# Patient Record
Sex: Male | Born: 1935
Health system: Southern US, Community
[De-identification: ages and names within clinical notes are randomized; demographics above are authoritative.]

## PROBLEM LIST (undated history)

## (undated) DIAGNOSIS — E785 Hyperlipidemia, unspecified: Secondary | ICD-10-CM

## (undated) DIAGNOSIS — I4510 Unspecified right bundle-branch block: Secondary | ICD-10-CM

## (undated) DIAGNOSIS — N400 Enlarged prostate without lower urinary tract symptoms: Secondary | ICD-10-CM

## (undated) DIAGNOSIS — I77810 Thoracic aortic ectasia: Secondary | ICD-10-CM

## (undated) DIAGNOSIS — I341 Nonrheumatic mitral (valve) prolapse: Secondary | ICD-10-CM

## (undated) DIAGNOSIS — D1430 Benign neoplasm of unspecified bronchus and lung: Secondary | ICD-10-CM

## (undated) DIAGNOSIS — H269 Unspecified cataract: Secondary | ICD-10-CM

## (undated) DIAGNOSIS — R002 Palpitations: Secondary | ICD-10-CM

## (undated) DIAGNOSIS — E7800 Pure hypercholesterolemia, unspecified: Secondary | ICD-10-CM

## (undated) DIAGNOSIS — I429 Cardiomyopathy, unspecified: Secondary | ICD-10-CM

## (undated) HISTORY — DX: Benign prostatic hyperplasia without lower urinary tract symptoms: N40.0

## (undated) HISTORY — DX: Palpitations: R00.2

## (undated) HISTORY — DX: Unspecified right bundle-branch block: I45.10

## (undated) HISTORY — DX: Nonrheumatic mitral (valve) prolapse: I34.1

## (undated) HISTORY — DX: Thoracic aortic ectasia: I77.810

## (undated) HISTORY — DX: Unspecified cataract: H26.9

## (undated) HISTORY — PX: CATARACT EXTRACTION, BILATERAL: SHX1313

## (undated) HISTORY — PX: VASECTOMY: SHX75

## (undated) HISTORY — DX: Benign neoplasm of unspecified bronchus and lung: D14.30

## (undated) HISTORY — PX: COSMETIC SURGERY: SHX468

## (undated) HISTORY — PX: BLEPHAROPLASTY: SUR158

## (undated) HISTORY — DX: Pure hypercholesterolemia, unspecified: E78.00

## (undated) HISTORY — DX: Hyperlipidemia, unspecified: E78.5

## (undated) HISTORY — DX: Cardiomyopathy, unspecified: I42.9

## (undated) HISTORY — PX: OTHER SURGICAL HISTORY: SHX169

## (undated) HISTORY — PX: SHOULDER ARTHROSCOPY: SHX128

## (undated) HISTORY — PX: INGUINAL HERNIA REPAIR: SUR1180

---

## 2004-11-04 ENCOUNTER — Ambulatory Visit: Payer: Self-pay | Admitting: Cardiology

## 2004-11-10 ENCOUNTER — Ambulatory Visit: Payer: Self-pay | Admitting: Cardiology

## 2004-12-16 ENCOUNTER — Ambulatory Visit (HOSPITAL_COMMUNITY): Admission: RE | Admit: 2004-12-16 | Discharge: 2004-12-16 | Payer: Self-pay | Admitting: Surgery

## 2004-12-16 ENCOUNTER — Encounter (INDEPENDENT_AMBULATORY_CARE_PROVIDER_SITE_OTHER): Payer: Self-pay | Admitting: *Deleted

## 2005-01-16 ENCOUNTER — Emergency Department (HOSPITAL_COMMUNITY): Admission: EM | Admit: 2005-01-16 | Discharge: 2005-01-16 | Payer: Self-pay | Admitting: Family Medicine

## 2005-11-14 ENCOUNTER — Ambulatory Visit: Payer: Self-pay | Admitting: Cardiology

## 2005-12-26 ENCOUNTER — Encounter: Payer: Self-pay | Admitting: Cardiology

## 2005-12-26 ENCOUNTER — Ambulatory Visit: Payer: Self-pay | Admitting: Cardiology

## 2005-12-26 ENCOUNTER — Ambulatory Visit: Payer: Self-pay

## 2006-02-28 ENCOUNTER — Ambulatory Visit: Payer: Self-pay | Admitting: Cardiology

## 2006-05-01 ENCOUNTER — Ambulatory Visit: Payer: Self-pay | Admitting: Cardiology

## 2006-06-08 ENCOUNTER — Encounter: Admission: RE | Admit: 2006-06-08 | Discharge: 2006-06-08 | Payer: Self-pay | Admitting: Orthopaedic Surgery

## 2006-11-14 ENCOUNTER — Ambulatory Visit: Payer: Self-pay | Admitting: Cardiology

## 2006-12-13 ENCOUNTER — Ambulatory Visit: Payer: Self-pay | Admitting: Cardiology

## 2006-12-13 ENCOUNTER — Ambulatory Visit: Payer: Self-pay

## 2006-12-13 LAB — CONVERTED CEMR LAB
ALT: 16 units/L (ref 0–53)
Cholesterol: 143 mg/dL (ref 0–200)
HDL: 47.3 mg/dL (ref >39.0)

## 2007-06-06 LAB — HM COLONOSCOPY

## 2007-08-08 ENCOUNTER — Ambulatory Visit: Payer: Self-pay | Admitting: Gastroenterology

## 2007-08-22 ENCOUNTER — Encounter: Payer: Self-pay | Admitting: Gastroenterology

## 2007-08-22 ENCOUNTER — Ambulatory Visit: Payer: Self-pay | Admitting: Gastroenterology

## 2007-11-18 ENCOUNTER — Ambulatory Visit: Payer: Self-pay | Admitting: Cardiology

## 2007-11-18 LAB — CONVERTED CEMR LAB
Alkaline Phosphatase: 58 units/L (ref 39–117)
Direct LDL: 165.3 mg/dL
Total Bilirubin: 1.3 mg/dL — ABNORMAL HIGH (ref 0.3–1.2)
Total CHOL/HDL Ratio: 5
Total Protein: 6.7 g/dL (ref 6.0–8.3)
VLDL: 14 mg/dL (ref 0–40)

## 2007-11-21 ENCOUNTER — Ambulatory Visit: Payer: Self-pay | Admitting: Cardiology

## 2008-01-01 ENCOUNTER — Encounter: Payer: Self-pay | Admitting: Cardiology

## 2008-01-01 ENCOUNTER — Ambulatory Visit: Payer: Self-pay

## 2008-01-01 ENCOUNTER — Ambulatory Visit: Payer: Self-pay | Admitting: Cardiology

## 2008-01-01 LAB — CONVERTED CEMR LAB
AST: 21 units/L (ref 0–37)
Alkaline Phosphatase: 63 units/L (ref 39–117)
HDL: 43.5 mg/dL (ref >39.0)
LDL Cholesterol: 77 mg/dL (ref 0–99)
Total Protein: 6.8 g/dL (ref 6.0–8.3)
Triglycerides: 49 mg/dL (ref 0–149)

## 2008-01-20 ENCOUNTER — Ambulatory Visit: Payer: Self-pay

## 2008-01-21 ENCOUNTER — Ambulatory Visit: Payer: Self-pay | Admitting: Cardiology

## 2008-09-23 ENCOUNTER — Telehealth: Payer: Self-pay | Admitting: Cardiology

## 2008-09-29 ENCOUNTER — Telehealth: Payer: Self-pay | Admitting: Cardiology

## 2008-09-30 ENCOUNTER — Ambulatory Visit: Payer: Self-pay | Admitting: Cardiology

## 2008-09-30 DIAGNOSIS — E7800 Pure hypercholesterolemia, unspecified: Secondary | ICD-10-CM | POA: Insufficient documentation

## 2008-10-02 ENCOUNTER — Telehealth: Payer: Self-pay | Admitting: Cardiology

## 2008-10-02 ENCOUNTER — Encounter (INDEPENDENT_AMBULATORY_CARE_PROVIDER_SITE_OTHER): Payer: Self-pay | Admitting: *Deleted

## 2008-10-02 LAB — CONVERTED CEMR LAB
AST: 21 units/L (ref 0–37)
Albumin: 3.7 g/dL (ref 3.5–5.2)
Alkaline Phosphatase: 63 units/L (ref 39–117)
Bilirubin, Direct: 0.2 mg/dL (ref 0.0–0.3)
Cholesterol: 133 mg/dL (ref 0–200)
Total Bilirubin: 1.3 mg/dL — ABNORMAL HIGH (ref 0.3–1.2)
Total Protein: 6.6 g/dL (ref 6.0–8.3)
Triglycerides: 53 mg/dL (ref 0.0–149.0)
VLDL: 10.6 mg/dL (ref 0.0–40.0)

## 2008-11-05 DIAGNOSIS — I059 Rheumatic mitral valve disease, unspecified: Secondary | ICD-10-CM | POA: Insufficient documentation

## 2008-11-05 DIAGNOSIS — I4510 Unspecified right bundle-branch block: Secondary | ICD-10-CM | POA: Insufficient documentation

## 2008-11-05 DIAGNOSIS — I080 Rheumatic disorders of both mitral and aortic valves: Secondary | ICD-10-CM | POA: Insufficient documentation

## 2008-11-05 DIAGNOSIS — R002 Palpitations: Secondary | ICD-10-CM | POA: Insufficient documentation

## 2008-11-05 DIAGNOSIS — E785 Hyperlipidemia, unspecified: Secondary | ICD-10-CM | POA: Insufficient documentation

## 2008-11-06 ENCOUNTER — Ambulatory Visit: Payer: Self-pay | Admitting: Cardiology

## 2008-11-06 DIAGNOSIS — I712 Thoracic aortic aneurysm, without rupture, unspecified: Secondary | ICD-10-CM | POA: Insufficient documentation

## 2008-11-06 DIAGNOSIS — R9430 Abnormal result of cardiovascular function study, unspecified: Secondary | ICD-10-CM | POA: Insufficient documentation

## 2008-11-06 LAB — CONVERTED CEMR LAB
AST: 24 units/L (ref 0–37)
Bilirubin, Direct: 0.2 mg/dL (ref 0.0–0.3)
HDL: 52.6 mg/dL (ref >39.00)
Total Bilirubin: 1.4 mg/dL — ABNORMAL HIGH (ref 0.3–1.2)
Total Protein: 6.9 g/dL (ref 6.0–8.3)
VLDL: 8.6 mg/dL (ref 0.0–40.0)

## 2009-05-14 ENCOUNTER — Telehealth: Payer: Self-pay | Admitting: Cardiology

## 2009-06-07 ENCOUNTER — Telehealth: Payer: Self-pay | Admitting: Cardiology

## 2009-06-10 ENCOUNTER — Encounter: Payer: Self-pay | Admitting: Cardiology

## 2009-09-28 ENCOUNTER — Telehealth (INDEPENDENT_AMBULATORY_CARE_PROVIDER_SITE_OTHER): Payer: Self-pay | Admitting: *Deleted

## 2009-11-22 ENCOUNTER — Ambulatory Visit: Payer: Self-pay | Admitting: Cardiology

## 2009-12-16 ENCOUNTER — Ambulatory Visit: Payer: Self-pay | Admitting: Cardiology

## 2009-12-16 ENCOUNTER — Ambulatory Visit: Payer: Self-pay

## 2009-12-16 ENCOUNTER — Ambulatory Visit (HOSPITAL_COMMUNITY): Admission: RE | Admit: 2009-12-16 | Discharge: 2009-12-16 | Payer: Self-pay | Admitting: Cardiology

## 2009-12-16 ENCOUNTER — Encounter: Payer: Self-pay | Admitting: Cardiology

## 2009-12-17 ENCOUNTER — Telehealth: Payer: Self-pay | Admitting: Cardiology

## 2009-12-24 LAB — CONVERTED CEMR LAB
HDL: 50.5 mg/dL (ref >39.00)
LDL Cholesterol: 63 mg/dL (ref 0–99)
Total Bilirubin: 1 mg/dL (ref 0.3–1.2)
VLDL: 7.8 mg/dL (ref 0.0–40.0)

## 2010-02-05 ENCOUNTER — Emergency Department (HOSPITAL_COMMUNITY): Admission: EM | Admit: 2010-02-05 | Discharge: 2010-02-05 | Payer: Self-pay | Admitting: Emergency Medicine

## 2010-07-05 NOTE — Progress Notes (Signed)
Summary: Records request from [facility]  Request for records received from [facility]. Request forwarded to [facility]. Dena Chavis  September 28, 2009 1:21 PM

## 2010-07-05 NOTE — Progress Notes (Signed)
Summary: lipitor/[facility]  Phone Note Call from Patient Call back at Home Phone [phone]   Reason for Call: Talk to Nurse Summary of Call: needs to speak to nurse again about lipitor, [facility] has no record of the nurses ph call, please call again giving them the pts ID # [id number] Ph # [phone] Initial call taken by: Migdalia Dk,  June 07, 2009 1:57 PM  Follow-up for Phone Call        spoke with [facility] again, pt given approval for lipitor. pt aware Deliah Goody, RN  June 07, 2009 2:40 PM

## 2010-07-05 NOTE — Assessment & Plan Note (Signed)
Summary: per check out/sf   CC:  pt complains of rapid heart rate and lightheadness.  History of Present Illness:  [name] is a pleasant  gentleman who I have seen in the past for palpitations, mildly dilated aortic root and family history of abdominal aortic aneurysm. Carotid dopplers were  performed on January 01, 2008.  He was found to have normal carotid arteries bilaterally.  An echocardiogram was performed on July 29 of 2009. His ejection fraction was felt to be reduced at 40-45%.  There was mild aortic root dilatation and measured 39-mm.  Because of his reduced LV function we subsequently scheduled him to have a Myoview.  This was performed on January 20, 2008.  His ejection fraction was calculated at 57%.  There was normal LV function and wall motion.  Dr. Gala Romney read the study and felt that there was a possible minimal reversible defect at the base suggestive of ischemia.  I have reviewed these images and felt that it is most likely normal.  We have therefore been treating medically. He had an abdominal ultrasound in July 2008 showed no abdominal aortic aneurysm.  I last saw him in June of 2010. Since then he has had 2 episodes of palpitations. These occurred when he was "hot". There were associated with presyncope. However there was no chest pain, shortness of breath or syncope. It lasted 10 minutes and resolve spontaneously. He otherwise has not had dyspnea on exertion, exertional chest pain, orthopnea, PND or pedal edema.  Current Medications (verified): 1)  Lipitor 40 Mg Tabs (Atorvastatin Calcium) .Marland Kitchen.. 1 Tab By Mouth Once Daily 2)  Lovaza 1 Gm Caps (Omega-3-Acid Ethyl Esters) .... 2 Tabs By Mouth Two Times A Day 3)  Aspirin 81 Mg Tbec (Aspirin) .... Take One Tablet By Mouth Daily 4)  Co Q-10 30 Mg  Caps (Coenzyme Q10) .Marland Kitchen.. 1 Tab  Mouth Two Times A Day 5)  Calcium 600/vitamin D 600-400 Mg-Unit Tabs (Calcium Carbonate-Vitamin D) .Marland Kitchen.. 1 Tab By Mouth Two Times A Day  Past History:  Past  Medical History: BUNDLE BRANCH BLOCK, RIGHT (ICD-426.4) HYPERLIPIDEMIA (ICD-272.4) MITRAL VALVE PROLAPSE (ICD-424.0) PALPITATIONS (ICD-785.1) PURE HYPERCHOLESTEROLEMIA (ICD-272.0) History of mildly dilated aortic root  Past Surgical History: Reviewed history from 11/06/2008 and no changes required.  Excision of a 2 cm soft tissue mass of left neck. History of resection of right lung secondary to benign tumor.  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  Alcohol Use - no  Review of Systems       no fevers or chills, productive cough, hemoptysis, dysphasia, odynophagia, melena, hematochezia, dysuria, hematuria, rash, seizure activity, orthopnea, PND, pedal edema, claudication. Remaining systems are negative.   Vital Signs:  Patient profile:   77 year old male Height:      73 inches Weight:      173 pounds BMI:     22.91 Pulse rate:   61 / minute Resp:     14 per minute BP sitting:   102 / 58  (left arm)  Vitals Entered By: Kem Parkinson (November 22, 2009 8:54 AM)  Physical Exam  General:  Well-developed well-nourished in no acute distress.  Skin is warm and dry.  HEENT is normal.  Neck is supple. No thyromegaly.  Chest is clear to auscultation with normal expansion.  Cardiovascular exam is regular rate and rhythm. 1/6 systolic murmur at the apex. 1/6 systolic ejection murmur at the left sternal border. Abdominal exam  nontender or distended. No masses palpated. Extremities show no edema. neuro grossly intact    Impression & Recommendations:  Problem # 1:  THORACIC AORTIC ANEURYSM (ICD-441.2) Plan repeat echocardiogram. Previous echo showed a diameter of 3.9 cm.  Problem # 2:  HYPERLIPIDEMIA (ICD-272.4) Continue present medications. Check lipids and liver. His updated medication list for this problem includes:    Lipitor 40 Mg Tabs (Atorvastatin calcium) .Marland Kitchen... 1 tab by mouth once daily    Lovaza 1 Gm Caps  (Omega-3-acid ethyl esters) .Marland Kitchen... 2 tabs by mouth two times a day  Orders: TLB-Lipid Panel (80061-LIPID) TLB-Hepatic/Liver Function Pnl (80076-HEPATIC)  Problem # 3:  PALPITATIONS (ICD-785.1) Will treat conservatively at this point. If he has and more frequent episodes in the future will plan cardiac monitor. His updated medication list for this problem includes:    Aspirin 81 Mg Tbec (Aspirin) .Marland Kitchen... Take one tablet by mouth daily  Orders: Echocardiogram (Echo)  Problem # 4:  CARDIOVASCULAR STUDIES, ABNORMAL (ICD-794.30) I reviewed this previously and felt it was most likely normal. No chest pain. No further evaluation at this time.  Problem # 5:  BUNDLE BRANCH BLOCK, RIGHT (ICD-426.4)  His updated medication list for this problem includes:    Aspirin 81 Mg Tbec (Aspirin) .Marland Kitchen... Take one tablet by mouth daily  Problem # 6:  MITRAL VALVE PROLAPSE (ICD-424.0)  Patient Instructions: 1)  Your physician recommends that you schedule a follow-up appointment in: ONE YEAR 2)  Your physician has requested that you have an echocardiogram.  Echocardiography is a painless test that uses sound waves to create images of your heart. It provides your doctor with information about the size and shape of your heart and how well your heart's chambers and valves are working.  This procedure takes approximately one hour. There are no restrictions for this procedure.

## 2010-07-05 NOTE — Medication Information (Signed)
Summary: prior authorization  prior authorization   Imported By: Kassie Mends 06/30/2009 14:03:38  _____________________________________________________________________  External Attachment:    Type:   Image     Comment:   External Document

## 2010-07-05 NOTE — Progress Notes (Signed)
Summary: refill meds  Phone Note Refill Request Call back at Home Phone [phone] Message from:  Patient on December 17, 2009 2:44 PM  Refills Requested: Medication #1:  LIPITOR 40 MG TABS 1 tab by mouth once daily [facility] in [place]    Method Requested: Fax to Local Pharmacy Initial call taken by: Lorne Skeens,  December 17, 2009 2:44 PM Caller: Patient    Prescriptions: LIPITOR 40 MG TABS (ATORVASTATIN CALCIUM) 1 tab by mouth once daily  #30 x 12   Entered by:   Kem Parkinson   Authorized by:   Ferman Hamming, MD, [facility]   Signed by:   Kem Parkinson on 12/17/2009   Method used:   Electronically to        [facility] [address] [phone]* (retail)       [address] [place], [place]  [address]       Ph: [phone]       Fax: [phone]   RxID:   [id number]

## 2010-08-18 LAB — POCT I-STAT, CHEM 8
BUN: 24 mg/dL — ABNORMAL HIGH (ref 6–23)
Chloride: 106 mEq/L (ref 96–112)
Creatinine, Ser: 1 mg/dL (ref 0.4–1.5)
Glucose, Bld: 146 mg/dL — ABNORMAL HIGH (ref 70–99)
Potassium: 3.8 mEq/L (ref 3.5–5.1)
TCO2: 26 mmol/L (ref 0–100)

## 2010-08-18 LAB — POCT CARDIAC MARKERS
CKMB, poc: <1 ng/mL — ABNORMAL LOW (ref 1.0–8.0)
Myoglobin, poc: 45.4 ng/mL (ref 12–200)
Troponin i, poc: <0.05 ng/mL (ref 0.00–0.09)

## 2010-09-26 ENCOUNTER — Telehealth: Payer: Self-pay | Admitting: Cardiology

## 2010-09-26 NOTE — Telephone Encounter (Signed)
Pt wants to know if he needs to have blood work before dr appt

## 2010-09-26 NOTE — Telephone Encounter (Signed)
Spoke with pt wife, she states his primary follows his cholestrol. No labs needed prior to appt [name] [name]

## 2010-10-18 NOTE — Assessment & Plan Note (Signed)
[facility]                            CARDIOLOGY OFFICE NOTE   NAME:[name] [name], [name] [name]                  MRN:          [id number]  DATE:11/21/2007                            DOB:          06-29-1930     Mr. Colberg is a very pleasant 77 year old gentleman who has a history of  palpitations, mildly dilated aortic root, and a family history of  abdominal aortic aneurysm.  When I last saw him, we did repeat his  abdominal ultrasound.  This was performed on December 13, 2006 and showed no  evidence of an aneurysm.  His most recent echocardiogram in July 2007  showed low normal LV function with an ejection fraction of 50%.  There  was mild aortic root dilatation measuring 40.1 mm.  There was trivial  mitral regurgitation.  Note, there is no mention of mitral valve  prolapse, which had been seen on previous echocardiograms.  There was a  small effusion.  Since I last saw him, he has dyspnea with more extreme  activities, but not with routine activities.  There is no orthopnea,  PND, pedal edema, palpitations, presyncope, syncope, or chest pain.   MEDICATIONS:  1. Aspirin 81 mg daily.  2. Omega-3.  3. Calcium.   PHYSICAL EXAMINATION:  Today shows a blood pressure of 117/67, his pulse  is 56, and weight is 172 pounds.  HEENT:  Normal.  NECK:  Supple.  There is a right carotid bruit noted.  The upstroke is  normal.  CHEST:  Clear.  CARDIOVASCULAR:  Regular rate and rhythm.  There are no murmurs noted.  ABDOMEN:  No tenderness.  EXTREMITIES:  No edema.   His electrocardiogram shows a sinus bradycardia at a rate of 52.  There  is a right bundle-branch block.  I do have cholesterol result from November 18, 2007.  His total cholesterol was 225 with an LDL of 165.   DIAGNOSES:  1. History of mildly dilated aortic root - we will plan to repeat his      echocardiogram to reassess his aortic root as well as his mitral      valve.  2. History of mitral valve  prolapse - as per #1.  3. Hyperlipidemia - his LDL has increased off Lipitor.  He did state      he had mild arthralgias on Lipitor previously, but there were      tolerable.  We will resume at 20 mg p.o. daily and we will check      lipids and liver in 6 weeks and adjust as indicated.  4. Right carotid bruit - he will need carotid Dopplers.  5. Right bundle-branch block.  6. Family history of abdominal aortic aneurysm - his followup      ultrasound showed no aneurysm.   We will see him back in 1 year.     Madolyn Frieze Jens Som, MD, [facility]  Electronically Signed    BSC/MedQ  DD: 11/21/2007  DT: 11/21/2007  Job #: [id number]

## 2010-10-18 NOTE — Assessment & Plan Note (Signed)
[facility]                            CARDIOLOGY OFFICE NOTE   NAME:[name] [name] STUCK                  MRN:          [id number]  DATE:01/21/2008                            DOB:          [birth year]    HISTORY OF PRESENT ILLNESS:  [name] [name] is a pleasant 77 year old  gentleman who I have seen in the past for palpitations, mildly dilated  aortic root and family history of abdominal aortic aneurysm.  When I  last saw him, we scheduled him to have carotid Dopplers due to bruits.  This was performed on January 01, 2008.  He was found to have normal  coronary arteries bilaterally.  We also scheduled him to have an  echocardiogram to follow up on his mildly dilated aortic root.  His  ejection fraction was felt to be reduced at 40-45%.  There was mild  aortic root dilatation and measured 39-mm.  Because of his reduced LV  function we subsequently scheduled him to have a Myoview.  This was  performed on January 20, 2008.  His ejection fraction was calculated at  57%.  There was normal LV function and wall motion.  Dr. Gala Romney read  the study and felt that there was a possible minimal reversible defect  at the base suggestive of ischemia.  I have reviewed these images and  felt that it is most likely normal.  Since he was seen previously,  he  denies any chest pain, dyspnea, palpitations, or syncope.  There is no  pedal edema.   MEDICATIONS:  His medications at present include  1. Aspirin 81 mg daily p.o. daily.  2. Omega-3.  3. Vitamin.  4. Calcium.  5. Lipitor 20 mg p.o. daily.   PHYSICAL EXAMINATION:  VITAL SIGNS:  Today shows a blood pressure of  113/69, his pulse is 61.  He weighs 170 pounds.  HEENT:  Normal.  NECK:  Supple.  CHEST:  Clear.  CARDIOVASCULAR:  Regular rate and rhythm.  ABDOMEN:  Shows no tenderness.  EXTREMITIES:  Show no edema.   DIAGNOSES:  1. History of mildly dilated aortic root - His followup echocardiogram      showed that  his aortic root measures 39 mm.  I will continue to      follow this in the future.  2. History of mitral valve prolapse - His recent echocardiogram did      not suggest mitral valve prolapse.  3. Hyperlipidemia.  He continue on his statin.  4. Right bundle-branch block.  5. Family history of abdominal aortic aneurysm - His followup      ultrasound previously showed no aneurysm.  6. Recent reduced left ventricular function on echocardiogram - His      followup Myoview showed      no ischemia to my review and also normal left ventricular function.      We will not pursue this further.  I will see him back in 1-year.     Madolyn Frieze Jens Som, MD, [facility]  Electronically Signed    BSC/MedQ  DD: 01/21/2008  DT: 01/21/2008  Job #: [id number]

## 2010-10-18 NOTE — Assessment & Plan Note (Signed)
[facility]                            CARDIOLOGY OFFICE NOTE   NAME:[name], [name] [name]                  MRN:          [id number]  DATE:11/14/2006                            DOB:          03/03/1931    [name] is a pleasant 77 year old gentleman who has a history of  palpitations, mildly dilated aortic ___________ and a history of  abdominal aortic aneurysm.  Since I last saw him he is doing well.  He  has dyspnea with more extreme activities, but not with routine  activities around the house.  There is no orthopnea, PND, pedal edema,  palpitations, presyncope, syncope or exertional chest pain.   His medications include:  1. Lipitor 20 mg p.o. daily.  2. Aspirin 81 mg p.o. daily.  3. Omega 3 vitamin.   PHYSICAL EXAMINATION:  Today shows a blood pressure of 115/74 and his  pulse is 69.  His HEENT is normal.  His neck is supple with a normal upstroke bilaterally.  There are no  bruits noted.  His chest is clear to auscultation.  His cardiovascular exam shows a regular rate and rhythm.  I cannot  appreciate a diastolic murmur.  His abdominal exam shows tenderness in the midabdomen with deep  palpation.  There is no bruits noted.  His extremities show no edema.   His electrocardiogram shows a sinus rhythm at a rate of 66.  There is a  right bundle branch block.   DIAGNOSES:  1. History of mildly dilated aortic root - note his most recent      echocardiogram on December 26, 2005 showed a normal left ventricular      function with an ejection fraction of 50%.  His aortic root was      mildly dilated and measured 40 mm.  We will most likely repeat this      in 1 year when he returns for followup.  2. History of mild mitral valve prolapse.  3. Hyperlipidemia - when he returns for an abdominal ultrasound he      will have lipids and liver drawn and we will adjust his medications      as indicated.  4. Right bundle branch block.  5. Family history  of abdominal aortic aneurysm - it has been 5 years      since his previous ultrasound and there is mild pain with deep      palpation of his abdomen.  We will repeat his ultrasound to make      sure he has not developed an aneurysm.   We will see him back in 1 year.     [name] [name] [name] Som, MD, [facility]  Electronically Signed    BSC/MedQ  DD: 11/14/2006  DT: 11/14/2006  Job #: [id number]

## 2010-10-21 NOTE — Op Note (Signed)
NAME:  [name] [name], [name] [name]               ACCOUNT NO.:  [id number]   MEDICAL RECORD NO.:  [id number]          PATIENT TYPE:  OIB   LOCATION:  2899                         FACILITY:  [facility]   PHYSICIAN:  Velora Heckler, MD      DATE OF BIRTH:  [birth year]   DATE OF PROCEDURE:  12/16/2004  DATE OF DISCHARGE:                                 OPERATIVE REPORT   PREOPERATIVE DIAGNOSIS:  Soft tissue mass of left neck.   POSTOPERATIVE DIAGNOSIS:  Soft tissue mass of left neck.   PROCEDURE:  Excision of a 2 cm soft tissue mass of left neck.   SURGEON:  Velora Heckler, MD   ANESTHESIA:  1% lidocaine with epinephrine.   ESTIMATED BLOOD LOSS:  Minimal.   PREPARATION:  Betadine.   COMPLICATIONS:  None.   INDICATIONS:  The patient presents at the request of Dr. Nita Sells, Montez Hageman. for  evaluation of soft tissue mass of left anterior neck. This been present for  number of years and gradually increasing in size. The patient desires  excision. He has had a previous history of skin cancer.   DESCRIPTION OF PROCEDURE:  The procedure was done in the minor procedure  room at the [facility] at [facility]. The patient is placed  in the supine position on the operating room table. After prepping and  draping in the normal aseptic fashion, the skin was anesthetized with local  anesthetic with epinephrine. An elliptical incision was made a #15 blade.  Dissection was carried into the subcutaneous tissues. The mass was excised  sharply.  It was submitted to pathology for review. Hemostasis was by direct pressure.  Skin edges were reapproximated with interrupted 4-0 Vicryl subcuticular  sutures. The wound was washed and dried and then Steri-Strips were applied.  Sterile dressings were applied. The patient tolerated the procedure well.       TMG/MEDQ  D:  12/16/2004  T:  12/16/2004  Job:  [id number]   cc:   Mertha Finders., M.D.  [id number] [place]  [place] [address]  Fax: [phone]

## 2010-11-07 ENCOUNTER — Encounter: Payer: Self-pay | Admitting: Cardiology

## 2010-11-24 ENCOUNTER — Encounter: Payer: Self-pay | Admitting: Cardiology

## 2010-11-24 ENCOUNTER — Encounter: Payer: Self-pay | Admitting: *Deleted

## 2010-11-24 ENCOUNTER — Ambulatory Visit (INDEPENDENT_AMBULATORY_CARE_PROVIDER_SITE_OTHER): Payer: Medicare Other | Admitting: Cardiology

## 2010-11-24 DIAGNOSIS — I712 Thoracic aortic aneurysm, without rupture, unspecified: Secondary | ICD-10-CM

## 2010-11-24 DIAGNOSIS — E7800 Pure hypercholesterolemia, unspecified: Secondary | ICD-10-CM

## 2010-11-24 DIAGNOSIS — R002 Palpitations: Secondary | ICD-10-CM

## 2010-11-24 DIAGNOSIS — R9430 Abnormal result of cardiovascular function study, unspecified: Secondary | ICD-10-CM

## 2010-11-24 LAB — LIPID PANEL
Cholesterol: 143 mg/dL (ref 0–200)
Triglycerides: 30 mg/dL (ref 0.0–149.0)

## 2010-11-24 LAB — HEPATIC FUNCTION PANEL
Total Bilirubin: 1.3 mg/dL — ABNORMAL HIGH (ref 0.3–1.2)
Total Protein: 6.7 g/dL (ref 6.0–8.3)

## 2010-11-24 NOTE — Progress Notes (Signed)
HPI:  [name] is a pleasant  gentleman who I have seen in the past for palpitations, mildly dilated aortic root and family history of abdominal aortic aneurysm. Carotid dopplers were  performed on January 01, 2008.  He was found to have normal carotid arteries bilaterally.  An echocardiogram was performed in July of 2011. His ejection fraction was felt to be reduced at 50-55%; small pericardial effusion; mild RAE and RVE.  There was mild aortic root dilatation and measured 42-mm.  Myoview on January 20, 2008 showed an ejection fraction of 57%.  There was normal LV function and wall motion.  Dr. Gala Romney read the study and felt that there was a possible minimal reversible defect at the base suggestive of ischemia.  I have reviewed these images and felt that it is most likely normal.  We have therefore been treating medically. He had an abdominal ultrasound in July 2008 showed no abdominal aortic aneurysm.  I last saw him in June of 2011. Since then he has had 1episodes of palpitations. These occurred after drinking caffeine. He has had no chest pain, shortness of breath or syncope. It lasted 10 minutes and resolve spontaneously.   Current Outpatient Prescriptions  Medication Sig Dispense Refill  . aspirin 81 MG EC tablet Take 81 mg by mouth daily.        Marland Kitchen atorvastatin (LIPITOR) 40 MG tablet Take 40 mg by mouth daily.        . Calcium Carbonate-Vitamin D (CALCIUM 600/VITAMIN D) 600-400 MG-UNIT per tablet Take 1 tablet by mouth 2 (two) times daily.        Marland Kitchen co-enzyme Q-10 30 MG capsule Take 30 mg by mouth 2 (two) times daily.        Marland Kitchen omega-3 acid ethyl esters (LOVAZA) 1 G capsule Take 2 g by mouth 2 (two) times daily.           Past Medical History  Diagnosis Date  . RBBB (right bundle branch block)   . Hyperlipidemia   . Mitral valve prolapse   . Palpitations   . Pure hypercholesterolemia   . Dilated aortic root     Hx of mild    Past Surgical History  Procedure Date  . Excision on neck    Excision of 2 cm soft tissue mass of left neck  . Resection of lung     Hx of, right lung secondary to benign tumor    History   Social History  . Marital Status: Married    Spouse Name: N/A    Number of Children: N/A  . Years of Education: N/A   Occupational History  . [employer]    Social History Main Topics  . Smoking status: Former Games developer  . Smokeless tobacco: Not on file  . Alcohol Use: No  . Drug Use: Not on file  . Sexually Active: Not on file   Other Topics Concern  . Not on file   Social History Narrative  . No narrative on file    ROS: no fevers or chills, productive cough, hemoptysis, dysphasia, odynophagia, melena, hematochezia, dysuria, hematuria, rash, seizure activity, orthopnea, PND, pedal edema, claudication. Remaining systems are negative.  Physical Exam: Well-developed well-nourished in no acute distress.  Skin is warm and dry.  HEENT is normal.  Neck is supple. No thyromegaly.  Chest is clear to auscultation with normal expansion.  Cardiovascular exam is regular rate and rhythm.  Abdominal exam nontender or distended. No masses palpated. Extremities show no edema. neuro  grossly intact  ECG Normal sinus rhythm at a rate of 65. Right bundle branch block.

## 2010-11-24 NOTE — Patient Instructions (Signed)
Your physician wants you to follow-up in: ONE YEAR  You will receive a reminder letter in the mail two months in advance. If you don't receive a letter, please call our office to schedule the follow-up appointment.   Your physician has requested that you have an echocardiogram. Echocardiography is a painless test that uses sound waves to create images of your heart. It provides your doctor with information about the size and shape of your heart and how well your heart's chambers and valves are working. This procedure takes approximately one hour. There are no restrictions for this procedure.   Your physician recommends that you return for lab work in: TODAY  

## 2010-11-24 NOTE — Assessment & Plan Note (Signed)
Not particularly symptomatic. Continue to monitor.

## 2010-11-24 NOTE — Assessment & Plan Note (Signed)
I previously felt normal or certainly low risk. No chest pain. Continue medical therapy.

## 2010-11-24 NOTE — Assessment & Plan Note (Signed)
Repeat echocardiogram. 

## 2010-11-24 NOTE — Assessment & Plan Note (Signed)
Continue statin. Check lipids and liver. 

## 2010-12-05 ENCOUNTER — Ambulatory Visit (HOSPITAL_COMMUNITY): Payer: Medicare Other | Attending: Emergency Medicine

## 2010-12-05 DIAGNOSIS — I059 Rheumatic mitral valve disease, unspecified: Secondary | ICD-10-CM | POA: Insufficient documentation

## 2010-12-05 DIAGNOSIS — I079 Rheumatic tricuspid valve disease, unspecified: Secondary | ICD-10-CM | POA: Insufficient documentation

## 2010-12-05 DIAGNOSIS — I319 Disease of pericardium, unspecified: Secondary | ICD-10-CM | POA: Insufficient documentation

## 2010-12-05 DIAGNOSIS — I712 Thoracic aortic aneurysm, without rupture, unspecified: Secondary | ICD-10-CM

## 2010-12-05 DIAGNOSIS — I379 Nonrheumatic pulmonary valve disorder, unspecified: Secondary | ICD-10-CM | POA: Insufficient documentation

## 2011-01-04 ENCOUNTER — Other Ambulatory Visit: Payer: Self-pay | Admitting: Cardiology

## 2011-08-25 ENCOUNTER — Encounter: Payer: Self-pay | Admitting: *Deleted

## 2011-09-05 ENCOUNTER — Encounter: Payer: Self-pay | Admitting: Emergency Medicine

## 2011-09-05 ENCOUNTER — Ambulatory Visit (INDEPENDENT_AMBULATORY_CARE_PROVIDER_SITE_OTHER): Payer: Medicare Other | Admitting: Emergency Medicine

## 2011-09-05 VITALS — BP 120/74 | HR 65 | Temp 97.7°F | Resp 12 | Ht 72.0 in | Wt 173.2 lb

## 2011-09-05 DIAGNOSIS — E785 Hyperlipidemia, unspecified: Secondary | ICD-10-CM

## 2011-09-05 DIAGNOSIS — J301 Allergic rhinitis due to pollen: Secondary | ICD-10-CM

## 2011-09-05 DIAGNOSIS — Z0000 Encounter for general adult medical examination without abnormal findings: Secondary | ICD-10-CM

## 2011-09-05 DIAGNOSIS — Z9109 Other allergy status, other than to drugs and biological substances: Secondary | ICD-10-CM

## 2011-09-05 DIAGNOSIS — E782 Mixed hyperlipidemia: Secondary | ICD-10-CM

## 2011-09-05 LAB — POCT URINALYSIS DIPSTICK
Blood, UA: NEGATIVE
Leukocytes, UA: NEGATIVE
Protein, UA: NEGATIVE
Urobilinogen, UA: 0.2

## 2011-09-05 LAB — CBC WITH DIFFERENTIAL/PLATELET
Basophils Relative: 0 % (ref 0–1)
Eosinophils Absolute: 0.1 10*3/uL (ref 0.0–0.7)
HCT: 47.4 % (ref 39.0–52.0)
MCV: 92.4 fL (ref 78.0–100.0)
Monocytes Relative: 8 % (ref 3–12)
Neutro Abs: 3.5 10*3/uL (ref 1.7–7.7)
Neutrophils Relative %: 67 % (ref 43–77)
RBC: 5.13 MIL/uL (ref 4.22–5.81)
WBC: 5.3 10*3/uL (ref 4.0–10.5)

## 2011-09-05 LAB — COMPREHENSIVE METABOLIC PANEL
AST: 23 U/L (ref 0–37)
Alkaline Phosphatase: 75 U/L (ref 39–117)
Calcium: 10.4 mg/dL (ref 8.4–10.5)
Creat: 1.03 mg/dL (ref 0.50–1.35)
Glucose, Bld: 89 mg/dL (ref 70–99)
Potassium: 4.4 mEq/L (ref 3.5–5.3)
Total Protein: 7 g/dL (ref 6.0–8.3)

## 2011-09-05 LAB — LIPID PANEL
Cholesterol: 137 mg/dL (ref 0–200)
LDL Cholesterol: 67 mg/dL (ref 0–99)
Triglycerides: 62 mg/dL (ref <150)

## 2011-09-05 LAB — POCT UA - MICROSCOPIC ONLY: Yeast, UA: NEGATIVE

## 2011-09-05 MED ORDER — FLUTICASONE PROPIONATE 50 MCG/ACT NA SUSP: 2.0000 | Freq: Every day | NASAL | Status: DC

## 2011-09-05 NOTE — Progress Notes (Signed)
  Subjective:    Patient ID: [name] [name], male    DOB: 04-12-1931, 78 y.o.   MRN: [id number]  HPI patient enters for his annual physical exam. He is followed closely by his cardiologist and also by GI doctor. He has regular checkups. His no specific complaints today just in for his regular physical exam    Review of Systems  Constitutional: Negative.   HENT: Negative.        He has allergic rhinitis and uses Flonase spray for this  Eyes:       Patient does wear glasses  Respiratory:       He is status post surgery with right thoracotomy removal of benign lesion right lung  Cardiovascular:       He sees cardiologist regular for followup  Genitourinary:       He has a history of an enlarged prostate and has seen the urologist in the past  Musculoskeletal:       He has some pain in his shoulders. This is an ongoing problem for him  Neurological: Negative.   Hematological: Negative.   Psychiatric/Behavioral: Negative.        Objective:   Physical Exam  Constitutional: He appears well-developed and well-nourished.  HENT:  Head: Normocephalic.  Right Ear: External ear normal.  Left Ear: External ear normal.  Eyes: EOM are normal. Pupils are equal, round, and reactive to light.  Neck: Normal range of motion. No JVD present. No tracheal deviation present. No thyromegaly present.  Cardiovascular: Normal rate, regular rhythm and normal heart sounds.  Exam reveals no gallop and no friction rub.   No murmur heard. Pulmonary/Chest: No respiratory distress. He has no wheezes. He has rales. He exhibits no tenderness.       He has some dry rales audible left lower lung. He has a right thoracotomy scar  Abdominal: Soft. He exhibits no distension and no mass. There is no tenderness. There is no rebound and no guarding.  Genitourinary: Guaiac negative stool.       Prostate is slightly enlarged asymmetrical to the right  Musculoskeletal: Normal range of motion.  Lymphadenopathy:    He  has no cervical adenopathy.  Neurological: He is alert. No cranial nerve deficit. Coordination normal.  Skin:       His red scaly skin over his forehead consistent with seborrheic dermatitis.          Assessment & Plan:   Patient initially yearly physical. He actually is doing very well at home no major complaints at the present time

## 2011-09-06 LAB — PSA, MEDICARE: PSA: 0.36 ng/mL (ref <=4.00)

## 2011-09-29 ENCOUNTER — Other Ambulatory Visit: Payer: Self-pay | Admitting: Cardiology

## 2011-12-05 ENCOUNTER — Telehealth: Payer: Self-pay | Admitting: Cardiology

## 2011-12-05 NOTE — Telephone Encounter (Signed)
New Problem:   Patient wanted to know if he could come earlier to have his blood drawn for his appointment tomorrow.  Please call back.

## 2011-12-05 NOTE — Telephone Encounter (Signed)
Spoke with pt, advised to wait until after the appt for labs incase there is some thing dr Jens Som wants checked after seeing him. Pt agreed.

## 2011-12-06 ENCOUNTER — Ambulatory Visit (INDEPENDENT_AMBULATORY_CARE_PROVIDER_SITE_OTHER): Payer: Medicare Other | Admitting: Cardiology

## 2011-12-06 ENCOUNTER — Encounter: Payer: Self-pay | Admitting: Cardiology

## 2011-12-06 VITALS — BP 110/64 | HR 62 | Ht 73.0 in | Wt 174.0 lb

## 2011-12-06 DIAGNOSIS — R002 Palpitations: Secondary | ICD-10-CM

## 2011-12-06 DIAGNOSIS — I071 Rheumatic tricuspid insufficiency: Secondary | ICD-10-CM

## 2011-12-06 DIAGNOSIS — E7800 Pure hypercholesterolemia, unspecified: Secondary | ICD-10-CM

## 2011-12-06 DIAGNOSIS — I079 Rheumatic tricuspid valve disease, unspecified: Secondary | ICD-10-CM

## 2011-12-06 DIAGNOSIS — I77810 Thoracic aortic ectasia: Secondary | ICD-10-CM

## 2011-12-06 LAB — BASIC METABOLIC PANEL
Chloride: 102 mEq/L (ref 96–112)
GFR: 65.86 mL/min (ref >60.00)
Glucose, Bld: 100 mg/dL — ABNORMAL HIGH (ref 70–99)
Potassium: 4.1 mEq/L (ref 3.5–5.1)
Sodium: 140 mEq/L (ref 135–145)

## 2011-12-06 NOTE — Progress Notes (Signed)
HPI: [name] is a pleasant gentleman who I have seen in the past for palpitations, mildly dilated aortic root and family history of abdominal aortic aneurysm. Carotid dopplers were performed on January 01, 2008. He was found to have normal carotid arteries bilaterally. An echocardiogram was performed in July of 2012. His ejection fraction was felt to be 50-55%; small pericardial effusion; mild RAE and moderate RVE; moderate TR. There was mild aortic root dilatation and measured 42-mm. Myoview on January 20, 2008 showed an ejection fraction of 57%. There was normal LV function and wall motion. Dr. Gala [name] read the study and felt that there was a possible minimal reversible defect at the base suggestive of ischemia. I have reviewed these images and felt that it is most likely normal. We have therefore been treating medically. He had an abdominal ultrasound in July 2008 showed no abdominal aortic aneurysm. I last saw him in June of 2012. Since then the patient has dyspnea with more extreme activities but not with routine activities. It is relieved with rest. It is not associated with chest pain. There is no orthopnea, PND or pedal edema. There is no syncope or palpitations. There is no exertional chest pain.    Current Outpatient Prescriptions  Medication Sig Dispense Refill  . aspirin 81 MG EC tablet Take 81 mg by mouth daily.        Marland Kitchen atorvastatin (LIPITOR) 40 MG tablet TAKE 1 TABLET BY MOUTH EVERY DAY  30 tablet  6  . Calcium Carbonate-Vitamin D (CALCIUM 600/VITAMIN D) 600-400 MG-UNIT per tablet Take 1 tablet by mouth 2 (two) times daily.        Marland Kitchen co-enzyme Q-10 30 MG capsule Take 30 mg by mouth 2 (two) times daily.        . fish oil-omega-3 fatty acids 1000 MG capsule Take 2 g by mouth daily. Nordic naturals omega 3 TID      . fluticasone (FLONASE) 50 MCG/ACT nasal spray Place 2 sprays into the nose daily.  16 g  11  . Polyethyl Glycol-Propyl Glycol (SYSTANE OP) Apply to eye as needed. And systane  gel drops at night         Past Medical History  Diagnosis Date  . RBBB (right bundle branch block)   . Hyperlipidemia   . Mitral valve prolapse     irregular heart beat  . Palpitations   . Pure hypercholesterolemia   . Dilated aortic root     Hx of mild  . Benign tumor of lung   . BPH (benign prostatic hyperplasia)     Past Surgical History  Procedure Date  . Excision on neck     Excision of 2 cm soft tissue mass of left neck  . Resection of lung     Hx of, right lung secondary to benign tumor  . Testicular hernia   . Shoulder arthroscopy     Lt    History   Social History  . Marital Status: Married    Spouse Name: N/A    Number of Children: N/A  . Years of Education: N/A   Occupational History  . [name] [name]    Social History Main Topics  . Smoking status: Former Games developer  . Smokeless tobacco: Not on file   Comment: quit 80's  . Alcohol Use: No  . Drug Use: No  . Sexually Active: Not on file   Other Topics Concern  . Not on file   Social History Narrative   Exercise 20-30  min walks.    ROS: no fevers or chills, productive cough, hemoptysis, dysphasia, odynophagia, melena, hematochezia, dysuria, hematuria, rash, seizure activity, orthopnea, PND, pedal edema, claudication. Remaining systems are negative.  Physical Exam: Well-developed well-nourished in no acute distress.  Skin is warm and dry.  HEENT is normal.  Neck is supple.  Chest is clear to auscultation with normal expansion.  Cardiovascular exam is regular rate and rhythm. 2/6 systolic murmur left sternal border Abdominal exam nontender or distended. No masses palpated. Extremities show no edema. neuro grossly intact  ECG sinus rhythm at a rate of 62. Right bundle branch block. Cannot rule out prior septal infarct.

## 2011-12-06 NOTE — Assessment & Plan Note (Signed)
Plan repeat echocardiogram. Right side enlargement and tricuspid regurgitation made be related to previous lung resection and pulmonary disease.

## 2011-12-06 NOTE — Assessment & Plan Note (Signed)
Continue statin. Check lipids and liver. 

## 2011-12-06 NOTE — Assessment & Plan Note (Signed)
Plain CTA of the chest to size.

## 2011-12-06 NOTE — Patient Instructions (Addendum)
Your physician wants you to follow-up in: ONE YEAR WITH DR Shelda Pal will receive a reminder letter in the mail two months in advance. If you don't receive a letter, please call our office to schedule the follow-up appointment.   Your physician recommends that you HAVE LAB WORK TODAY  Your physician has requested that you have an echocardiogram. Echocardiography is a painless test that uses sound waves to create images of your heart. It provides your doctor with information about the size and shape of your heart and how well your heart's chambers and valves are working. This procedure takes approximately one hour. There are no restrictions for this procedure.   Non-Cardiac CT Angiography (CTA), is a special type of CT scan that uses a computer to produce multi-dimensional views of major blood vessels throughout the body. In CT angiography, a contrast material is injected through an IV to help visualize the blood vessels CTA OF CHEST W AND W/O CONTRAST FOR DILATED AORTIC ROOT

## 2011-12-13 ENCOUNTER — Ambulatory Visit (HOSPITAL_COMMUNITY): Payer: Medicare Other | Attending: Cardiovascular Disease

## 2011-12-13 ENCOUNTER — Other Ambulatory Visit (INDEPENDENT_AMBULATORY_CARE_PROVIDER_SITE_OTHER): Payer: Medicare Other

## 2011-12-13 ENCOUNTER — Ambulatory Visit (INDEPENDENT_AMBULATORY_CARE_PROVIDER_SITE_OTHER)
Admission: RE | Admit: 2011-12-13 | Discharge: 2011-12-13 | Disposition: A | Discharge status: Home / Self Care | Payer: Medicare Other | Source: Ambulatory Visit | Attending: Cardiology | Admitting: Cardiology

## 2011-12-13 DIAGNOSIS — I059 Rheumatic mitral valve disease, unspecified: Secondary | ICD-10-CM | POA: Insufficient documentation

## 2011-12-13 DIAGNOSIS — I369 Nonrheumatic tricuspid valve disorder, unspecified: Secondary | ICD-10-CM

## 2011-12-13 DIAGNOSIS — R002 Palpitations: Secondary | ICD-10-CM | POA: Insufficient documentation

## 2011-12-13 DIAGNOSIS — I4510 Unspecified right bundle-branch block: Secondary | ICD-10-CM | POA: Insufficient documentation

## 2011-12-13 DIAGNOSIS — I517 Cardiomegaly: Secondary | ICD-10-CM | POA: Insufficient documentation

## 2011-12-13 DIAGNOSIS — I77810 Thoracic aortic ectasia: Secondary | ICD-10-CM

## 2011-12-13 DIAGNOSIS — E7800 Pure hypercholesterolemia, unspecified: Secondary | ICD-10-CM

## 2011-12-13 LAB — HEPATIC FUNCTION PANEL
ALT: 20 U/L (ref 0–53)
Total Bilirubin: 1 mg/dL (ref 0.3–1.2)
Total Protein: 6.4 g/dL (ref 6.0–8.3)

## 2011-12-13 LAB — LIPID PANEL
HDL: 54.4 mg/dL (ref >39.00)
LDL Cholesterol: 67 mg/dL (ref 0–99)
VLDL: 11.6 mg/dL (ref 0.0–40.0)

## 2011-12-13 MED ORDER — IOHEXOL 350 MG/ML SOLN
100.0000 mL | Freq: Once | INTRAVENOUS | Status: AC | PRN
  Administered 2011-12-13: 100 mL via INTRAVENOUS

## 2011-12-13 NOTE — Progress Notes (Signed)
Echocardiogram performed.  

## 2012-01-26 ENCOUNTER — Ambulatory Visit (INDEPENDENT_AMBULATORY_CARE_PROVIDER_SITE_OTHER): Payer: Medicare Other | Admitting: Cardiology

## 2012-01-26 ENCOUNTER — Encounter: Payer: Self-pay | Admitting: Cardiology

## 2012-01-26 VITALS — BP 108/68 | HR 72 | Ht 73.0 in | Wt 176.8 lb

## 2012-01-26 DIAGNOSIS — I429 Cardiomyopathy, unspecified: Secondary | ICD-10-CM | POA: Insufficient documentation

## 2012-01-26 DIAGNOSIS — I428 Other cardiomyopathies: Secondary | ICD-10-CM

## 2012-01-26 DIAGNOSIS — R9389 Abnormal findings on diagnostic imaging of other specified body structures: Secondary | ICD-10-CM

## 2012-01-26 DIAGNOSIS — E7800 Pure hypercholesterolemia, unspecified: Secondary | ICD-10-CM

## 2012-01-26 DIAGNOSIS — R002 Palpitations: Secondary | ICD-10-CM

## 2012-01-26 DIAGNOSIS — I712 Thoracic aortic aneurysm, without rupture, unspecified: Secondary | ICD-10-CM

## 2012-01-26 DIAGNOSIS — J479 Bronchiectasis, uncomplicated: Secondary | ICD-10-CM | POA: Insufficient documentation

## 2012-01-26 LAB — TSH: TSH: 1.18 u[IU]/mL (ref 0.35–5.50)

## 2012-01-26 MED ORDER — METOPROLOL SUCCINATE ER 25 MG PO TB24: ORAL_TABLET | ORAL | Status: DC

## 2012-01-26 NOTE — Assessment & Plan Note (Signed)
No evidence of aneurysm on recent CT.

## 2012-01-26 NOTE — Patient Instructions (Addendum)
Your physician recommends that you schedule a follow-up appointment in: 3 MONTHS WITH DR Jens Som  Your physician has requested that you have a lexiscan myoview. For further information please visit [email or web address] Please follow instruction sheet, as given.   Your physician recommends that you HAVE LAB WORK TODAY  Your physician has recommended that you have a pulmonary function test. Pulmonary Function Tests are a group of tests that measure how well air moves in and out of your lungs.    START METOPROLOL SUCC 25 MG TAKE ONE HALF TABLET ONCE DAILY WITH FOOD

## 2012-01-26 NOTE — Assessment & Plan Note (Addendum)
Patient's LV function is mildly reduced compared to previous. Etiology unclear. No chest pain. Plan Myoview to screen for coronary disease. Check TSH and ferritin. Patient's right heart is also dilated. I think this is most likely to his previous pulmonary disease including lung resection. Schedule pulmonary function tests. Add Toprol 12.5 mg by mouth daily.

## 2012-01-26 NOTE — Progress Notes (Signed)
HPI:[name] [name] is a pleasant gentleman who I have seen in the past for palpitations, mildly dilated aortic root and family history of abdominal aortic aneurysm. He had an abdominal ultrasound in July 2008 that showed no abdominal aortic aneurysm. Myoview on January 20, 2008 showed an ejection fraction of 57%. There was normal LV function and wall motion. Dr. Gala Romney read the study and felt that there was a possible minimal reversible defect at the base suggestive of ischemia. I have reviewed these images and felt that it is most likely normal. We have therefore been treating medically. Carotid dopplers were performed on January 01, 2008. He was found to have normal carotid arteries bilaterally. An echocardiogram was repeated in July of 2013. His ejection fraction was felt to be 40-45%; minimal pericardial effusion; mild RAE and moderate RVE; mild TR. There was mild aortic root dilatation. CTA in July of 2013 showed no aneurysm. There was note of 6 mm right lower lobe nodule and followup recommended in 6 months. There is also a small nodule in the right major fissure and followup recommended. I last saw him in July of 2013. Since then the patient has dyspnea with more extreme activities but not with routine activities. It is relieved with rest. It is not associated with chest pain. There is no orthopnea, PND or pedal edema. There is no syncope or palpitations. There is no exertional chest pain.   Current Outpatient Prescriptions  Medication Sig Dispense Refill  . aspirin 81 MG EC tablet Take 81 mg by mouth daily.        Marland Kitchen atorvastatin (LIPITOR) 40 MG tablet TAKE 1 TABLET BY MOUTH EVERY DAY  30 tablet  6  . Calcium Carbonate-Vitamin D (CALCIUM 600/VITAMIN D) 600-400 MG-UNIT per tablet Take 1 tablet by mouth 2 (two) times daily.        Marland Kitchen co-enzyme Q-10 30 MG capsule Take 30 mg by mouth 2 (two) times daily.        . fish oil-omega-3 fatty acids 1000 MG capsule Take 1 g by mouth 2 (two) times daily. Nordic  naturals omega 3 TID      . fluticasone (FLONASE) 50 MCG/ACT nasal spray Place 2 sprays into the nose daily.  16 g  11  . Polyethyl Glycol-Propyl Glycol (SYSTANE OP) Apply to eye as needed. And systane gel drops at night         Past Medical History  Diagnosis Date  . RBBB (right bundle branch block)   . Hyperlipidemia   . Mitral valve prolapse     irregular heart beat  . Palpitations   . Pure hypercholesterolemia   . Dilated aortic root     Hx of mild  . Benign tumor of lung   . BPH (benign prostatic hyperplasia)   . Cardiomyopathy     Past Surgical History  Procedure Date  . Excision on neck     Excision of 2 cm soft tissue mass of left neck  . Resection of lung     Hx of, right lung secondary to benign tumor  . Testicular hernia   . Shoulder arthroscopy     Lt    History   Social History  . Marital Status: Married    Spouse Name: N/A    Number of Children: N/A  . Years of Education: N/A   Occupational History  . [employer]    Social History Main Topics  . Smoking status: Former Games developer  . Smokeless tobacco: Never Used  Comment: quit 80's  . Alcohol Use: No  . Drug Use: No  . Sexually Active: Not on file   Other Topics Concern  . Not on file   Social History Narrative   Exercise 20-30 min walks.    ROS: no fevers or chills, productive cough, hemoptysis, dysphasia, odynophagia, melena, hematochezia, dysuria, hematuria, rash, seizure activity, orthopnea, PND, pedal edema, claudication. Remaining systems are negative.  Physical Exam: Well-developed well-nourished in no acute distress.  Skin is warm and dry.  HEENT is normal.  Neck is supple.  Chest is clear to auscultation with normal expansion.  Cardiovascular exam is regular rate and rhythm.  Abdominal exam nontender or distended. No masses palpated. Extremities show no edema. neuro grossly intact

## 2012-01-26 NOTE — Assessment & Plan Note (Signed)
Followup chest CT for nodules in January 2014.

## 2012-01-26 NOTE — Assessment & Plan Note (Signed)
Continue statin. 

## 2012-01-30 ENCOUNTER — Encounter: Payer: Self-pay | Admitting: Internal Medicine

## 2012-02-06 ENCOUNTER — Ambulatory Visit (HOSPITAL_COMMUNITY): Payer: Medicare Other | Attending: Cardiology | Admitting: Radiology

## 2012-02-06 VITALS — BP 99/66 | HR 49 | Ht 73.0 in | Wt 174.0 lb

## 2012-02-06 DIAGNOSIS — R0609 Other forms of dyspnea: Secondary | ICD-10-CM | POA: Insufficient documentation

## 2012-02-06 DIAGNOSIS — Z87891 Personal history of nicotine dependence: Secondary | ICD-10-CM | POA: Insufficient documentation

## 2012-02-06 DIAGNOSIS — R0989 Other specified symptoms and signs involving the circulatory and respiratory systems: Secondary | ICD-10-CM | POA: Insufficient documentation

## 2012-02-06 DIAGNOSIS — I428 Other cardiomyopathies: Secondary | ICD-10-CM | POA: Insufficient documentation

## 2012-02-06 DIAGNOSIS — R002 Palpitations: Secondary | ICD-10-CM

## 2012-02-06 DIAGNOSIS — R0602 Shortness of breath: Secondary | ICD-10-CM

## 2012-02-06 MED ORDER — TECHNETIUM TC 99M TETROFOSMIN IV KIT
10.0000 | PACK | Freq: Once | INTRAVENOUS | Status: AC | PRN
  Administered 2012-02-06: 10 via INTRAVENOUS

## 2012-02-06 MED ORDER — REGADENOSON 0.4 MG/5ML IV SOLN
0.4000 mg | Freq: Once | INTRAVENOUS | Status: AC
  Administered 2012-02-06: 0.4 mg via INTRAVENOUS

## 2012-02-06 MED ORDER — TECHNETIUM TC 99M TETROFOSMIN IV KIT
30.0000 | PACK | Freq: Once | INTRAVENOUS | Status: AC | PRN
  Administered 2012-02-06: 30 via INTRAVENOUS

## 2012-02-06 NOTE — Progress Notes (Signed)
[facility] SITE 3 NUCLEAR MED [address] [place] [address] [phone]  Cardiology Nuclear Med Study  [name] [name] is a 78 y.o. male     MRN : [id number]     DOB: 11-04-1930  Procedure Date: 02/06/2012  Nuclear Med Background Indication for Stress Test:  Evaluation for Ischemia History:  '09 OZH:[id number] inferior wall ischemia, EF=57%; 12/13/11 Echo:EF=40-45%; Cardiomyopathy Cardiac Risk Factors: History of Smoking, Lipids, Family History of Aneurysms and RBBB  Symptoms:  DOE   Nuclear Pre-Procedure Caffeine/Decaff Intake:  None NPO After: 10:00pm   Lungs:  Clear. O2 Sat: 98% on room air. IV 0.9% NS with Angio Cath:  20g  IV Site: R Wrist  IV Started by:  Cathlyn Parsons, RN  Chest Size (in):  44 Cup Size: n/a  Height: 6\' 1"  (1.854 m)  Weight:  174 lb (78.926 kg)  BMI:  Body mass index is 22.96 kg/(m^2). Tech Comments:  Toprol XL held x 24 hours    Nuclear Med Study 1 or 2 day study: 1 day  Stress Test Type:  Treadmill/Lexiscan  Reading MD: Olga Millers, MD  Order Authorizing Provider:  Ripley Fraise  Resting Radionuclide: Technetium 6m Tetrofosmin  Resting Radionuclide Dose: 11.0 mCi   Stress Radionuclide:  Technetium 39m Tetrofosmin  Stress Radionuclide Dose: 33.0 mCi           Stress Protocol Rest HR: 49 Stress HR: 114  Rest BP: 99/66 Stress BP: 134/68  Exercise Time (min): 2:00 METS: n/a   Predicted Max HR: 141 bpm % Max HR: 80.85 bpm Rate Pressure Product: 69629   Dose of Adenosine (mg):  n/a Dose of Lexiscan: 0.4 mg  Dose of Atropine (mg): n/a Dose of Dobutamine: n/a mcg/kg/min (at max HR)  Stress Test Technologist: Smiley Houseman, CMA-N  Nuclear Technologist:  Domenic Polite, CNMT     Rest Procedure:  Myocardial perfusion imaging was performed at rest 45 minutes following the intravenous administration of Technetium 41m Tetrofosmin.  Rest ECG: RBBB with occasional PAC/PVC's.  Stress Procedure:  The patient  received IV Lexiscan 0.4 mg over 15-seconds with concurrent low level exercise and then Technetium 52m Tetrofosmin was injected at 30-seconds while the patient continued walking one more minute. There were no significant changes with Lexiscan, occasional PVC's were noted. Quantitative spect images were obtained after a 45-minute delay.  Stress ECG: No significant ST segment change suggestive of ischemia.  QPS Raw Data Images:  Acquisition technically good; normal left ventricular size. Stress Images:  Normal homogeneous uptake in all areas of the myocardium. Rest Images:  Normal homogeneous uptake in all areas of the myocardium. Subtraction (SDS):  No evidence of ischemia. Transient Ischemic Dilatation (Normal <1.22):  1.12 Lung/Heart Ratio (Normal <0.45):  0.34  Quantitative Gated Spect Images QGS EDV:  90 ml QGS ESV:  42 ml  Impression Exercise Capacity:  Lexiscan with low level exercise. BP Response:  Normal blood pressure response. Clinical Symptoms:  No chest pain or dyspnea. ECG Impression:  No significant ST segment change suggestive of ischemia. Comparison with Prior Nuclear Study: No images to compare  Overall Impression:  Normal stress nuclear study.  LV Ejection Fraction: 53%.  LV Wall Motion:  NL LV Function; NL Wall Motion  Olga Millers

## 2012-02-14 ENCOUNTER — Ambulatory Visit (INDEPENDENT_AMBULATORY_CARE_PROVIDER_SITE_OTHER): Payer: Medicare Other | Admitting: Internal Medicine

## 2012-02-14 DIAGNOSIS — H02059 Trichiasis without entropian unspecified eye, unspecified eyelid: Secondary | ICD-10-CM | POA: Insufficient documentation

## 2012-02-14 DIAGNOSIS — R0602 Shortness of breath: Secondary | ICD-10-CM

## 2012-02-14 LAB — PULMONARY FUNCTION TEST

## 2012-02-14 NOTE — Progress Notes (Signed)
PFT done today. 

## 2012-02-21 ENCOUNTER — Encounter: Payer: Self-pay | Admitting: Cardiology

## 2012-04-15 ENCOUNTER — Ambulatory Visit (INDEPENDENT_AMBULATORY_CARE_PROVIDER_SITE_OTHER): Payer: Medicare Other | Admitting: Cardiology

## 2012-04-15 ENCOUNTER — Encounter: Payer: Self-pay | Admitting: Cardiology

## 2012-04-15 VITALS — BP 114/70 | HR 50 | Ht 73.0 in | Wt 177.0 lb

## 2012-04-15 DIAGNOSIS — I712 Thoracic aortic aneurysm, without rupture, unspecified: Secondary | ICD-10-CM

## 2012-04-15 DIAGNOSIS — I428 Other cardiomyopathies: Secondary | ICD-10-CM

## 2012-04-15 DIAGNOSIS — I429 Cardiomyopathy, unspecified: Secondary | ICD-10-CM

## 2012-04-15 DIAGNOSIS — E7800 Pure hypercholesterolemia, unspecified: Secondary | ICD-10-CM

## 2012-04-15 DIAGNOSIS — R9389 Abnormal findings on diagnostic imaging of other specified body structures: Secondary | ICD-10-CM

## 2012-04-15 NOTE — Assessment & Plan Note (Signed)
Continue statin. 

## 2012-04-15 NOTE — Assessment & Plan Note (Signed)
Nuclear study showed normal perfusion and normal LV function. TSH and ferritin normal. Continue low-dose Toprol.

## 2012-04-15 NOTE — Patient Instructions (Addendum)
Your physician wants you to follow-up in: 6 MONTHS WITH DR CRENSHAW You will receive a reminder letter in the mail two months in advance. If you don't receive a letter, please call our office to schedule the follow-up appointment.  

## 2012-04-15 NOTE — Assessment & Plan Note (Signed)
Most recent CT scan did not show an aneurysm.

## 2012-04-15 NOTE — Progress Notes (Signed)
HPI: [name] [name] is a pleasant gentleman who I have seen in the past for palpitations, mildly dilated aortic root and family history of abdominal aortic aneurysm. He had an abdominal ultrasound in July 2008 that showed no abdominal aortic aneurysm. Carotid dopplers were performed on January 01, 2008. He was found to have normal carotid arteries bilaterally. An echocardiogram was repeated in July of 2013. His ejection fraction was felt to be 40-45%; minimal pericardial effusion; mild RAE and moderate RVE; mild TR. There was mild aortic root dilatation. CTA in July of 2013 showed no aneurysm. There was note of 6 mm right lower lobe nodule and followup recommended in 6 months. There is also a small nodule in the right major fissure and followup recommended. Myoview was repeated in September of 2013. Ejection fraction was 53% and the perfusion was normal. TSH and ferritin were normal in August of 2013. PFTs in September of 2013 showed mild obstructive defect. Since last saw him the patient has dyspnea with more extreme activities but not with routine activities. It is relieved with rest. It is not associated with chest pain. There is no orthopnea, PND or pedal edema. There is no syncope or palpitations. There is no exertional chest pain.    Current Outpatient Prescriptions  Medication Sig Dispense Refill  . aspirin 81 MG EC tablet Take 81 mg by mouth daily.        Marland Kitchen atorvastatin (LIPITOR) 40 MG tablet TAKE 1 TABLET BY MOUTH EVERY DAY  30 tablet  6  . Calcium Carbonate-Vitamin D (CALCIUM 600/VITAMIN D) 600-400 MG-UNIT per tablet Take 1 tablet by mouth 2 (two) times daily.        Marland Kitchen co-enzyme Q-10 30 MG capsule Take 30 mg by mouth 2 (two) times daily.        . ERYTHROMYCIN OP Apply to eye. OPHTH OINP 3.5 MG AT NIGHT IN [facility]      . fish oil-omega-3 fatty acids 1000 MG capsule Take 1 g by mouth 2 (two) times daily. Nordic naturals omega 3 TID      . fluticasone (FLONASE) 50 MCG/ACT nasal spray Place 2 sprays  into the nose daily.  16 g  11  . metoprolol succinate (TOPROL XL) 25 MG 24 hr tablet TAKE 1/2 TABLET ONCE DAILY  30 tablet  11  . NON FORMULARY HYDROCTISONE BUT 0.1% CREAM 45GM PRN      . Polyethyl Glycol-Propyl Glycol (SYSTANE OP) Apply to eye as needed. And systane gel drops at night         Past Medical History  Diagnosis Date  . RBBB (right bundle branch block)   . Hyperlipidemia   . Mitral valve prolapse     irregular heart beat  . Palpitations   . Pure hypercholesterolemia   . Dilated aortic root     Hx of mild  . Benign tumor of lung   . BPH (benign prostatic hyperplasia)   . Cardiomyopathy     Past Surgical History  Procedure Date  . Excision on neck     Excision of 2 cm soft tissue mass of left neck  . Resection of lung     Hx of, right lung secondary to benign tumor  . Testicular hernia   . Shoulder arthroscopy     Lt    History   Social History  . Marital Status: Married    Spouse Name: N/A    Number of Children: N/A  . Years of Education: N/A  Occupational History  . [employer]    Social History Main Topics  . Smoking status: Former Games developer  . Smokeless tobacco: Never Used     Comment: quit 80's  . Alcohol Use: No  . Drug Use: No  . Sexually Active: Not on file   Other Topics Concern  . Not on file   Social History Narrative   Exercise 20-30 min walks.    ROS: no fevers or chills, productive cough, hemoptysis, dysphasia, odynophagia, melena, hematochezia, dysuria, hematuria, rash, seizure activity, orthopnea, PND, pedal edema, claudication. Remaining systems are negative.  Physical Exam: Well-developed well-nourished in no acute distress.  Skin is warm and dry.  HEENT is normal.  Neck is supple.  Chest is clear to auscultation with normal expansion.  Cardiovascular exam is regular rate and rhythm.  Abdominal exam nontender or distended. No masses palpated. Extremities show no edema. neuro grossly intact  ECG sinus bradycardia at a rate  of 50. First degree AV block. Right bundle branch block.

## 2012-04-15 NOTE — Assessment & Plan Note (Signed)
Plan repeat CT scan in January 2014.

## 2012-06-03 ENCOUNTER — Telehealth: Payer: Self-pay | Admitting: Cardiology

## 2012-06-03 NOTE — Telephone Encounter (Signed)
Pt calling re pulmonary test we set up for 06-06-12, but they dont show any appts

## 2012-06-03 NOTE — Telephone Encounter (Signed)
Spoke with pt, aware he does not need PFT's at this time.

## 2012-06-22 ENCOUNTER — Other Ambulatory Visit: Payer: Self-pay | Admitting: Cardiology

## 2012-07-08 ENCOUNTER — Telehealth: Payer: Self-pay | Admitting: *Deleted

## 2012-07-08 ENCOUNTER — Other Ambulatory Visit: Payer: Self-pay | Admitting: *Deleted

## 2012-07-08 DIAGNOSIS — R911 Solitary pulmonary nodule: Secondary | ICD-10-CM

## 2012-07-08 NOTE — Telephone Encounter (Signed)
Spoke with pt, it is time to re-look at his lung nodule. He is scheduled for non-contrast CT of the chest 07-11-12 @ 9:30am at [facility] on [address]

## 2012-07-11 ENCOUNTER — Other Ambulatory Visit: Payer: Medicare Other

## 2012-07-15 ENCOUNTER — Ambulatory Visit (INDEPENDENT_AMBULATORY_CARE_PROVIDER_SITE_OTHER)
Admission: RE | Admit: 2012-07-15 | Discharge: 2012-07-15 | Disposition: A | Discharge status: Home / Self Care | Payer: Medicare Other | Source: Ambulatory Visit | Attending: Cardiology | Admitting: Cardiology

## 2012-07-15 DIAGNOSIS — R911 Solitary pulmonary nodule: Secondary | ICD-10-CM

## 2012-07-16 ENCOUNTER — Encounter: Payer: Self-pay | Admitting: Gastroenterology

## 2012-07-20 ENCOUNTER — Other Ambulatory Visit: Payer: Self-pay

## 2012-07-29 ENCOUNTER — Encounter: Payer: Self-pay | Admitting: Gastroenterology

## 2012-08-21 ENCOUNTER — Ambulatory Visit (AMBULATORY_SURGERY_CENTER): Payer: Medicare Other | Admitting: *Deleted

## 2012-08-21 VITALS — Ht 73.0 in | Wt 181.6 lb

## 2012-08-21 DIAGNOSIS — Z1211 Encounter for screening for malignant neoplasm of colon: Secondary | ICD-10-CM

## 2012-08-21 MED ORDER — NA SULFATE-K SULFATE-MG SULF 17.5-3.13-1.6 GM/177ML PO SOLN: ORAL | Status: DC

## 2012-09-03 ENCOUNTER — Ambulatory Visit (AMBULATORY_SURGERY_CENTER): Payer: Medicare Other | Admitting: Gastroenterology

## 2012-09-03 ENCOUNTER — Encounter: Payer: Self-pay | Admitting: Gastroenterology

## 2012-09-03 VITALS — BP 115/65 | HR 49 | Temp 96.5°F | Resp 19 | Ht 73.0 in | Wt 181.0 lb

## 2012-09-03 DIAGNOSIS — Z800 Family history of malignant neoplasm of digestive organs: Secondary | ICD-10-CM

## 2012-09-03 DIAGNOSIS — Z1211 Encounter for screening for malignant neoplasm of colon: Secondary | ICD-10-CM

## 2012-09-03 MED ORDER — SODIUM CHLORIDE 0.9 % IV SOLN: 500.0000 mL | INTRAVENOUS | Status: DC

## 2012-09-03 NOTE — Op Note (Signed)
[facility] [address] [place], [address]   COLONOSCOPY PROCEDURE REPORT  PATIENT: [name] [name], [name] [name]  MR#: [id number] BIRTHDATE: 1931/03/28 , 81  yrs. old GENDER: Male ENDOSCOPIST: Louis Meckel, MD REFERRED [id number] Cleta Alberts, M.D. PROCEDURE DATE:  09/03/2012 PROCEDURE:   Colonoscopy, diagnostic ASA CLASS:   Class II INDICATIONS:Patient's immediate family history of colon cancer. MEDICATIONS: MAC sedation, administered by CRNA and propofol (Diprivan) 150mg  IV  DESCRIPTION OF PROCEDURE:   After the risks benefits and alternatives of the procedure were thoroughly explained, informed consent was obtained.  A digital rectal exam revealed no abnormalities of the rectum.   The LB [id number]  endoscope was introduced through the anus and advanced to the cecum, which was identified by both the appendix and ileocecal valve. No adverse events experienced.   The quality of the prep was Suprep good  The instrument was then slowly withdrawn as the colon was fully examined.      COLON FINDINGS: A normal appearing cecum, ileocecal valve, and appendiceal orifice were identified.  The ascending, hepatic flexure, transverse, splenic flexure, descending, sigmoid colon and rectum appeared unremarkable.  No polyps or cancers were seen. Retroflexed views revealed no abnormalities. The time to cecum=4 minutes 11 seconds.  Withdrawal time=7 minutes 10 seconds.  The scope was withdrawn and the procedure completed. COMPLICATIONS: There were no complications.  ENDOSCOPIC IMPRESSION: Normal colon  RECOMMENDATIONS: Given your age, you will not need another colonoscopy for colon cancer screening or polyp surveillance.  These types of tests usually stop around the age 39.   eSigned:  Louis Meckel, MD 09/03/2012 10:34 AM   cc:

## 2012-09-03 NOTE — Progress Notes (Signed)
Patient did not experience any of the following events: a burn prior to discharge; a fall within the facility; wrong site/side/patient/procedure/implant event; or a hospital transfer or hospital admission upon discharge from the facility. (G8907) Patient did not have preoperative order for IV antibiotic SSI prophylaxis. (G8918)  

## 2012-09-03 NOTE — Patient Instructions (Addendum)

## 2012-09-04 ENCOUNTER — Telehealth: Payer: Self-pay | Admitting: *Deleted

## 2012-09-04 NOTE — Telephone Encounter (Signed)
  Follow up Call-  Call back number 09/03/2012  Post procedure Call Back phone  # [phone]  Permission to leave phone message Yes     Patient questions:  Do you have a fever, pain , or abdominal swelling? no Pain Score  0 *  Have you tolerated food without any problems? yes  Have you been able to return to your normal activities? yes  Do you have any questions about your discharge instructions: Diet   no Medications  no Follow up visit  no  Do you have questions or concerns about your Care? no  Actions: * If pain score is 4 or above: No action needed, pain <4.

## 2012-10-08 ENCOUNTER — Ambulatory Visit (INDEPENDENT_AMBULATORY_CARE_PROVIDER_SITE_OTHER): Payer: Medicare Other | Admitting: Emergency Medicine

## 2012-10-08 ENCOUNTER — Encounter: Payer: Self-pay | Admitting: Emergency Medicine

## 2012-10-08 VITALS — BP 116/72 | HR 63 | Temp 97.9°F | Resp 16 | Ht 72.0 in | Wt 174.0 lb

## 2012-10-08 DIAGNOSIS — L03312 Cellulitis of back [any part except buttock]: Secondary | ICD-10-CM

## 2012-10-08 DIAGNOSIS — L02219 Cutaneous abscess of trunk, unspecified: Secondary | ICD-10-CM

## 2012-10-08 DIAGNOSIS — L03319 Cellulitis of trunk, unspecified: Secondary | ICD-10-CM

## 2012-10-08 DIAGNOSIS — Z0000 Encounter for general adult medical examination without abnormal findings: Secondary | ICD-10-CM

## 2012-10-08 DIAGNOSIS — Z9109 Other allergy status, other than to drugs and biological substances: Secondary | ICD-10-CM

## 2012-10-08 LAB — CBC
HCT: 45.4 % (ref 39.0–52.0)
MCHC: 34.6 g/dL (ref 30.0–36.0)
RDW: 13.2 % (ref 11.5–15.5)
WBC: 5.1 10*3/uL (ref 4.0–10.5)

## 2012-10-08 LAB — LIPID PANEL
HDL: 52 mg/dL (ref >39)
LDL Cholesterol: 74 mg/dL (ref 0–99)
VLDL: 14 mg/dL (ref 0–40)

## 2012-10-08 LAB — COMPREHENSIVE METABOLIC PANEL
ALT: 16 U/L (ref 0–53)
AST: 17 U/L (ref 0–37)
Albumin: 4.7 g/dL (ref 3.5–5.2)
Alkaline Phosphatase: 66 U/L (ref 39–117)
BUN: 11 mg/dL (ref 6–23)
Calcium: 9.7 mg/dL (ref 8.4–10.5)
Chloride: 100 mEq/L (ref 96–112)
Potassium: 4.5 mEq/L (ref 3.5–5.3)
Sodium: 140 mEq/L (ref 135–145)
Total Protein: 7.1 g/dL (ref 6.0–8.3)

## 2012-10-08 LAB — IFOBT (OCCULT BLOOD): IFOBT: NEGATIVE

## 2012-10-08 MED ORDER — DOXYCYCLINE HYCLATE 100 MG PO CAPS: 100.0000 mg | ORAL_CAPSULE | Freq: Two times a day (BID) | ORAL | Status: DC

## 2012-10-08 MED ORDER — FLUTICASONE PROPIONATE 50 MCG/ACT NA SUSP: 2.0000 | Freq: Every day | NASAL | Status: DC

## 2012-10-08 NOTE — Progress Notes (Signed)
  Subjective:    Patient ID: [name] [name], male    DOB: January 09, 1931, 79 y.o.   MRN: [id number]  HPI    Review of Systems  Constitutional: Negative.   HENT: Positive for sinus pressure.   Eyes: Positive for itching.  Respiratory: Negative.   Cardiovascular: Negative.   Gastrointestinal: Negative.   Endocrine: Negative.   Genitourinary: Negative.   Musculoskeletal: Negative.   Skin: Positive for wound.  Allergic/Immunologic: Negative.   Neurological: Negative.   Hematological: Bruises/bleeds easily.  Psychiatric/Behavioral: Negative.        Objective:   Physical Exam HEENT exam is unremarkable. His neck is supple. His chest is clear to auscultation and percussion. His heart is a regular rate without murmurs rubs or gallops. The abdomen is soft liver and spleen are not enlarged there no areas of tenderness. GU is that of a normal male testicles descended no hernia felt rectal exam confirms a slightly enlarged irregular prostate with no dominant masses. Hemmosure was  was done. There is an easily reducible right inguinal hernia. There is a right inguinal herniorrhaphy scar from previous surgery.       Assessment & Plan:  Flonase was refilled we'll treat with doxycycline for his infection recheck on Thursday . Wound culture was done .

## 2012-10-08 NOTE — Progress Notes (Signed)
Patient ID: Yacine Garriga MRN: [id number], DOB: 1930/08/07, 79 y.o. Date of Encounter: 10/08/2012, 12:51 PM    PROCEDURE NOTE: Verbal consent obtained. Betadine prep per usual protocol. Local anesthesia obtained with 1% lidocaine with epinephrine.    1 cm incision made with 11 blade along lesion.  Culture taken. Moderate amount of purulence expressed. Copious amounts of sebaceous material expressed.  Lesion explored revealing no loculations. Packed with 1/4 plain packing.  Dressed. Wound care instructions including precautions with patient. Patient tolerated the procedure well. Recheck in 48 hours.      Grier Mitts, PA-C 10/08/2012 12:51 PM

## 2012-10-09 ENCOUNTER — Encounter: Payer: Self-pay | Admitting: *Deleted

## 2012-10-10 ENCOUNTER — Ambulatory Visit (INDEPENDENT_AMBULATORY_CARE_PROVIDER_SITE_OTHER): Payer: Medicare Other | Admitting: Physician Assistant

## 2012-10-10 VITALS — BP 110/66 | HR 66 | Temp 97.6°F | Resp 16 | Ht 72.0 in | Wt 175.0 lb

## 2012-10-10 DIAGNOSIS — L723 Sebaceous cyst: Secondary | ICD-10-CM

## 2012-10-10 LAB — WOUND CULTURE: Gram Stain: NONE SEEN

## 2012-10-10 NOTE — Progress Notes (Signed)
Patient ID: [name] [name] MRN: [id number], DOB: Dec 31, 1930 79 y.o. Date of Encounter: 10/10/2012, 9:18 AM  Primary Physician: Lucilla Edin, MD  Chief Complaint: Wound care   See previous note  HPI: 79 y.o. male presents for wound care s/p I&D on 10/08/12 Doing well No issues or complaints Afebrile/ no chills No nausea or vomiting Tolerating Doxycycline Pain much improved Daily dressing change Previous note reviewed  Past Medical History  Diagnosis Date  . RBBB (right bundle branch block)   . Hyperlipidemia   . Mitral valve prolapse     irregular heart beat  . Palpitations   . Pure hypercholesterolemia   . Dilated aortic root     Hx of mild  . Benign tumor of lung   . BPH (benign prostatic hyperplasia)   . Cardiomyopathy      Home Meds: Prior to Admission medications   Medication Sig Start Date End Date Taking? Authorizing Provider  aspirin 81 MG EC tablet Take 81 mg by mouth daily.     Yes Historical Provider, MD  atorvastatin (LIPITOR) 40 MG tablet TAKE 1 TABLET BY MOUTH EVERY DAY 06/22/12  Yes Lewayne Bunting, MD  Calcium Carbonate-Vitamin D (CALCIUM 600/VITAMIN D) 600-400 MG-UNIT per tablet Take 1 tablet by mouth 2 (two) times daily.     Yes Historical Provider, MD  co-enzyme Q-10 30 MG capsule Take 30 mg by mouth 2 (two) times daily.     Yes Historical Provider, MD  doxycycline (VIBRAMYCIN) 100 MG capsule Take 1 capsule (100 mg total) by mouth 2 (two) times daily. 10/08/12  Yes Collene Gobble, MD  ERYTHROMYCIN OP Apply to eye. OPHTH OINP 3.5 MG AT NIGHT IN [facility] EYE   Yes Historical Provider, MD  fish oil-omega-3 fatty acids 1000 MG capsule Take 1 g by mouth 2 (two) times daily. Nordic naturals omega 3 TID   Yes Historical Provider, MD  fluticasone (FLONASE) 50 MCG/ACT nasal spray Place 2 sprays into the nose daily. 10/08/12  Yes Collene Gobble, MD  metoprolol succinate (TOPROL XL) 25 MG 24 hr tablet TAKE 1/2 TABLET ONCE DAILY 01/26/12  Yes Lewayne Bunting, MD  NON  FORMULARY HYDROCTISONE BUT 0.1% CREAM 45GM PRN   Yes Historical Provider, MD  Polyethyl Glycol-Propyl Glycol (SYSTANE OP) Apply to eye as needed. And systane gel drops at night   Yes Historical Provider, MD    Allergies: No Known Allergies  ROS: Constitutional: Afebrile, no chills Cardiovascular: negative for chest pain or palpitations Dermatological: Positive for wound, erythema, and improved pain. Negative for warmth  GI: No nausea or vomiting   EXAM: Physical Exam: Blood pressure 110/66, pulse 66, temperature 97.6 F (36.4 C), temperature source Oral, resp. rate 16, height 6' (1.829 m), weight 175 lb (79.379 kg), SpO2 98.00%., Body mass index is 23.73 kg/(m^2). General: Well developed, well nourished, in no acute distress. Nontoxic appearing. Head: Normocephalic, atraumatic, sclera non-icteric.  Neck: Supple. Lungs: Breathing is unlabored. Heart: Normal rate. Skin:  Warm and moist. Dressing and packing in place. No induration or TTP.  Minimal erythema. Neuro: Alert and oriented X 3. Moves all extremities spontaneously. Normal gait.  Psych:  Responds to questions appropriately with a normal affect.   PROCEDURE: Dressing and packing removed. No purulence expressed. Moderate amount of sebaceous material expressed. Wound bed healthy. Irrigated with 1% plain lidocaine 5 cc. Repacked with 2% plain lidocaine.  Dressing applied.  LAB: Culture: no growth 2 days  [name]: 79 y.o. male with cellulitis/abscess as  above s/p I&D on 10/08/12 -Wound care per above -Continue Doxycycline -Pain well controlled -Daily dressing changes -Recheck 48 hours  Signed, Eula Listen, PA-C 10/10/2012 9:18 AM

## 2012-10-12 ENCOUNTER — Ambulatory Visit (INDEPENDENT_AMBULATORY_CARE_PROVIDER_SITE_OTHER): Payer: Medicare Other | Admitting: Physician Assistant

## 2012-10-12 VITALS — BP 115/74 | HR 80 | Temp 98.4°F | Resp 18

## 2012-10-12 DIAGNOSIS — L723 Sebaceous cyst: Secondary | ICD-10-CM

## 2012-10-12 DIAGNOSIS — L089 Local infection of the skin and subcutaneous tissue, unspecified: Secondary | ICD-10-CM

## 2012-10-12 NOTE — Progress Notes (Signed)
   [address] [place] [address]   Phone [phone]  Subjective:    Patient ID: [name] [name], male    DOB: [birth year], 79 y.o.   MRN: [id number]  HPI  Pt presents to clinic for wound recheck.  He feels like it is doing good.  The pain is less as well as the size.  He is tolerating the abc ok.  Review of Systems  Constitutional: Negative for fever and chills.  Gastrointestinal: Negative for nausea.  Skin: Positive for wound.       Objective:   Physical Exam  Vitals reviewed. Constitutional: He is oriented to person, place, and time. He appears well-developed and well-nourished.  HENT:  Head: Normocephalic and atraumatic.  Right Ear: External ear normal.  Left Ear: External ear normal.  Pulmonary/Chest: Effort normal.  Neurological: He is alert and oriented to person, place, and time.  Skin: Skin is warm and dry.  Wound on lower back. No surrounding erythema.  Some ecchymosis on upper R side of wound.  Psychiatric: He has a normal mood and affect. His behavior is normal. Judgment and thought content normal.   Procedure:  Consent obtained.  Packing removed.  Sebaceous material expressed.  The wound cavity has good granulation tissue - there is a thick fibrous cord running through the middle of the cavity that I could not remove.  Irrigated with 1% lido and then packed with plain packing (very small amount).  Wound culture showed no growth.     Assessment & Plan:  Infected sebaceous cyst  Benny Lennert PA-C 10/12/2012 9:31 AM

## 2012-10-14 ENCOUNTER — Ambulatory Visit (INDEPENDENT_AMBULATORY_CARE_PROVIDER_SITE_OTHER): Payer: Medicare Other | Admitting: Physician Assistant

## 2012-10-14 ENCOUNTER — Ambulatory Visit (INDEPENDENT_AMBULATORY_CARE_PROVIDER_SITE_OTHER): Payer: Medicare Other | Admitting: Cardiology

## 2012-10-14 ENCOUNTER — Encounter: Payer: Self-pay | Admitting: Cardiology

## 2012-10-14 VITALS — BP 103/66 | HR 67 | Temp 97.7°F | Resp 16 | Ht 72.0 in | Wt 177.0 lb

## 2012-10-14 VITALS — BP 110/60 | HR 47 | Wt 177.8 lb

## 2012-10-14 DIAGNOSIS — R9389 Abnormal findings on diagnostic imaging of other specified body structures: Secondary | ICD-10-CM

## 2012-10-14 DIAGNOSIS — L723 Sebaceous cyst: Secondary | ICD-10-CM

## 2012-10-14 DIAGNOSIS — E785 Hyperlipidemia, unspecified: Secondary | ICD-10-CM

## 2012-10-14 DIAGNOSIS — I712 Thoracic aortic aneurysm, without rupture, unspecified: Secondary | ICD-10-CM

## 2012-10-14 DIAGNOSIS — L089 Local infection of the skin and subcutaneous tissue, unspecified: Secondary | ICD-10-CM

## 2012-10-14 DIAGNOSIS — I429 Cardiomyopathy, unspecified: Secondary | ICD-10-CM

## 2012-10-14 DIAGNOSIS — I428 Other cardiomyopathies: Secondary | ICD-10-CM

## 2012-10-14 NOTE — Assessment & Plan Note (Signed)
Continue statin. 

## 2012-10-14 NOTE — Assessment & Plan Note (Signed)
LV function mildly decreased on previous echo. Continue low-dose beta blocker. No symptoms. Repeat echocardiogram when he returns in one year.

## 2012-10-14 NOTE — Assessment & Plan Note (Signed)
followup chest CT showed improvement in nodule. No followup recommended.

## 2012-10-14 NOTE — Patient Instructions (Addendum)
Your physician wants you to follow-up in: ONE YEAR WITH DR CRENSHAW You will receive a reminder letter in the mail two months in advance. If you don't receive a letter, please call our office to schedule the follow-up appointment.  

## 2012-10-14 NOTE — Progress Notes (Signed)
   [address] [place] [address]   Phone [phone]  Subjective:    Patient ID: [name] [name], male    DOB: 20-Aug-1930, 79 y.o.   MRN: [id number]  HPI Pt presents to clinic for wound recheck.  He is still tolerating abx ok.  He has not changed the drsg.   Review of Systems     Objective:   Physical Exam  Vitals reviewed. Constitutional: He is oriented to person, place, and time. He appears well-developed and well-nourished.  HENT:  Head: Normocephalic and atraumatic.  Right Ear: External ear normal.  Left Ear: External ear normal.  Pulmonary/Chest: Effort normal.  Neurological: He is alert and oriented to person, place, and time.  Skin: Skin is warm and dry.  Drsg and packing removed.  More purulent sebaceous material expressed.  No erythema around wound still ecchymosis on L lateral side of wound.  Irrigated with 1% lido and repacked with 1/4in plain packing - the wound tracks to the L side about 2 cm deep.  Psychiatric: He has a normal mood and affect. His behavior is normal. Judgment and thought content normal.       Assessment & Plan:  Infected sebaceous cyst Pt to continue and finish his abx.  He will recheck in 3 days.  He will change the drsg daily. Benny Lennert PA-C 10/14/2012 7:46 PM

## 2012-10-14 NOTE — Assessment & Plan Note (Signed)
Not evident on most recent CT.

## 2012-10-14 NOTE — Progress Notes (Signed)
HPI: Mr. Costilla is a pleasant gentleman who I have seen in the past for palpitations, cardiomyopathy and family history of abdominal aortic aneurysm. He had an abdominal ultrasound in July 2008 that showed no abdominal aortic aneurysm. Carotid dopplers were performed on January 01, 2008. He was found to have normal carotid arteries bilaterally. An echocardiogram was repeated in July of 2013. His ejection fraction was felt to be 40-45%; minimal pericardial effusion; mild RAE and moderate RVE; mild TR. There was mild aortic root dilatation. Myoview was repeated in September of 2013. Ejection fraction was 53% and the perfusion was normal. TSH and ferritin were normal in August of 2013. PFTs in September of 2013 showed mild obstructive defect. CTA in July of 2013 showed no aneurysm. There was note of 6 mm right lower lobe nodule and followup recommended in 6 months. There is also a small nodule in the right major fissure and followup recommended. FU CT in Jan 2014 showed less prominent nodule and no FU recommended. Since I last saw him in Nov 2013, the patient has dyspnea with more extreme activities but not with routine activities. It is relieved with rest. It is not associated with chest pain. There is no orthopnea, PND or pedal edema. There is no syncope or palpitations. There is no exertional chest pain.  Current Outpatient Prescriptions  Medication Sig Dispense Refill  . aspirin 81 MG EC tablet Take 81 mg by mouth daily.        Marland Kitchen atorvastatin (LIPITOR) 40 MG tablet TAKE 1 TABLET BY MOUTH EVERY DAY  30 tablet  5  . Calcium Carbonate-Vitamin D (CALCIUM 600/VITAMIN D) 600-400 MG-UNIT per tablet Take 1 tablet by mouth 2 (two) times daily.        Marland Kitchen co-enzyme Q-10 30 MG capsule Take 30 mg by mouth 2 (two) times daily.        Marland Kitchen doxycycline (VIBRAMYCIN) 100 MG capsule Take 1 capsule (100 mg total) by mouth 2 (two) times daily.  20 capsule  0  . ERYTHROMYCIN OP Apply to eye. OPHTH OINP 3.5 MG AT NIGHT IN [facility] EYE       . fish oil-omega-3 fatty acids 1000 MG capsule Take 1 g by mouth 2 (two) times daily. Nordic naturals omega 3 TID      . fluticasone (FLONASE) 50 MCG/ACT nasal spray Place 2 sprays into the nose daily.  16 g  11  . metoprolol succinate (TOPROL XL) 25 MG 24 hr tablet TAKE 1/2 TABLET ONCE DAILY  30 tablet  11  . NON FORMULARY HYDROCTISONE BUT 0.1% CREAM 45GM PRN      . Polyethyl Glycol-Propyl Glycol (SYSTANE OP) Apply to eye as needed. And systane gel drops at night       No current facility-administered medications for this visit.     Past Medical History  Diagnosis Date  . RBBB (right bundle branch block)   . Hyperlipidemia   . Mitral valve prolapse     irregular heart beat  . Palpitations   . Pure hypercholesterolemia   . Dilated aortic root     Hx of mild  . Benign tumor of lung   . BPH (benign prostatic hyperplasia)   . Cardiomyopathy     Past Surgical History  Procedure Laterality Date  . Excision on neck      Excision of 2 cm soft tissue mass of left neck  . Resection of lung      Hx of, right lung secondary to benign  tumor  . Testicular hernia    . Shoulder arthroscopy      Lt  . Cosmetic surgery    . Vasectomy      History   Social History  . Marital Status: Married    Spouse Name: N/A    Number of Children: N/A  . Years of Education: N/A   Occupational History  . [employer]    Social History Main Topics  . Smoking status: Former Games developer  . Smokeless tobacco: Never Used     Comment: quit 80's  . Alcohol Use: No  . Drug Use: No  . Sexually Active: Not on file   Other Topics Concern  . Not on file   Social History Narrative   Exercise 20-30 min walks. Patient is married. Education: [employer].     ROS: no fevers or chills, productive cough, hemoptysis, dysphasia, odynophagia, melena, hematochezia, dysuria, hematuria, rash, seizure activity, orthopnea, PND, pedal edema, claudication. Remaining systems are negative.  Physical Exam: Well-developed  well-nourished in no acute distress.  Skin is warm and dry.  HEENT is normal.  Neck is supple.  Chest is clear to auscultation with normal expansion.  Cardiovascular exam is regular rate and rhythm.  Abdominal exam nontender or distended. No masses palpated. Extremities show no edema. neuro grossly intact  ECG marked sinus bradycardia at a rate of 47. Right bundle branch block. Cannot rule out prior septal infarct. First degree AV block.

## 2012-10-17 ENCOUNTER — Ambulatory Visit (INDEPENDENT_AMBULATORY_CARE_PROVIDER_SITE_OTHER): Payer: Medicare Other | Admitting: Physician Assistant

## 2012-10-17 VITALS — BP 108/56 | HR 70 | Temp 97.5°F | Resp 16 | Ht 72.0 in | Wt 177.0 lb

## 2012-10-17 DIAGNOSIS — L723 Sebaceous cyst: Secondary | ICD-10-CM

## 2012-10-17 DIAGNOSIS — L089 Local infection of the skin and subcutaneous tissue, unspecified: Secondary | ICD-10-CM

## 2012-10-17 NOTE — Progress Notes (Signed)
   [address] [place] [address]   Phone [phone]  Subjective:    Patient ID: [name] [name], male    DOB: 18-Jul-1930, 79 y.o.   MRN: [id number]  HPI  Pt presents to clinic for wound recheck.  The pain has improved since his last visit.  He has not changed the drsg since his last ov.    Review of Systems  Constitutional: Negative for fever and chills.  Gastrointestinal: Negative for nausea.  Skin: Positive for wound.       Objective:   Physical Exam  Vitals reviewed. Constitutional: He is oriented to person, place, and time. He appears well-developed and well-nourished.  HENT:  Head: Normocephalic and atraumatic.  Right Ear: External ear normal.  Left Ear: External ear normal.  Neurological: He is alert and oriented to person, place, and time.  Skin: Skin is warm and dry.  Drsg and packing removed. Minimal drainage from wound, no sebaecous material expressed.  Repacked with 1/4 in plain packing - wound tracks upward and laterally to the L.  Ecchymosis is improving.  Psychiatric: He has a normal mood and affect. His behavior is normal. Judgment and thought content normal.          Assessment & Plan:  Infected sebaceous cyst Finish abx tonight.  Benny Lennert PA-C 10/17/2012 2:48 PM

## 2012-10-21 ENCOUNTER — Ambulatory Visit (INDEPENDENT_AMBULATORY_CARE_PROVIDER_SITE_OTHER): Payer: Medicare Other | Admitting: Physician Assistant

## 2012-10-21 VITALS — BP 102/52 | HR 64 | Temp 98.5°F | Resp 16 | Ht 72.0 in | Wt 177.0 lb

## 2012-10-21 DIAGNOSIS — L089 Local infection of the skin and subcutaneous tissue, unspecified: Secondary | ICD-10-CM

## 2012-10-21 DIAGNOSIS — L723 Sebaceous cyst: Secondary | ICD-10-CM

## 2012-10-21 NOTE — Progress Notes (Signed)
   [address] [place] [address]   Phone [phone]  Subjective:    Patient ID: [name] [name], male    DOB: 1931-02-03, 79 y.o.   MRN: [id number]  HPI Pt presents to clinic for recheck.  He has been doing well.  He is done with his abx.  Pain is gone.     Review of Systems  Gastrointestinal: Negative for nausea.  Skin: Positive for wound.       Objective:   Physical Exam  Vitals reviewed. Constitutional: He appears well-developed and well-nourished.  Pulmonary/Chest: Effort normal.  Skin: Skin is warm and dry.  Dsg and packing removed.  Small amount of purulent fluid expressed.  No surrounding erythema and no TTP.  Repacked with 1/4 in plain packing about 1/2 cm long placed into wound.  Psychiatric: He has a normal mood and affect. His behavior is normal. Judgment and thought content normal.        Assessment & Plan:  Infected sebaceous cyst - good healing - recheck in 4 days.  Change drsg daily. Benny Lennert PA-C 10/21/2012 2:48 PM

## 2012-10-25 ENCOUNTER — Ambulatory Visit (INDEPENDENT_AMBULATORY_CARE_PROVIDER_SITE_OTHER): Payer: Medicare Other | Admitting: Physician Assistant

## 2012-10-25 VITALS — BP 106/64 | HR 61 | Temp 97.6°F | Resp 16 | Ht 72.0 in | Wt 177.6 lb

## 2012-10-25 DIAGNOSIS — L723 Sebaceous cyst: Secondary | ICD-10-CM

## 2012-10-25 DIAGNOSIS — L089 Local infection of the skin and subcutaneous tissue, unspecified: Secondary | ICD-10-CM

## 2012-10-25 NOTE — Progress Notes (Signed)
   [address], [place] [address]   Phone [phone]  Subjective:    Patient ID: [name] [name], male    DOB: 08-Jan-1931, 79 y.o.   MRN: [id number]  HPI  Pt presents to clinic for wound recheck.  He is doing well.    Review of Systems  Skin: Positive for wound.       Objective:   Physical Exam  Vitals reviewed. Constitutional: He appears well-developed and well-nourished.  Pulmonary/Chest: Effort normal.  Skin: Skin is warm and dry.  Drsg and packing removed.  Some purulent sebaceous material expressed.  Wound tracks <1cm to the r lateral aspect and up.  Repacked with 1/4 plain packing.  Psychiatric: He has a normal mood and affect. His behavior is normal. Judgment and thought content normal.          Assessment & Plan:  Infected sebaceous cyst - improved - due to expression of sebaceous material will recheck patient in 2 days.  Once the wound stops draining we will stop rechecks.

## 2012-10-29 ENCOUNTER — Ambulatory Visit (INDEPENDENT_AMBULATORY_CARE_PROVIDER_SITE_OTHER): Payer: Medicare Other | Admitting: Physician Assistant

## 2012-10-29 VITALS — BP 116/62 | HR 83 | Temp 97.7°F | Resp 16

## 2012-10-29 DIAGNOSIS — L723 Sebaceous cyst: Secondary | ICD-10-CM

## 2012-10-29 DIAGNOSIS — L089 Local infection of the skin and subcutaneous tissue, unspecified: Secondary | ICD-10-CM

## 2012-10-29 NOTE — Progress Notes (Signed)
   [address], [place] [address]   Phone [phone]  Subjective:    Patient ID: [name] [name], male    DOB: March 08, 1931, 79 y.o.   MRN: [id number]  HPI  Pt presents to clinic for wound recheck.  He is doing good, not having any problems with the wound.  Review of Systems  Skin: Positive for wound.       Objective:   Physical Exam  Vitals reviewed. Constitutional: He is oriented to person, place, and time. He appears well-developed and well-nourished.  HENT:  Head: Normocephalic and atraumatic.  Right Ear: External ear normal.  Left Ear: External ear normal.  Pulmonary/Chest: Effort normal.  Neurological: He is alert and oriented to person, place, and time.  Skin: Skin is warm and dry.  Drsg and packing removed.  Small amount of purulence and sebaceous material expressed.  Wound opening is only slightly smaller than the wound track.  No TTP, no surrounding erythema or induration.  Psychiatric: He has a normal mood and affect. His behavior is normal. Judgment and thought content normal.          Assessment & Plan:  Infected sebaceous cyst Even with slight amount of drainage I did not pack the wound.  I think that the packing is plugging up the wound and because the patient is not routinely changing the drsg I think that it is extending the time for healing.  I think that due to the opening only being slightly smaller than the track I think that it will heal from the inside out at this point.  If it starts to have pain or get red pt should RTC. Benny Lennert PA-C 10/29/2012 1:19 PM

## 2012-11-04 ENCOUNTER — Encounter: Payer: Self-pay | Admitting: Emergency Medicine

## 2012-11-05 ENCOUNTER — Other Ambulatory Visit: Payer: Self-pay | Admitting: *Deleted

## 2012-11-05 DIAGNOSIS — Z125 Encounter for screening for malignant neoplasm of prostate: Secondary | ICD-10-CM

## 2012-11-05 NOTE — Telephone Encounter (Signed)
Do you want to recheck his PSA?

## 2012-11-05 NOTE — Progress Notes (Signed)
Patient to come in for labs only.

## 2012-11-07 ENCOUNTER — Other Ambulatory Visit (INDEPENDENT_AMBULATORY_CARE_PROVIDER_SITE_OTHER): Payer: Medicare Other | Admitting: *Deleted

## 2012-11-07 DIAGNOSIS — Z125 Encounter for screening for malignant neoplasm of prostate: Secondary | ICD-10-CM

## 2012-11-07 NOTE — Progress Notes (Signed)
Patient here for labs only. 

## 2013-01-31 ENCOUNTER — Ambulatory Visit: Payer: Medicare Other

## 2013-01-31 ENCOUNTER — Ambulatory Visit (INDEPENDENT_AMBULATORY_CARE_PROVIDER_SITE_OTHER): Payer: Medicare Other | Admitting: Emergency Medicine

## 2013-01-31 VITALS — BP 120/70 | HR 57 | Temp 98.1°F | Resp 20 | Ht 73.0 in | Wt 176.0 lb

## 2013-01-31 DIAGNOSIS — M25562 Pain in left knee: Secondary | ICD-10-CM

## 2013-01-31 DIAGNOSIS — M25569 Pain in unspecified knee: Secondary | ICD-10-CM

## 2013-01-31 MED ORDER — DICLOFENAC SODIUM 1 % TD GEL: 4.0000 g | Freq: Two times a day (BID) | TRANSDERMAL | Status: DC | PRN

## 2013-01-31 NOTE — Progress Notes (Signed)
  Subjective:    Patient ID: [name] [name], male    DOB: [birth year], 79 y.o.   MRN: [id number]  HPI patient enters with a one-month history of pain in his left knee. He denies injury to the knee. He's had no swelling of his knee but    Review of Systems     Objective:   Physical Exam patient lacks full extension of the left knee. There is no instability noted. There is a negative anterior drawer sign.  [facility] reading (PRIMARY) by  Dr Cleta Alberts there is a narrow medial joint space otherwise x-rays are unremarkable        Assessment & Plan:  Patient has narrow medial joint space and mild arthritic changes we'll treat with diclofenac to patient to call if he continues to have problems and make a referral to an orthopedist

## 2013-02-11 ENCOUNTER — Other Ambulatory Visit: Payer: Self-pay | Admitting: Cardiology

## 2013-03-19 ENCOUNTER — Encounter: Payer: Self-pay | Admitting: Cardiology

## 2013-03-19 ENCOUNTER — Other Ambulatory Visit: Payer: Self-pay | Admitting: Cardiology

## 2013-03-20 MED ORDER — METOPROLOL SUCCINATE ER 25 MG PO TB24: ORAL_TABLET | ORAL | Status: DC

## 2013-10-09 ENCOUNTER — Ambulatory Visit (INDEPENDENT_AMBULATORY_CARE_PROVIDER_SITE_OTHER): Payer: Medicare HMO | Admitting: Family Medicine

## 2013-10-09 VITALS — BP 110/64 | HR 60 | Temp 97.5°F | Resp 14 | Ht 71.0 in | Wt 175.4 lb

## 2013-10-09 DIAGNOSIS — L723 Sebaceous cyst: Secondary | ICD-10-CM

## 2013-10-09 DIAGNOSIS — H6120 Impacted cerumen, unspecified ear: Secondary | ICD-10-CM

## 2013-10-09 DIAGNOSIS — H6123 Impacted cerumen, bilateral: Secondary | ICD-10-CM

## 2013-10-09 DIAGNOSIS — L089 Local infection of the skin and subcutaneous tissue, unspecified: Secondary | ICD-10-CM

## 2013-10-09 DIAGNOSIS — H9313 Tinnitus, bilateral: Secondary | ICD-10-CM

## 2013-10-09 DIAGNOSIS — H9319 Tinnitus, unspecified ear: Secondary | ICD-10-CM

## 2013-10-09 MED ORDER — DOXYCYCLINE HYCLATE 100 MG PO CAPS: 100.0000 mg | ORAL_CAPSULE | Freq: Two times a day (BID) | ORAL | Status: DC

## 2013-10-09 NOTE — Progress Notes (Addendum)
Subjective:    Patient ID: [name] [name], male    DOB: Sep 11, 1930, 80 y.o.   MRN: [id number]  This chart was scribed for Delman Cheadle, MD by Erling Conte, Medical Scribe. This patient was seen in room Room 8 and the patient's care was started at 11:16 AM. Chief Complaint  Patient presents with  . abcess on chest     x1 week  . Tinnitus    both ears x 53mth    HPI HPI Comments: [name] [name] is a 80 y.o. male who presents to the [facility] complaining of an abscess on his chest that became painful 10 days ago after being present for sev days.  Patient states that he has not taken or tried anything for the abscess. Patient states that he has had some yellow drainage from the abscess. Had an abscess on his back s/p I&D 1 yr prev.  Patient a secondary complaint of ear pain bilaterally.  Patient notes some associated tinnitus and ear fullness. Patient has not been taken any OTC medications for the ear pain.  Thinks it might just be due to wax and would like them cleaned.   Past Medical History  Diagnosis Date  . RBBB (right bundle branch block)   . Hyperlipidemia   . Mitral valve prolapse     irregular heart beat  . Palpitations   . Pure hypercholesterolemia   . Dilated aortic root     Hx of mild  . Benign tumor of lung   . BPH (benign prostatic hyperplasia)   . Cardiomyopathy    Current Outpatient Prescriptions on File Prior to Visit  Medication Sig Dispense Refill  . aspirin 81 MG EC tablet Take 81 mg by mouth daily.        Marland Kitchen atorvastatin (LIPITOR) 40 MG tablet TAKE 1 TABLET BY MOUTH EVERY DAY  30 tablet  6  . Calcium Carbonate-Vitamin D (CALCIUM 600/VITAMIN D) 600-400 MG-UNIT per tablet Take 1 tablet by mouth 2 (two) times daily.        Marland Kitchen co-enzyme Q-10 30 MG capsule Take 30 mg by mouth 2 (two) times daily.        . diclofenac sodium (VOLTAREN) 1 % GEL Apply 4 g topically 2 (two) times daily as needed.  100 g  3  . ERYTHROMYCIN OP Apply to eye. OPHTH  OINP 3.5 MG AT NIGHT IN [facility] EYE      . fish oil-omega-3 fatty acids 1000 MG capsule Take 1 g by mouth 2 (two) times daily. Nordic naturals omega 3 TID      . fluticasone (FLONASE) 50 MCG/ACT nasal spray Place 2 sprays into the nose daily.  16 g  11  . metoprolol succinate (TOPROL XL) 25 MG 24 hr tablet TAKE 1/2 TABLET ONCE DAILY  30 tablet  11  . NON FORMULARY HYDROCTISONE BUT 0.1% CREAM 45GM PRN      . Polyethyl Glycol-Propyl Glycol (SYSTANE OP) Apply to eye as needed. And systane gel drops at night       No current facility-administered medications on file prior to visit.   No Known Allergies  Review of Systems  Constitutional: Negative for fever, chills, activity change and appetite change.  HENT: Positive for ear pain and tinnitus.   Cardiovascular: Negative for leg swelling.  Gastrointestinal: Negative for nausea, vomiting, abdominal pain, diarrhea and constipation.  Skin: Positive for rash and wound.       Abscess on chest  Neurological: Negative  for weakness and numbness.  Hematological: Negative for adenopathy. Does not bruise/bleed easily.      BP 110/64  Pulse 60  Temp(Src) 97.5 F (36.4 C) (Oral)  Resp 14  Ht 5\' 11"  (1.803 m)  Wt 175 lb 6.4 oz (79.561 kg)  BMI 24.47 kg/m2  SpO2 96% Objective:   Physical Exam  Nursing note and vitals reviewed. Constitutional: He is oriented to person, place, and time. He appears well-developed and well-nourished. No distress.  HENT:  Head: Normocephalic and atraumatic.  Bilateral canals occluded with brown soft cerumen Attempted curette removal bilaterally but successfully removed by lavage  Eyes: EOM are normal.  Neck: Neck supple. No tracheal deviation present.  Cardiovascular: Normal rate.   Pulmonary/Chest: Effort normal. No respiratory distress.  Musculoskeletal: Normal range of motion.  Neurological: He is alert and oriented to person, place, and time.  Skin: Skin is warm and dry.  Infected sebaceous cyst 7x2 cm of  erythema with a 4cm diameter nodule of fluctuate warmth and tenderness with 1 cm eschar and several pinpoint pustules    Psychiatric: He has a normal mood and affect. His behavior is normal.      Assessment & Plan:  Infected sebaceous cyst - Plan: doxycycline (VIBRAMYCIN) 100 MG capsule, Wound culture - s/p I&D today  Tinnitus of both ears - due to cerumen. RTC if recurrs.  Excessive cerumen in both ear canals  Meds ordered this encounter  Medications  . doxycycline (VIBRAMYCIN) 100 MG capsule    Sig: Take 1 capsule (100 mg total) by mouth 2 (two) times daily.    Dispense:  20 capsule    Refill:  0    I personally performed the services described in this documentation, which was scribed in my presence. The recorded information has been reviewed and considered, and addended by me as needed.  Delman Cheadle, MD MPH

## 2013-10-09 NOTE — Progress Notes (Signed)
Procedure:  Consent obtained. Local anesthesia obtained with 2% lido with epi.  Betadine prep.  #11 blade used to make a 1 2 cm incision into the fluctuant area (this was not through the pore because it was not visualized prior to the incision due to scabbing).  Purulence and sebaceous material was expressed from the area.  The sebaecous cyst capsule was removed.  The wound was packed with 1/4in plain packing and a drsg was placed.  Wound care was discussed with patient.  Recheck in 48h for packing change.  I might expect patient to have further sebaceous material and capsule to be removed at this time due to the inflammation that was present today.

## 2013-10-11 ENCOUNTER — Ambulatory Visit (INDEPENDENT_AMBULATORY_CARE_PROVIDER_SITE_OTHER): Payer: Medicare HMO | Admitting: Physician Assistant

## 2013-10-11 VITALS — BP 122/70 | HR 58 | Temp 97.7°F | Resp 17 | Ht 71.5 in | Wt 176.0 lb

## 2013-10-11 DIAGNOSIS — L089 Local infection of the skin and subcutaneous tissue, unspecified: Secondary | ICD-10-CM

## 2013-10-11 DIAGNOSIS — L723 Sebaceous cyst: Secondary | ICD-10-CM

## 2013-10-11 NOTE — Progress Notes (Signed)
Patient ID: [name] [name] MRN: [id number], DOB: 25-Nov-1930 80 y.o. Date of Encounter: 10/11/2013, 10:10 AM  Chief Complaint: Wound care   See previous note  HPI: 80 y.o. y/o male presents for wound care s/p I&D on 09/09/13 Doing well No issues or complaints Afebrile/ no chills No nausea or vomiting Tolerating doxycycline.  Pain improving.  Daily dressing change Previous note reviewed  Past Medical History  Diagnosis Date  . RBBB (right bundle branch block)   . Hyperlipidemia   . Mitral valve prolapse     irregular heart beat  . Palpitations   . Pure hypercholesterolemia   . Dilated aortic root     Hx of mild  . Benign tumor of lung   . BPH (benign prostatic hyperplasia)   . Cardiomyopathy      Home Meds: Prior to Admission medications   Medication Sig Start Date End Date Taking? Authorizing Provider  aspirin 81 MG EC tablet Take 81 mg by mouth daily.     Yes Historical Provider, MD  atorvastatin (LIPITOR) 40 MG tablet TAKE 1 TABLET BY MOUTH EVERY DAY 02/11/13  Yes Lelon Perla, MD  Calcium Carbonate-Vitamin D (CALCIUM 600/VITAMIN D) 600-400 MG-UNIT per tablet Take 1 tablet by mouth 2 (two) times daily.     Yes Historical Provider, MD  co-enzyme Q-10 30 MG capsule Take 30 mg by mouth 2 (two) times daily.     Yes Historical Provider, MD  diclofenac sodium (VOLTAREN) 1 % GEL Apply 4 g topically 2 (two) times daily as needed. 01/31/13  Yes [name], MD  doxycycline (VIBRAMYCIN) 100 MG capsule Take 1 capsule (100 mg total) by mouth 2 (two) times daily. 10/09/13  Yes Shawnee Knapp, MD  ERYTHROMYCIN OP Apply to eye. OPHTH OINP 3.5 MG AT NIGHT IN [facility] EYE   Yes Historical Provider, MD  fish oil-omega-3 fatty acids 1000 MG capsule Take 1 g by mouth 2 (two) times daily. Nordic naturals omega 3 TID   Yes Historical Provider, MD  fluticasone (FLONASE) 50 MCG/ACT nasal spray Place 2 sprays into the nose daily. 10/08/12  Yes [name], MD  metoprolol succinate (TOPROL XL) 25 MG 24 hr  tablet TAKE 1/2 TABLET ONCE DAILY 03/20/13  Yes Lelon Perla, MD  NON FORMULARY HYDROCTISONE BUT 0.1% CREAM 45GM PRN   Yes Historical Provider, MD  Polyethyl Glycol-Propyl Glycol (SYSTANE OP) Apply to eye as needed. And systane gel drops at night   Yes Historical Provider, MD    Allergies: No Known Allergies  ROS: Constitutional: Afebrile, no chills Cardiovascular: negative for chest pain or palpitations Dermatological: Positive for wound. Negative for erythema, pain, or warmth.  GI: No nausea or vomiting   EXAM: Physical Exam: Blood pressure 122/70, pulse 58, temperature 97.7 F (36.5 C), temperature source Oral, resp. rate 17, height 5' 11.5" (1.816 m), weight 176 lb (79.833 kg), SpO2 97.00%., Body mass index is 24.21 kg/(m^2). General: Well developed, well nourished, in no acute distress. Nontoxic appearing. Head: Normocephalic, atraumatic, sclera non-icteric.  Neck: Supple. Lungs: Breathing is unlabored. Heart: Normal rate. Skin:  Warm and moist. Dressing and packing in place. No induration, erythema, or tenderness to palpation. Neuro: Alert and oriented X 3. Moves all extremities spontaneously. Normal gait.  Psych:  Responds to questions appropriately with a normal affect.       PROCEDURE: Dressing and packing removed. No purulence expressed Wound bed healthy.  No evidence of sebaceous material or remaining capsule.  Irrigated with 1% plain  lidocaine 5 cc. Repacked with 1/4 plain packing.  Dressing applied  LAB: Culture: pending  [name]: 80 y.o. y/o male with infected sebaceous cyst s/p I&D on 09/09/13.  Wound care per above Continue doxycycline.  Pain well controlled Daily dressing changes Recheck 48 hours  Signed, Georgiann Mccoy, PA-C 10/11/2013 10:10 AM

## 2013-10-12 ENCOUNTER — Other Ambulatory Visit: Payer: Self-pay | Admitting: Emergency Medicine

## 2013-10-12 LAB — WOUND CULTURE: Gram Stain: NONE SEEN

## 2013-10-13 ENCOUNTER — Ambulatory Visit (INDEPENDENT_AMBULATORY_CARE_PROVIDER_SITE_OTHER): Payer: Medicare HMO | Admitting: Physician Assistant

## 2013-10-13 VITALS — BP 120/70 | HR 55 | Temp 97.4°F | Resp 16 | Ht 71.5 in | Wt 178.0 lb

## 2013-10-13 DIAGNOSIS — L089 Local infection of the skin and subcutaneous tissue, unspecified: Secondary | ICD-10-CM

## 2013-10-13 DIAGNOSIS — L723 Sebaceous cyst: Secondary | ICD-10-CM

## 2013-10-13 NOTE — Progress Notes (Signed)
   Subjective:    Patient ID: [name] [name], male    DOB: [birth year], 80 y.o.   MRN: [id number]  HPI Pt here for packing change from I&D on 5/9.  He has had decreased pain over the last couple of days.  He is tolerating the abx ok.    Review of Systems     Objective:   Physical Exam  Vitals reviewed. Constitutional: He appears well-developed and well-nourished.  HENT:  Head: Normocephalic and atraumatic.  Right Ear: External ear normal.  Left Ear: External ear normal.  Pulmonary/Chest: Effort normal.  Skin: Skin is warm and dry.  Drsg and packing removed.  Sebaceous material expressed on the superior aspect of the wound cavity under the sebaceous pore.    Psychiatric: He has a normal mood and affect. His behavior is normal. Judgment and thought content normal.   Procedure: Irrigated with 2% lido plain.  Repacked with 1/2 in plain packing.  Drsg placed.    Assessment & Plan:  Infected sebaceous cyst Continue abx.  Change drsg daily.  Recheck in 48h.  Windell Hummingbird PA-C  [facility] 10/13/2013 9:18 AM

## 2013-10-15 ENCOUNTER — Ambulatory Visit (INDEPENDENT_AMBULATORY_CARE_PROVIDER_SITE_OTHER): Payer: Medicare HMO | Admitting: Physician Assistant

## 2013-10-15 VITALS — BP 105/68 | HR 51 | Temp 97.5°F | Resp 18 | Wt 177.0 lb

## 2013-10-15 DIAGNOSIS — L089 Local infection of the skin and subcutaneous tissue, unspecified: Secondary | ICD-10-CM

## 2013-10-15 DIAGNOSIS — L723 Sebaceous cyst: Secondary | ICD-10-CM

## 2013-10-15 NOTE — Progress Notes (Signed)
   Subjective:    Patient ID: [name] [name], male    DOB: Aug 29, 1930, 80 y.o.   MRN: [id number]  HPI   80y.o male presents for wound check from I&D of sebaceous cyst on 5/7.  Pt is still taking doxycyclin as prescribed and tolerating well.  Has not been changing the dressing after showering.  This morning the bandage fell off so he recovered it with a bandaid.  Redness to area has been decreasing in diameter.  Redness in pattern of bandage but denies wound draining, warmth, or pain.  Denies fever, N/V/D, urinary changes, abd pain.  Review of Systems  Constitutional: Negative for fever, chills, diaphoresis and fatigue.  Eyes: Negative.   Respiratory: Negative for cough, chest tightness, shortness of breath and wheezing.   Gastrointestinal: Negative for nausea, vomiting, abdominal pain, diarrhea and abdominal distention.  Endocrine: Negative for polyuria.  Genitourinary: Negative.   Musculoskeletal: Negative for arthralgias and myalgias.  Skin: Negative.  Negative for rash.  Allergic/Immunologic: Negative.   Neurological: Negative for dizziness, weakness and headaches.  Hematological: Negative.        Objective:   Physical Exam  Constitutional: He is oriented to person, place, and time. He appears well-developed and well-nourished. No distress.  BP 105/68  Pulse 51  Temp(Src) 97.5 F (36.4 C) (Oral)  Resp 18  Wt 177 lb (80.287 kg)  SpO2 98%   HENT:  Head: Normocephalic.  Eyes: Conjunctivae are normal. Pupils are equal, round, and reactive to light.  Cardiovascular: Normal rate and intact distal pulses.   Pulmonary/Chest: Effort normal. No respiratory distress.  Neurological: He is alert and oriented to person, place, and time.  Skin: Skin is warm and dry. No rash noted.  Psychiatric: He has a normal mood and affect. His behavior is normal.    Bandage and packing removed.  Blood without sebaceous material expressed from wound with manipulation.  Irrigated with 2% lidocaine  plain.  Repacked with 1/4" plain packing.  Dressing applied.     Assessment & Plan:   1. Infected sebaceous cyst Continue abx.  Wound with surrounding irritation likely from wet bandage.  Pt instructed to change dressing daily.  Return in 4 days for recheck

## 2013-10-15 NOTE — Progress Notes (Signed)
I was directly involved in the patient's care and actively participated during the procedure.  

## 2013-10-19 ENCOUNTER — Ambulatory Visit (INDEPENDENT_AMBULATORY_CARE_PROVIDER_SITE_OTHER): Payer: Medicare HMO | Admitting: Physician Assistant

## 2013-10-19 VITALS — BP 118/66 | HR 73 | Temp 97.4°F | Resp 18 | Ht 72.0 in | Wt 177.0 lb

## 2013-10-19 DIAGNOSIS — L723 Sebaceous cyst: Secondary | ICD-10-CM

## 2013-10-19 DIAGNOSIS — L089 Local infection of the skin and subcutaneous tissue, unspecified: Secondary | ICD-10-CM

## 2013-10-19 NOTE — Progress Notes (Signed)
Patient ID: [name] [name] MRN: [id number], DOB: Dec 09, 1930 80 y.o. Date of Encounter: 10/19/2013, 8:17 AM  Primary Physician: Jenny Reichmann, MD  Chief Complaint: Wound care   See previous note  HPI: 80 y.o. male presents for wound care s/p I&D on 10/08/13 Doing well No issues or complaints Afebrile/ no chills No nausea or vomiting Finished doxycycline this morning Pain resolved Daily dressing change Previous note reviewed  Past Medical History  Diagnosis Date  . RBBB (right bundle branch block)   . Hyperlipidemia   . Mitral valve prolapse     irregular heart beat  . Palpitations   . Pure hypercholesterolemia   . Dilated aortic root     Hx of mild  . Benign tumor of lung   . BPH (benign prostatic hyperplasia)   . Cardiomyopathy      Home Meds: Prior to Admission medications   Medication Sig Start Date End Date Taking? Authorizing Provider  aspirin 81 MG EC tablet Take 81 mg by mouth daily.     Yes Historical Provider, MD  atorvastatin (LIPITOR) 40 MG tablet TAKE 1 TABLET BY MOUTH EVERY DAY 02/11/13  Yes Lelon Perla, MD  Calcium Carbonate-Vitamin D (CALCIUM 600/VITAMIN D) 600-400 MG-UNIT per tablet Take 1 tablet by mouth 2 (two) times daily.     Yes Historical Provider, MD  co-enzyme Q-10 30 MG capsule Take 30 mg by mouth 2 (two) times daily.     Yes Historical Provider, MD  doxycycline (VIBRAMYCIN) 100 MG capsule Take 1 capsule (100 mg total) by mouth 2 (two) times daily. 10/09/13  Yes Shawnee Knapp, MD  ERYTHROMYCIN OP Apply to eye. OPHTH OINP 3.5 MG AT NIGHT IN [facility] EYE   Yes Historical Provider, MD  fish oil-omega-3 fatty acids 1000 MG capsule Take 1 g by mouth 2 (two) times daily. Nordic naturals omega 3 TID   Yes Historical Provider, MD  fluticasone (FLONASE) 50 MCG/ACT nasal spray Place 2 sprays into both nostrils daily. PATIENT NEEDS OFFICE VISIT FOR ADDITIONAL REFILLS   Yes [name], MD  metoprolol succinate (TOPROL XL) 25 MG 24 hr tablet TAKE 1/2 TABLET  ONCE DAILY 03/20/13  Yes Lelon Perla, MD  NON FORMULARY HYDROCTISONE BUT 0.1% CREAM 45GM PRN   Yes Historical Provider, MD  Polyethyl Glycol-Propyl Glycol (SYSTANE OP) Apply to eye as needed. And systane gel drops at night   Yes Historical Provider, MD  diclofenac sodium (VOLTAREN) 1 % GEL Apply 4 g topically 2 (two) times daily as needed. 01/31/13   [name], MD    Allergies: No Known Allergies  ROS: Constitutional: Afebrile, no chills Cardiovascular: negative for chest pain or palpitations Dermatological: Positive for wound. Negative for erythema, pain, or warmth  GI: No nausea or vomiting   EXAM: Physical Exam: Blood pressure 118/66, pulse 73, temperature 97.4 F (36.3 C), temperature source Oral, resp. rate 18, height 6' (1.829 m), weight 177 lb (80.287 kg), SpO2 97.00%., Body mass index is 24 kg/(m^2). General: Well developed, well nourished, in no acute distress. Nontoxic appearing. Head: Normocephalic, atraumatic, sclera non-icteric.  Neck: Supple. Lungs: Breathing is unlabored. Heart: Normal rate. Skin:  Warm and moist. Dressing and packing in place. No induration, erythema, or tenderness to palpation. Neuro: Alert and oriented X 3. Moves all extremities spontaneously. Normal gait.  Psych:  Responds to questions appropriately with a normal affect.   PROCEDURE: Dressing and small amount of packing removed. No purulence expressed Wound bed healthy Irrigated with  1% plain lidocaine 5 cc. No further packing needed Dressing applied  LAB: Culture: Multiple organisms present, none predominant   [name]: 80 y.o. male with resolved cellulitis/abscess as above s/p I&D on 10/08/13 -Wound care per above -Has finished doxycycline -Pain resolved -Daily dressing changes -RTC prn  Signed, Christell Faith, MHS, PA-C [facility] [place] [place] [address] [place] 10/19/2013 8:17 AM

## 2013-11-06 ENCOUNTER — Other Ambulatory Visit: Payer: Self-pay | Admitting: Cardiology

## 2013-11-21 ENCOUNTER — Other Ambulatory Visit: Payer: Self-pay | Admitting: Cardiology

## 2013-11-21 ENCOUNTER — Other Ambulatory Visit: Payer: Self-pay | Admitting: Emergency Medicine

## 2013-12-09 ENCOUNTER — Ambulatory Visit (INDEPENDENT_AMBULATORY_CARE_PROVIDER_SITE_OTHER): Payer: Medicare HMO | Admitting: Emergency Medicine

## 2013-12-09 ENCOUNTER — Encounter: Payer: Self-pay | Admitting: Emergency Medicine

## 2013-12-09 VITALS — BP 110/65 | HR 63 | Temp 97.5°F | Resp 16 | Ht 71.5 in | Wt 174.0 lb

## 2013-12-09 DIAGNOSIS — M25552 Pain in left hip: Secondary | ICD-10-CM

## 2013-12-09 DIAGNOSIS — E785 Hyperlipidemia, unspecified: Secondary | ICD-10-CM

## 2013-12-09 DIAGNOSIS — Z125 Encounter for screening for malignant neoplasm of prostate: Secondary | ICD-10-CM

## 2013-12-09 DIAGNOSIS — Z0000 Encounter for general adult medical examination without abnormal findings: Secondary | ICD-10-CM

## 2013-12-09 DIAGNOSIS — M25559 Pain in unspecified hip: Secondary | ICD-10-CM

## 2013-12-09 DIAGNOSIS — L0293 Carbuncle, unspecified: Secondary | ICD-10-CM

## 2013-12-09 DIAGNOSIS — Z23 Encounter for immunization: Secondary | ICD-10-CM

## 2013-12-09 DIAGNOSIS — L0292 Furuncle, unspecified: Secondary | ICD-10-CM

## 2013-12-09 DIAGNOSIS — T8130XA Disruption of wound, unspecified, initial encounter: Secondary | ICD-10-CM

## 2013-12-09 LAB — POCT URINALYSIS DIPSTICK
BILIRUBIN UA: NEGATIVE
Blood, UA: NEGATIVE
Glucose, UA: NEGATIVE
KETONES UA: NEGATIVE
LEUKOCYTES UA: NEGATIVE
Nitrite, UA: NEGATIVE
PROTEIN UA: NEGATIVE
Spec Grav, UA: 1.015
Urobilinogen, UA: 0.2
pH, UA: 7.5

## 2013-12-09 LAB — IFOBT (OCCULT BLOOD): IFOBT: NEGATIVE

## 2013-12-09 MED ORDER — DOXYCYCLINE HYCLATE 100 MG PO TABS: 100.0000 mg | ORAL_TABLET | Freq: Two times a day (BID) | ORAL | Status: DC

## 2013-12-09 NOTE — Progress Notes (Signed)
   Subjective:    Patient ID: [name] [name], male    DOB: 04/11/1931, 80 y.o.   MRN: [id number]  HPI  Patient is doing ok.  He is still having trouble with his knee.  Patient has previously seen [facility].  Has a cyst on right side of chest that has been draining.  He has pushed the fluid out of it and has been using peroxide.  Taking his bp meds.  Sees Dr. Stanford Breed once a year.  Also sees Dr. Simona Huh.  Goes to eye doctor once a year.  Has issues with dry eyes.  Patient is up to date with his colonoscopy.  Had one two years ago.      Review of Systems     Objective:   Physical Exam        Assessment & Plan:

## 2013-12-09 NOTE — Progress Notes (Signed)
   Subjective:    Patient ID: Jonette Mate, male    DOB: [birth year], 80 y.o.   MRN: [id number]  HPI patient presents for a physical exam     Review of Systems  Eyes: Positive for discharge.  Musculoskeletal: Positive for arthralgias.  Skin: Positive for rash.       Objective:   Physical Exam HEENT exam is unremarkable. Neck supple. Chest clear heart regular rate without murmurs abdomen soft extremities without cyanosis clubbing or edema. Pulses are 2+ and symmetrical. There is a 1 x 0.5 cm cystic area right lower chest. There is drainage from this area. There degenerative changes rounding left knee.    Results for orders placed in visit on 12/09/13  POCT URINALYSIS DIPSTICK      Result Value Ref Range   Color, UA yellow     Clarity, UA clear     Glucose, UA neg     Bilirubin, UA neg     Ketones, UA neg     Spec Grav, UA 1.015     Blood, UA neg     pH, UA 7.5     Protein, UA neg     Urobilinogen, UA 0.2     Nitrite, UA neg     Leukocytes, UA Negative    IFOBT (OCCULT BLOOD)      Result Value Ref Range   IFOBT Negative        Assessment & Plan:  Patient has an infected boil on his chest wall. He also has significant arthritis in his hip and knee. Referral made to [facility] to evaluate this. No other changes

## 2013-12-10 LAB — CBC WITH DIFFERENTIAL/PLATELET
BASOS PCT: 0 % (ref 0–1)
Basophils Absolute: 0 10*3/uL (ref 0.0–0.1)
EOS ABS: 0.1 10*3/uL (ref 0.0–0.7)
Eosinophils Relative: 3 % (ref 0–5)
HEMATOCRIT: 43.6 % (ref 39.0–52.0)
Hemoglobin: 15.2 g/dL (ref 13.0–17.0)
Lymphocytes Relative: 24 % (ref 12–46)
Lymphs Abs: 1.1 10*3/uL (ref 0.7–4.0)
MCH: 30.8 pg (ref 26.0–34.0)
MCHC: 34.9 g/dL (ref 30.0–36.0)
MCV: 88.3 fL (ref 78.0–100.0)
MONO ABS: 0.5 10*3/uL (ref 0.1–1.0)
Monocytes Relative: 11 % (ref 3–12)
NEUTROS PCT: 62 % (ref 43–77)
Neutro Abs: 2.8 10*3/uL (ref 1.7–7.7)
Platelets: 170 10*3/uL (ref 150–400)
RBC: 4.94 MIL/uL (ref 4.22–5.81)
RDW: 13.5 % (ref 11.5–15.5)
WBC: 4.5 10*3/uL (ref 4.0–10.5)

## 2013-12-10 LAB — LIPID PANEL
CHOLESTEROL: 121 mg/dL (ref 0–200)
HDL: 44 mg/dL (ref >39)
LDL Cholesterol: 62 mg/dL (ref 0–99)
Total CHOL/HDL Ratio: 2.8 Ratio
Triglycerides: 73 mg/dL (ref <150)
VLDL: 15 mg/dL (ref 0–40)

## 2013-12-10 LAB — COMPLETE METABOLIC PANEL WITH GFR
ALBUMIN: 4.4 g/dL (ref 3.5–5.2)
ALK PHOS: 68 U/L (ref 39–117)
ALT: 17 U/L (ref 0–53)
AST: 21 U/L (ref 0–37)
BILIRUBIN TOTAL: 1.3 mg/dL — AB (ref 0.2–1.2)
BUN: 14 mg/dL (ref 6–23)
CO2: 30 meq/L (ref 19–32)
Calcium: 9.6 mg/dL (ref 8.4–10.5)
Chloride: 102 mEq/L (ref 96–112)
Creat: 0.89 mg/dL (ref 0.50–1.35)
GFR, EST NON AFRICAN AMERICAN: 80 mL/min
GFR, Est African American: >89 mL/min
GLUCOSE: 81 mg/dL (ref 70–99)
POTASSIUM: 4.3 meq/L (ref 3.5–5.3)
SODIUM: 141 meq/L (ref 135–145)
Total Protein: 6.3 g/dL (ref 6.0–8.3)

## 2013-12-10 LAB — PSA, MEDICARE: PSA: 0.34 ng/mL (ref <=4.00)

## 2013-12-12 LAB — WOUND CULTURE
Gram Stain: NONE SEEN
Gram Stain: NONE SEEN
Gram Stain: NONE SEEN

## 2013-12-18 ENCOUNTER — Encounter: Payer: Self-pay | Admitting: Cardiology

## 2013-12-18 ENCOUNTER — Encounter: Payer: Self-pay | Admitting: *Deleted

## 2013-12-18 ENCOUNTER — Ambulatory Visit (INDEPENDENT_AMBULATORY_CARE_PROVIDER_SITE_OTHER): Payer: Medicare HMO | Admitting: Cardiology

## 2013-12-18 VITALS — BP 102/70 | HR 58 | Ht 72.0 in | Wt 175.0 lb

## 2013-12-18 DIAGNOSIS — I428 Other cardiomyopathies: Secondary | ICD-10-CM

## 2013-12-18 DIAGNOSIS — E7800 Pure hypercholesterolemia, unspecified: Secondary | ICD-10-CM

## 2013-12-18 DIAGNOSIS — I712 Thoracic aortic aneurysm, without rupture, unspecified: Secondary | ICD-10-CM

## 2013-12-18 DIAGNOSIS — I429 Cardiomyopathy, unspecified: Secondary | ICD-10-CM

## 2013-12-18 DIAGNOSIS — R002 Palpitations: Secondary | ICD-10-CM

## 2013-12-18 NOTE — Progress Notes (Signed)
HPI: FU palpitations, cardiomyopathy and family history of abdominal aortic aneurysm. He had an abdominal ultrasound in July 2008 that showed no abdominal aortic aneurysm. Carotid dopplers were performed on January 01, 2008. He was found to have normal carotid arteries bilaterally. An echocardiogram was repeated in July of 2013. His ejection fraction was felt to be 40-45%; minimal pericardial effusion; mild RAE and moderate RVE; mild TR. There was mild aortic root dilatation. Myoview was repeated in September of 2013. Ejection fraction was 53% and the perfusion was normal. TSH and ferritin were normal in August of 2013. PFTs in September of 2013 showed mild obstructive defect. CTA in July of 2013 showed no aneurysm. There was note of 6 mm right lower lobe nodule and followup recommended in 6 months. There is also a small nodule in the right major fissure and followup recommended. FU CT in Jan 2014 showed less prominent nodule and no FU recommended. Since I last saw him in May 2014, the patient has dyspnea with more extreme activities but not with routine activities. It is relieved with rest. It is not associated with chest pain. There is no orthopnea, PND or pedal edema. There is no syncope or palpitations. There is no exertional chest pain.   Current Outpatient Prescriptions  Medication Sig Dispense Refill  . aspirin 81 MG EC tablet Take 81 mg by mouth daily.        Marland Kitchen atorvastatin (LIPITOR) 40 MG tablet TAKE 1 TABLET BY MOUTH DAILY  30 tablet  0  . Calcium Carbonate-Vitamin D (CALCIUM 600/VITAMIN D) 600-400 MG-UNIT per tablet Take 1 tablet by mouth 2 (two) times daily.        Marland Kitchen co-enzyme Q-10 30 MG capsule Take 30 mg by mouth 2 (two) times daily.        Marland Kitchen doxycycline (VIBRA-TABS) 100 MG tablet Take 1 tablet (100 mg total) by mouth 2 (two) times daily.  20 tablet  0  . fish oil-omega-3 fatty acids 1000 MG capsule Take 1 g by mouth 2 (two) times daily. Nordic naturals omega 3 TID      . fluticasone  (FLONASE) 50 MCG/ACT nasal spray USE 2 SPRAYS IN BOTH NOSTRILS EVERY DAY  16 g  5  . metoprolol succinate (TOPROL XL) 25 MG 24 hr tablet TAKE 1/2 TABLET ONCE DAILY  30 tablet  11  . NON FORMULARY HYDROCTISONE BUT 0.1% CREAM 45GM PRN      . Polyethyl Glycol-Propyl Glycol (SYSTANE OP) Apply to eye as needed. And systane gel drops at night       No current facility-administered medications for this visit.     Past Medical History  Diagnosis Date  . RBBB (right bundle branch block)   . Hyperlipidemia   . Mitral valve prolapse     irregular heart beat  . Palpitations   . Pure hypercholesterolemia   . Dilated aortic root     Hx of mild  . Benign tumor of lung   . BPH (benign prostatic hyperplasia)   . Cardiomyopathy     Past Surgical History  Procedure Laterality Date  . Excision on neck      Excision of 2 cm soft tissue mass of left neck  . Resection of lung      Hx of, right lung secondary to benign tumor  . Testicular hernia    . Shoulder arthroscopy      Lt  . Cosmetic surgery    . Vasectomy  History   Social History  . Marital Status: Married    Spouse Name: N/A    Number of Children: N/A  . Years of Education: N/A   Occupational History  . [place] History Main Topics  . Smoking status: Former Research scientist (life sciences)  . Smokeless tobacco: Never Used     Comment: quit 80's  . Alcohol Use: No  . Drug Use: No  . Sexual Activity: No   Other Topics Concern  . Not on file   Social History Narrative   Exercise 20-30 min walks. Patient is married. Education: [employer].     ROS: no fevers or chills, productive cough, hemoptysis, dysphasia, odynophagia, melena, hematochezia, dysuria, hematuria, rash, seizure activity, orthopnea, PND, pedal edema, claudication. Remaining systems are negative.  Physical Exam: Well-developed well-nourished in no acute distress.  Skin is warm and dry.  HEENT is normal.  Neck is supple.  Chest is clear to auscultation with normal  expansion.  Cardiovascular exam is regular rate and rhythm.  Abdominal exam nontender or distended. No masses palpated. Extremities show no edema. neuro grossly intact  ECG Sinus rhythm at a rate of 58. Right bundle branch block

## 2013-12-18 NOTE — Patient Instructions (Signed)

## 2013-12-18 NOTE — Assessment & Plan Note (Signed)
Mildly dilated aortic root previous echocardiogram. Repeat study.

## 2013-12-18 NOTE — Assessment & Plan Note (Signed)
No recent palpitations. Continue beta blocker.

## 2013-12-18 NOTE — Assessment & Plan Note (Signed)
Mildly reduced previous echo. Repeat study. Continue beta blocker.

## 2013-12-18 NOTE — Assessment & Plan Note (Signed)
Continue statin. Check lipids and liver. 

## 2013-12-25 ENCOUNTER — Ambulatory Visit (HOSPITAL_COMMUNITY)
Admission: RE | Admit: 2013-12-25 | Discharge: 2013-12-25 | Disposition: A | Discharge status: Home / Self Care | Payer: Medicare HMO | Source: Ambulatory Visit | Attending: Cardiology | Admitting: Cardiology

## 2013-12-25 DIAGNOSIS — R002 Palpitations: Secondary | ICD-10-CM

## 2013-12-25 DIAGNOSIS — I712 Thoracic aortic aneurysm, without rupture, unspecified: Secondary | ICD-10-CM

## 2013-12-25 DIAGNOSIS — I428 Other cardiomyopathies: Secondary | ICD-10-CM | POA: Diagnosis not present

## 2013-12-25 DIAGNOSIS — I369 Nonrheumatic tricuspid valve disorder, unspecified: Secondary | ICD-10-CM

## 2013-12-25 DIAGNOSIS — I429 Cardiomyopathy, unspecified: Secondary | ICD-10-CM

## 2013-12-25 NOTE — Progress Notes (Signed)
2D Echocardiogram Complete.  12/25/2013   [name] [name], RDCS

## 2014-02-07 ENCOUNTER — Other Ambulatory Visit: Payer: Self-pay | Admitting: Cardiology

## 2014-02-11 ENCOUNTER — Other Ambulatory Visit: Payer: Self-pay

## 2014-02-11 MED ORDER — ATORVASTATIN CALCIUM 40 MG PO TABS: 40.0000 mg | ORAL_TABLET | Freq: Every day | ORAL | Status: DC

## 2014-04-19 ENCOUNTER — Other Ambulatory Visit: Payer: Self-pay | Admitting: Cardiology

## 2014-08-26 ENCOUNTER — Other Ambulatory Visit: Payer: Self-pay | Admitting: Physician Assistant

## 2014-09-29 ENCOUNTER — Ambulatory Visit (INDEPENDENT_AMBULATORY_CARE_PROVIDER_SITE_OTHER): Payer: Medicare HMO | Admitting: Emergency Medicine

## 2014-09-29 ENCOUNTER — Encounter: Payer: Self-pay | Admitting: Emergency Medicine

## 2014-09-29 VITALS — BP 111/68 | HR 74 | Temp 97.9°F | Resp 16 | Ht 72.0 in | Wt 177.6 lb

## 2014-09-29 DIAGNOSIS — Z125 Encounter for screening for malignant neoplasm of prostate: Secondary | ICD-10-CM

## 2014-09-29 DIAGNOSIS — Z1211 Encounter for screening for malignant neoplasm of colon: Secondary | ICD-10-CM | POA: Diagnosis not present

## 2014-09-29 DIAGNOSIS — K4090 Unilateral inguinal hernia, without obstruction or gangrene, not specified as recurrent: Secondary | ICD-10-CM | POA: Insufficient documentation

## 2014-09-29 DIAGNOSIS — R911 Solitary pulmonary nodule: Secondary | ICD-10-CM | POA: Diagnosis not present

## 2014-09-29 DIAGNOSIS — E785 Hyperlipidemia, unspecified: Secondary | ICD-10-CM

## 2014-09-29 DIAGNOSIS — K4020 Bilateral inguinal hernia, without obstruction or gangrene, not specified as recurrent: Secondary | ICD-10-CM | POA: Diagnosis not present

## 2014-09-29 DIAGNOSIS — Z0000 Encounter for general adult medical examination without abnormal findings: Secondary | ICD-10-CM

## 2014-09-29 DIAGNOSIS — I10 Essential (primary) hypertension: Secondary | ICD-10-CM

## 2014-09-29 DIAGNOSIS — K4021 Bilateral inguinal hernia, without obstruction or gangrene, recurrent: Secondary | ICD-10-CM

## 2014-09-29 LAB — LIPID PANEL
Cholesterol: 119 mg/dL (ref 0–200)
HDL: 40 mg/dL (ref >=40)
LDL Cholesterol: 59 mg/dL (ref 0–99)
Total CHOL/HDL Ratio: 3 Ratio
Triglycerides: 99 mg/dL (ref <150)
VLDL: 20 mg/dL (ref 0–40)

## 2014-09-29 LAB — CBC WITH DIFFERENTIAL/PLATELET
BASOS ABS: 0 10*3/uL (ref 0.0–0.1)
Basophils Relative: 0 % (ref 0–1)
EOS ABS: 0.2 10*3/uL (ref 0.0–0.7)
Eosinophils Relative: 3 % (ref 0–5)
HEMATOCRIT: 44.9 % (ref 39.0–52.0)
Hemoglobin: 14.9 g/dL (ref 13.0–17.0)
Lymphocytes Relative: 24 % (ref 12–46)
Lymphs Abs: 1.4 10*3/uL (ref 0.7–4.0)
MCH: 30.1 pg (ref 26.0–34.0)
MCHC: 33.2 g/dL (ref 30.0–36.0)
MCV: 90.7 fL (ref 78.0–100.0)
MPV: 10.5 fL (ref 8.6–12.4)
Monocytes Absolute: 0.6 10*3/uL (ref 0.1–1.0)
Monocytes Relative: 10 % (ref 3–12)
NEUTROS PCT: 63 % (ref 43–77)
Neutro Abs: 3.6 10*3/uL (ref 1.7–7.7)
PLATELETS: 205 10*3/uL (ref 150–400)
RBC: 4.95 MIL/uL (ref 4.22–5.81)
RDW: 13.1 % (ref 11.5–15.5)
WBC: 5.7 10*3/uL (ref 4.0–10.5)

## 2014-09-29 LAB — COMPLETE METABOLIC PANEL WITH GFR
ALBUMIN: 3.8 g/dL (ref 3.5–5.2)
ALT: 13 U/L (ref 0–53)
AST: 18 U/L (ref 0–37)
Alkaline Phosphatase: 85 U/L (ref 39–117)
BUN: 12 mg/dL (ref 6–23)
CHLORIDE: 102 meq/L (ref 96–112)
CO2: 30 meq/L (ref 19–32)
Calcium: 9.5 mg/dL (ref 8.4–10.5)
Creat: 0.83 mg/dL (ref 0.50–1.35)
GFR, EST NON AFRICAN AMERICAN: 83 mL/min
GFR, Est African American: >89 mL/min
GLUCOSE: 78 mg/dL (ref 70–99)
Potassium: 4.7 mEq/L (ref 3.5–5.3)
Sodium: 140 mEq/L (ref 135–145)
Total Bilirubin: 0.8 mg/dL (ref 0.2–1.2)
Total Protein: 6.5 g/dL (ref 6.0–8.3)

## 2014-09-29 LAB — IFOBT (OCCULT BLOOD): IMMUNOLOGICAL FECAL OCCULT BLOOD TEST: NEGATIVE

## 2014-09-29 MED ORDER — ATORVASTATIN CALCIUM 40 MG PO TABS: 40.0000 mg | ORAL_TABLET | Freq: Every day | ORAL | Status: DC

## 2014-09-29 MED ORDER — FLUTICASONE PROPIONATE 50 MCG/ACT NA SUSP: NASAL | Status: DC

## 2014-09-29 MED ORDER — METOPROLOL SUCCINATE ER 25 MG PO TB24: 12.5000 mg | ORAL_TABLET | Freq: Every day | ORAL | Status: DC

## 2014-09-29 NOTE — Progress Notes (Signed)
   Subjective:    Patient ID: [name] [name], male    DOB: [birth year], 81 y.o.   MRN: [id number]  HPI    Review of Systems  Constitutional: Negative.   HENT: Negative.   Eyes: Positive for photophobia and discharge.  Respiratory: Negative.   Cardiovascular: Negative.   Gastrointestinal: Negative.   Endocrine: Negative.   Musculoskeletal: Negative.   Skin: Positive for rash.  Allergic/Immunologic: Positive for environmental allergies.  Neurological: Negative.   Hematological: Negative.   Psychiatric/Behavioral: Negative.        Objective:   Physical Exam        Assessment & Plan:

## 2014-09-29 NOTE — Progress Notes (Addendum)
   Subjective:    Patient ID: [name] [name], male    DOB: 24-May-1931, 81 y.o.   MRN: [id number] This chart was scribed for Arlyss Queen, MD by Zola Button, Medical Scribe. This patient was seen in room 22 and the patient's care was started at 1:59 PM.   HPI HPI Comments: [name] [name] is a 81 y.o. male who presents to the [facility] for a complete physical exam. Patient states he has been doing well overall. He last saw Dr. Stanford Breed last year and is due to see him this summer. Patient quit smoking about 25 years ago. At his last colonoscopy, he was told he did not need to return. He has had a malignant polyp cut out. He has had the shingles vaccine and the pneumonia vaccine. He has seen Dr. [place] Crane, dermatologist in the past.  Patient is married; his wife has been in relatively good health.  Review of Systems  Eyes: Positive for photophobia and discharge.  Skin: Positive for rash.  Allergic/Immunologic: Positive for environmental allergies.  All other systems reviewed and are negative. Reviewed per patient health survey.     Objective:   Physical Exam CONSTITUTIONAL: Well developed/well nourished HEAD: Normocephalic/atraumatic EYES: EOM/PERRL ENMT: Mucous membranes moist NECK: supple no meningeal signs SPINE: entire spine nontender CV: S1/S2 noted, no murmurs/rubs/gallops noted LUNGS: Lungs are clear to auscultation bilaterally, no apparent distress ABDOMEN: soft, nontender, no rebound or guarding GU: no cva tenderness NEURO: Pt is awake/alert, moves all extremitiesx4 EXTREMITIES: pulses normal, full ROM SKIN: warm, color normal PSYCH: no abnormalities of mood noted  Results for orders placed or performed in visit on 09/29/14  IFOBT POC (occult bld, rslt in office)  Result Value Ref Range   IFOBT Negative         Assessment & Plan:  1. Hyperlipidemia  - atorvastatin (LIPITOR) 40 MG tablet; Take 1 tablet (40 mg total) by mouth daily at 6 PM.  Dispense:  30 tablet; Refill: 11 - Lipid panel  2. Essential hypertension, benign  - metoprolol succinate (TOPROL-XL) 25 MG 24 hr tablet; Take 0.5 tablets (12.5 mg total) by mouth daily.  Dispense: 30 tablet; Refill: 11 - CBC with Differential/Platelet - COMPLETE METABOLIC PANEL WITH GFR  3. Routine general medical examination at a health care facility Patient has a healthy lifestyle does not drink or smoke walks regularly  4. Prostate cancer screening  - PSA, Medicare  5. Solitary pulmonary nodule on lung CT  - CT Chest Wo Contrast; Future patient has previous abnormal CT and needs follow-up. Be sure results to be sent to Dr. Stanford Breed.  6. Encounter for screening fecal occult blood testing  - IFOBT POC (occult bld, rslt in office)  7. Bilateral recurrent inguinal hernia without obstruction or gangrene We'll hold off on surgery at the present time.   I personally performed the services described in this documentation, which was scribed in my presence. The recorded information has been reviewed and is accurate.  Arlyss Queen, MD  [facility] [place] [facility]  09/29/2014 5:39 PM

## 2014-09-30 LAB — PSA, MEDICARE: PSA: 0.53 ng/mL (ref <=4.00)

## 2014-10-02 ENCOUNTER — Other Ambulatory Visit: Payer: Medicare HMO

## 2014-10-05 ENCOUNTER — Other Ambulatory Visit: Payer: Self-pay | Admitting: Emergency Medicine

## 2014-10-05 ENCOUNTER — Ambulatory Visit
Admission: RE | Admit: 2014-10-05 | Discharge: 2014-10-05 | Disposition: A | Discharge status: Home / Self Care | Payer: Medicare HMO | Source: Ambulatory Visit | Attending: Emergency Medicine | Admitting: Emergency Medicine

## 2014-10-05 DIAGNOSIS — R911 Solitary pulmonary nodule: Secondary | ICD-10-CM

## 2014-10-05 DIAGNOSIS — R918 Other nonspecific abnormal finding of lung field: Secondary | ICD-10-CM

## 2014-10-26 ENCOUNTER — Ambulatory Visit (INDEPENDENT_AMBULATORY_CARE_PROVIDER_SITE_OTHER): Payer: Medicare HMO | Admitting: Internal Medicine

## 2014-10-26 ENCOUNTER — Encounter: Payer: Self-pay | Admitting: Internal Medicine

## 2014-10-26 VITALS — BP 126/70 | HR 59 | Ht 73.0 in | Wt 177.0 lb

## 2014-10-26 DIAGNOSIS — R938 Abnormal findings on diagnostic imaging of other specified body structures: Secondary | ICD-10-CM

## 2014-10-26 DIAGNOSIS — R9389 Abnormal findings on diagnostic imaging of other specified body structures: Secondary | ICD-10-CM

## 2014-10-26 NOTE — Progress Notes (Signed)
Subjective:    Patient ID: [name] [name], male    DOB: December 27, 1930    MRN: [id number]  HPI  47 yowm quit smoking 1985 eval by [name] [name] >  R lung "collapse/blockage" >  R lung surgery around 1985 (? [name] [name]) dx "benign tumor"  With no chemo/RT, followed by [name] [name] for  A few years then no further specialty f/u but CT chest 10/05/14 suggested new changes in RLL so referred to pulmonary clinic 10/26/14 by [name] [name].    10/26/2014 f/u ov/[name] [name] re:  Chief Complaint  Patient presents with  . Pulmonary Consult    Referred by [name] [name]. Pt states that mass on lung was noticied 2 years ago and was stable until now. Pt stated that he was told mass has increased. Denies any breathing problems during visit   Not limited by breathing from desired activities  And quite active but not aerobic    No   assoc chronic cough or cp or chest tightness, subjective wheeze overt sinus or hb symptoms. No unusual exp hx or h/o childhood pna/ asthma or knowledge of premature birth.  Sleeping ok without nocturnal  or early am exacerbation  of respiratory  c/o's or need for noct saba. Also denies any obvious fluctuation of symptoms with weather or environmental changes or other aggravating or alleviating factors except as outlined above   Current Medications, Allergies, Complete Past Medical History, Past Surgical History, Family History, and Social History were reviewed in [employer] record.   .       Review of Systems  Constitutional: Negative for fever, chills, activity change, appetite change and unexpected weight change.  HENT: Negative for congestion, dental problem, postnasal drip, rhinorrhea, sneezing, sore throat, trouble swallowing and voice change.   Eyes: Negative for visual disturbance.  Respiratory: Negative for cough, choking and shortness of breath.   Cardiovascular: Negative for chest pain and leg swelling.  Gastrointestinal: Negative for nausea, vomiting and  abdominal pain.  Genitourinary: Negative for difficulty urinating.  Musculoskeletal: Negative for arthralgias.  Skin: Negative for rash.  Psychiatric/Behavioral: Negative for behavioral problems and confusion.       Objective:   Physical Exam  amb wm nad   Wt Readings from Last 3 Encounters:  10/26/14 177 lb (80.287 kg)  09/29/14 177 lb 9.6 oz (80.559 kg)  12/18/13 175 lb (79.379 kg)    Vital signs reviewed    HEENT: nl dentition, turbinates, and orophanx. Nl external ear canals without cough reflex   NECK :  without JVD/Nodes/TM/ nl carotid upstrokes bilaterally   LUNGS: no acc muscle use, decreased BS on Right without localized wheeze    CV:  RRR  no s3 or murmur or increase in P2, no edema   ABD:  soft and nontender with nl excursion in the supine position. No bruits or organomegaly, bowel sounds nl  MS:  warm without deformities, calf tenderness, cyanosis or clubbing  SKIN: warm and dry without lesions    NEURO:  alert, approp, no deficits      I personally reviewed images and agree with radiology impression as follows:  CT s contrast  10/05/14  There are changes from the most recent prior CT. In the right lower lobe there is a focal area of opacity that is pleural-based. This lies laterally measuring 3 cm x 2.8 cm in greatest transverse dimension. This is likely rounded atelectasis. It could be inflammatory or infectious in origin. Neoplastic disease is felt unlikely, but  not excluded. There are coarse reticular, peribronchovascular opacities as well of bronchial wall thickening in the lateral right lower lobe, which supports an inflammatory or infectious etiology. Small new area of reticular nodular opacity in the left upper lobe, which also has an inflammatory or infectious appearance. 2. There are several right lung nodules which are stable from the prior CT. No new lung nodules. 3. Stable calcified lymph nodes along the mediastinum consistent with  healed granulomatous disease. Stable postsurgical changes along the superior right hilum               Assessment & Plan:

## 2014-10-26 NOTE — Patient Instructions (Signed)
We will call you the first week in Nov 2016 to do a follow up CT chest - call us in meantime for new symptoms cough/ wheeze/ pain when you breath

## 2014-10-27 ENCOUNTER — Encounter: Payer: Self-pay | Admitting: Internal Medicine

## 2014-10-27 NOTE — Assessment & Plan Note (Addendum)
I had an extended discussion with the patient reviewing all relevant studies completed to date and  lasting30 minutes of a 45 minute visit on the following ongoing concerns:    1) we do not have records far enough back to know what the original dx was in the mid 80s leading to partial resection involving the R Lung but could have been a carcinoid which would be very unlikey to have recurred this many years later  2) I agree with radiology the most likely explanation for the new finding is rounded atx which can certainly occur as a result of previous pleural scarring frrom surgery  3) PET scan may not be much value at all here distinguishing inflammatory from neoplastic process and is just as likely as to confuse [place] and lead to [facility] surgery at age 21 with attendant risks involved  4) risk of neoplasm is low but not zero so if we decide to follow need to keep up with regular serial f/u > placed in tickle file for this purpose  5) Discussed in detail all the  indications, usual  risks and alternatives  relative to the benefits with patient who agrees to proceed with  F/u CT in 6 month intervals.  See instructions for specific recommendations which were reviewed directly with the patient who was given a copy with highlighter outlining the key components.

## 2014-12-23 NOTE — Progress Notes (Signed)
HPI: FU palpitations, cardiomyopathy and family history of abdominal aortic aneurysm. He had an abdominal ultrasound in July 2008 that showed no abdominal aortic aneurysm. Carotid dopplers were performed on January 01, 2008. He was found to have normal carotid arteries bilaterally. Myoview in September of 2013. Ejection fraction was 53% and the perfusion was normal. PFTs in September of 2013 showed mild obstructive defect. Echocardiogram repeated July 2015 and showed normal LV function and mildly dilated ascending aorta. Since I last saw him the patient denies any dyspnea on exertion, orthopnea, PND, pedal edema, palpitations, syncope or chest pain.   Current Outpatient Prescriptions  Medication Sig Dispense Refill  . aspirin 81 MG EC tablet Take 81 mg by mouth daily.      Marland Kitchen atorvastatin (LIPITOR) 40 MG tablet Take 1 tablet (40 mg total) by mouth daily at 6 PM. 30 tablet 11  . Calcium Carbonate-Vitamin D (CALCIUM 600/VITAMIN D) 600-400 MG-UNIT per tablet Take 1 tablet by mouth 2 (two) times daily.      Marland Kitchen co-enzyme Q-10 30 MG capsule Take 30 mg by mouth 2 (two) times daily.      . fish oil-omega-3 fatty acids 1000 MG capsule Take 1 g by mouth 2 (two) times daily. Nordic naturals omega 3 TID    . fluticasone (FLONASE) 50 MCG/ACT nasal spray USE 2 SPRAYS IN EACH NOSTRIL DAILY.  "OV NEEDED FOR ADDITIONAL REFILLS" 16 g 11  . metoprolol succinate (TOPROL-XL) 25 MG 24 hr tablet Take 0.5 tablets (12.5 mg total) by mouth daily. 30 tablet 11  . Multiple Vitamins-Minerals (VISION-VITE PRESERVE PO) Take by mouth. 2 per day    . NON FORMULARY HYDROCTISONE BUT 0.1% CREAM 45GM PRN    . Polyethyl Glycol-Propyl Glycol (SYSTANE OP) Apply to eye as needed. And systane gel drops at night    . TURMERIC PO Take 2,000 mg by mouth daily.     No current facility-administered medications for this visit.     Past Medical History  Diagnosis Date  . RBBB (right bundle branch block)   . Hyperlipidemia   . Mitral  valve prolapse     irregular heart beat  . Palpitations   . Pure hypercholesterolemia   . Dilated aortic root     Hx of mild  . Benign tumor of lung   . BPH (benign prostatic hyperplasia)   . Cardiomyopathy   . Cataract     Past Surgical History  Procedure Laterality Date  . Excision on neck      Excision of 2 cm soft tissue mass of left neck  . Resection of lung      Hx of, right lung secondary to benign tumor  . Testicular hernia    . Shoulder arthroscopy      Lt  . Cosmetic surgery    . Vasectomy    . Eye surgery      History   Social History  . Marital Status: Married    Spouse Name: N/A  . Number of Children: N/A  . Years of Education: N/A   Occupational History  . [place] History Main Topics  . Smoking status: Former Research scientist (life sciences)  . Smokeless tobacco: Never Used     Comment: quit 80's  . Alcohol Use: No  . Drug Use: No  . Sexual Activity: No   Other Topics Concern  . Not on file   Social History Narrative   Exercise 20-30 min walks. Patient is married. Education:  College.     ROS: knee arthralgias but no fevers or chills, productive cough, hemoptysis, dysphasia, odynophagia, melena, hematochezia, dysuria, hematuria, rash, seizure activity, orthopnea, PND, pedal edema, claudication. Remaining systems are negative.  Physical Exam: Well-developed well-nourished in no acute distress.  Skin is warm and dry.  HEENT is normal.  Neck is supple.  Chest is clear to auscultation with normal expansion.  Cardiovascular exam is regular rate and rhythm.  Abdominal exam nontender or distended. No masses palpated. Extremities show no edema. neuro grossly intact  ECG sinus rhythm, right bundle branch block.

## 2014-12-25 ENCOUNTER — Encounter: Payer: Self-pay | Admitting: Cardiology

## 2014-12-25 ENCOUNTER — Ambulatory Visit (INDEPENDENT_AMBULATORY_CARE_PROVIDER_SITE_OTHER): Payer: Medicare HMO | Admitting: Cardiology

## 2014-12-25 VITALS — BP 110/68 | HR 71 | Ht 73.0 in | Wt 177.9 lb

## 2014-12-25 DIAGNOSIS — R002 Palpitations: Secondary | ICD-10-CM

## 2014-12-25 DIAGNOSIS — I712 Thoracic aortic aneurysm, without rupture, unspecified: Secondary | ICD-10-CM

## 2014-12-25 DIAGNOSIS — E7800 Pure hypercholesterolemia, unspecified: Secondary | ICD-10-CM

## 2014-12-25 DIAGNOSIS — I429 Cardiomyopathy, unspecified: Secondary | ICD-10-CM | POA: Diagnosis not present

## 2014-12-25 NOTE — Assessment & Plan Note (Signed)
Continue statin. Lipids and liver monitored by primary care. 

## 2014-12-25 NOTE — Assessment & Plan Note (Signed)
Continue beta blocker. 

## 2014-12-25 NOTE — Assessment & Plan Note (Signed)
Mildly dilated most recent echocardiogram. Will not pursue further imaging at this point.

## 2014-12-25 NOTE — Assessment & Plan Note (Signed)
Improved on most recent echocardiogram.

## 2014-12-25 NOTE — Patient Instructions (Signed)
Your physician wants you to follow-up in: ONE YEAR WITH DR CRENSHAW You will receive a reminder letter in the mail two months in advance. If you don't receive a letter, please call our office to schedule the follow-up appointment.  

## 2015-01-26 ENCOUNTER — Other Ambulatory Visit: Payer: Self-pay | Admitting: Orthopaedic Surgery

## 2015-01-26 DIAGNOSIS — M25561 Pain in right knee: Secondary | ICD-10-CM

## 2015-02-01 ENCOUNTER — Ambulatory Visit
Admission: RE | Admit: 2015-02-01 | Discharge: 2015-02-01 | Disposition: A | Discharge status: Home / Self Care | Payer: Medicare HMO | Source: Ambulatory Visit | Attending: Orthopaedic Surgery | Admitting: Orthopaedic Surgery

## 2015-02-01 DIAGNOSIS — M25561 Pain in right knee: Secondary | ICD-10-CM

## 2015-03-08 DIAGNOSIS — L820 Inflamed seborrheic keratosis: Secondary | ICD-10-CM | POA: Diagnosis not present

## 2015-03-08 DIAGNOSIS — L219 Seborrheic dermatitis, unspecified: Secondary | ICD-10-CM | POA: Diagnosis not present

## 2015-03-08 DIAGNOSIS — Z85828 Personal history of other malignant neoplasm of skin: Secondary | ICD-10-CM | POA: Diagnosis not present

## 2015-03-08 DIAGNOSIS — L570 Actinic keratosis: Secondary | ICD-10-CM | POA: Diagnosis not present

## 2015-03-08 DIAGNOSIS — X32XXXD Exposure to sunlight, subsequent encounter: Secondary | ICD-10-CM | POA: Diagnosis not present

## 2015-03-08 DIAGNOSIS — Z08 Encounter for follow-up examination after completed treatment for malignant neoplasm: Secondary | ICD-10-CM | POA: Diagnosis not present

## 2015-03-09 ENCOUNTER — Encounter: Payer: Self-pay | Admitting: Emergency Medicine

## 2015-03-11 ENCOUNTER — Other Ambulatory Visit: Payer: Self-pay | Admitting: Internal Medicine

## 2015-03-11 DIAGNOSIS — R9389 Abnormal findings on diagnostic imaging of other specified body structures: Secondary | ICD-10-CM

## 2015-04-05 ENCOUNTER — Ambulatory Visit (INDEPENDENT_AMBULATORY_CARE_PROVIDER_SITE_OTHER)
Admission: RE | Admit: 2015-04-05 | Discharge: 2015-04-05 | Disposition: A | Discharge status: Home / Self Care | Payer: Medicare HMO | Source: Ambulatory Visit | Attending: Internal Medicine | Admitting: Internal Medicine

## 2015-04-05 DIAGNOSIS — R938 Abnormal findings on diagnostic imaging of other specified body structures: Secondary | ICD-10-CM

## 2015-04-05 DIAGNOSIS — R918 Other nonspecific abnormal finding of lung field: Secondary | ICD-10-CM | POA: Diagnosis not present

## 2015-04-05 DIAGNOSIS — R9389 Abnormal findings on diagnostic imaging of other specified body structures: Secondary | ICD-10-CM

## 2015-04-06 ENCOUNTER — Inpatient Hospital Stay: Admission: RE | Admit: 2015-04-06 | Payer: Medicare HMO | Source: Ambulatory Visit

## 2015-07-19 ENCOUNTER — Telehealth: Payer: Self-pay | Admitting: Family Medicine

## 2015-07-19 NOTE — Telephone Encounter (Signed)
PATIENT RETURNED CALL AND WE RESCHEDULED HIS PHYSICAL FROM 08/05/15 TO 10/21/15 DUE TO IT BEING TOO EARLY FOR TIME.

## 2015-07-19 NOTE — Telephone Encounter (Signed)
Left a message for patient to return call.  His last CPE was on 09-29-14.  Need to find out if his insurance will pay for him to have it on 08-05-15.  If not we will need to reschedule it.

## 2015-08-05 ENCOUNTER — Encounter: Payer: Medicare HMO | Admitting: Emergency Medicine

## 2015-08-16 DIAGNOSIS — H25013 Cortical age-related cataract, bilateral: Secondary | ICD-10-CM | POA: Diagnosis not present

## 2015-08-16 DIAGNOSIS — H2513 Age-related nuclear cataract, bilateral: Secondary | ICD-10-CM | POA: Diagnosis not present

## 2015-08-16 DIAGNOSIS — H01025 Squamous blepharitis left lower eyelid: Secondary | ICD-10-CM | POA: Diagnosis not present

## 2015-08-16 DIAGNOSIS — H01021 Squamous blepharitis right upper eyelid: Secondary | ICD-10-CM | POA: Diagnosis not present

## 2015-08-16 DIAGNOSIS — H01022 Squamous blepharitis right lower eyelid: Secondary | ICD-10-CM | POA: Diagnosis not present

## 2015-08-16 DIAGNOSIS — H01024 Squamous blepharitis left upper eyelid: Secondary | ICD-10-CM | POA: Diagnosis not present

## 2015-10-21 ENCOUNTER — Ambulatory Visit (INDEPENDENT_AMBULATORY_CARE_PROVIDER_SITE_OTHER): Payer: Medicare HMO | Admitting: Emergency Medicine

## 2015-10-21 ENCOUNTER — Encounter: Payer: Self-pay | Admitting: Emergency Medicine

## 2015-10-21 VITALS — BP 110/64 | HR 67 | Temp 97.9°F | Resp 16 | Ht 71.5 in | Wt 176.4 lb

## 2015-10-21 DIAGNOSIS — E785 Hyperlipidemia, unspecified: Secondary | ICD-10-CM | POA: Diagnosis not present

## 2015-10-21 DIAGNOSIS — J301 Allergic rhinitis due to pollen: Secondary | ICD-10-CM

## 2015-10-21 DIAGNOSIS — R938 Abnormal findings on diagnostic imaging of other specified body structures: Secondary | ICD-10-CM

## 2015-10-21 DIAGNOSIS — R9389 Abnormal findings on diagnostic imaging of other specified body structures: Secondary | ICD-10-CM

## 2015-10-21 DIAGNOSIS — Z125 Encounter for screening for malignant neoplasm of prostate: Secondary | ICD-10-CM | POA: Diagnosis not present

## 2015-10-21 DIAGNOSIS — I10 Essential (primary) hypertension: Secondary | ICD-10-CM

## 2015-10-21 DIAGNOSIS — Z0000 Encounter for general adult medical examination without abnormal findings: Secondary | ICD-10-CM | POA: Diagnosis not present

## 2015-10-21 LAB — COMPREHENSIVE METABOLIC PANEL
ALK PHOS: 74 U/L (ref 40–115)
ALT: 13 U/L (ref 9–46)
AST: 18 U/L (ref 10–35)
Albumin: 4.3 g/dL (ref 3.6–5.1)
BUN: 17 mg/dL (ref 7–25)
CHLORIDE: 100 mmol/L (ref 98–110)
CO2: 28 mmol/L (ref 20–31)
CREATININE: 0.87 mg/dL (ref 0.70–1.11)
Calcium: 9.2 mg/dL (ref 8.6–10.3)
Glucose, Bld: 79 mg/dL (ref 65–99)
Potassium: 4.4 mmol/L (ref 3.5–5.3)
SODIUM: 140 mmol/L (ref 135–146)
TOTAL PROTEIN: 6.6 g/dL (ref 6.1–8.1)
Total Bilirubin: 1.1 mg/dL (ref 0.2–1.2)

## 2015-10-21 LAB — CBC WITH DIFFERENTIAL/PLATELET
BASOS PCT: 1 %
Basophils Absolute: 42 cells/uL (ref 0–200)
EOS PCT: 3 %
Eosinophils Absolute: 126 cells/uL (ref 15–500)
HCT: 45 % (ref 38.5–50.0)
Hemoglobin: 15.3 g/dL (ref 13.2–17.1)
Lymphocytes Relative: 23 %
Lymphs Abs: 966 cells/uL (ref 850–3900)
MCH: 31 pg (ref 27.0–33.0)
MCHC: 34 g/dL (ref 32.0–36.0)
MCV: 91.3 fL (ref 80.0–100.0)
MONOS PCT: 13 %
MPV: 10.4 fL (ref 7.5–12.5)
Monocytes Absolute: 546 cells/uL (ref 200–950)
NEUTROS ABS: 2520 {cells}/uL (ref 1500–7800)
Neutrophils Relative %: 60 %
PLATELETS: 183 10*3/uL (ref 140–400)
RBC: 4.93 MIL/uL (ref 4.20–5.80)
RDW: 13.1 % (ref 11.0–15.0)
WBC: 4.2 10*3/uL (ref 3.8–10.8)

## 2015-10-21 LAB — POC MICROSCOPIC URINALYSIS (UMFC): Mucus: ABSENT

## 2015-10-21 LAB — POCT URINALYSIS DIP (MANUAL ENTRY)
Bilirubin, UA: NEGATIVE
Blood, UA: NEGATIVE
Glucose, UA: NEGATIVE
Ketones, POC UA: NEGATIVE
LEUKOCYTES UA: NEGATIVE
NITRITE UA: NEGATIVE
PH UA: 7.5
PROTEIN UA: NEGATIVE
Spec Grav, UA: 1.015
Urobilinogen, UA: 0.2

## 2015-10-21 MED ORDER — METOPROLOL SUCCINATE ER 25 MG PO TB24: 12.5000 mg | ORAL_TABLET | Freq: Every day | ORAL | Status: DC

## 2015-10-21 MED ORDER — ATORVASTATIN CALCIUM 40 MG PO TABS: 40.0000 mg | ORAL_TABLET | Freq: Every day | ORAL | Status: DC

## 2015-10-21 MED ORDER — FLUTICASONE PROPIONATE 50 MCG/ACT NA SUSP: NASAL | Status: DC

## 2015-10-21 NOTE — Patient Instructions (Addendum)
   IF you received an x-ray today, you will receive an invoice from [facility]. Please contact [facility] at [phone] with questions or concerns regarding your invoice.   IF you received labwork today, you will receive an invoice from [facility]. Please contact [facility] at [phone] with questions or concerns regarding your invoice.   Our billing staff will not be able to assist you with questions regarding bills from these companies.  You will be contacted with the lab results as soon as they are available. The fastest way to get your results is to activate your My Chart account. Instructions are located on the last page of this paperwork. If you have not heard from us regarding the results in 2 weeks, please contact this office.    Health Maintenance, Male A healthy lifestyle and preventative care can promote health and wellness.  Maintain regular health, dental, and eye exams.  Eat a healthy diet. Foods like vegetables, fruits, whole grains, low-fat dairy products, and lean protein foods contain the nutrients you need and are low in calories. Decrease your intake of foods high in solid fats, added sugars, and salt. Get information about a proper diet from your health care provider, if necessary.  Regular physical exercise is one of the most important things you can do for your health. Most adults should get at least 150 minutes of moderate-intensity exercise (any activity that increases your heart rate and causes you to sweat) each week. In addition, most adults need muscle-strengthening exercises on 2 or more days a week.   Maintain a healthy weight. The body mass index (BMI) is a screening tool to identify possible weight problems. It provides an estimate of body fat based on height and weight. Your health care provider can find your BMI and can help you achieve or maintain a healthy weight. For males 20 years and older:  A BMI  below 18.5 is considered underweight.  A BMI of 18.5 to 24.9 is normal.  A BMI of 25 to 29.9 is considered overweight.  A BMI of 30 and above is considered obese.  Maintain normal blood lipids and cholesterol by exercising and minimizing your intake of saturated fat. Eat a balanced diet with plenty of fruits and vegetables. Blood tests for lipids and cholesterol should begin at age 20 and be repeated every 5 years. If your lipid or cholesterol levels are high, you are over age 50, or you are at high risk for heart disease, you may need your cholesterol levels checked more frequently.Ongoing high lipid and cholesterol levels should be treated with medicines if diet and exercise are not working.  If you smoke, find out from your health care provider how to quit. If you do not use tobacco, do not start.  Lung cancer screening is recommended for adults aged 55-80 years who are at high risk for developing lung cancer because of a history of smoking. A yearly low-dose CT scan of the lungs is recommended for people who have at least a 30-pack-year history of smoking and are current smokers or have quit within the past 15 years. A pack year of smoking is smoking an average of 1 pack of cigarettes a day for 1 year (for example, a 30-pack-year history of smoking could mean smoking 1 pack a day for 30 years or 2 packs a day for 15 years). Yearly screening should continue until the smoker has stopped smoking for at least 15 years. Yearly screening should be stopped   for people who develop a health problem that would prevent them from having lung cancer treatment.  If you choose to drink alcohol, do not have more than 2 drinks per day. One drink is considered to be 12 oz (360 mL) of beer, 5 oz (150 mL) of wine, or 1.5 oz (45 mL) of liquor.  Avoid the use of street drugs. Do not share needles with anyone. Ask for help if you need support or instructions about stopping the use of drugs.  High blood pressure  causes heart disease and increases the risk of stroke. High blood pressure is more likely to develop in:  People who have blood pressure in the end of the normal range (100-139/85-89 mm Hg).  People who are overweight or obese.  People who are African American.  If you are 18-39 years of age, have your blood pressure checked every 3-5 years. If you are 40 years of age or older, have your blood pressure checked every year. You should have your blood pressure measured twice--once when you are at a hospital or clinic, and once when you are not at a hospital or clinic. Record the average of the two measurements. To check your blood pressure when you are not at a hospital or clinic, you can use:  An automated blood pressure machine at a pharmacy.  A home blood pressure monitor.  If you are 45-79 years old, ask your health care provider if you should take aspirin to prevent heart disease.  Diabetes screening involves taking a blood sample to check your fasting blood sugar level. This should be done once every 3 years after age 45 if you are at a normal weight and without risk factors for diabetes. Testing should be considered at a younger age or be carried out more frequently if you are overweight and have at least 1 risk factor for diabetes.  Colorectal cancer can be detected and often prevented. Most routine colorectal cancer screening begins at the age of 50 and continues through age 75. However, your health care provider may recommend screening at an earlier age if you have risk factors for colon cancer. On a yearly basis, your health care provider may provide home test kits to check for hidden blood in the stool. A small camera at the end of a tube may be used to directly examine the colon (sigmoidoscopy or colonoscopy) to detect the earliest forms of colorectal cancer. Talk to your health care provider about this at age 50 when routine screening begins. A direct exam of the colon should be repeated  every 5-10 years through age 75, unless early forms of precancerous polyps or small growths are found.  People who are at an increased risk for hepatitis B should be screened for this virus. You are considered at high risk for hepatitis B if:  You were born in a country where hepatitis B occurs often. Talk with your health care provider about which countries are considered high risk.  Your parents were born in a high-risk country and you have not received a shot to protect against hepatitis B (hepatitis B vaccine).  You have HIV or AIDS.  You use needles to inject street drugs.  You live with, or have sex with, someone who has hepatitis B.  You are a man who has sex with other men (MSM).  You get hemodialysis treatment.  You take certain medicines for conditions like cancer, organ transplantation, and autoimmune conditions.  Hepatitis C blood testing is recommended for   all people born from 1945 through 1965 and any individual with known risk factors for hepatitis C.  Healthy men should no longer receive prostate-specific antigen (PSA) blood tests as part of routine cancer screening. Talk to your health care provider about prostate cancer screening.  Testicular cancer screening is not recommended for adolescents or adult males who have no symptoms. Screening includes self-exam, a health care provider exam, and other screening tests. Consult with your health care provider about any symptoms you have or any concerns you have about testicular cancer.  Practice safe sex. Use condoms and avoid high-risk sexual practices to reduce the spread of sexually transmitted infections (STIs).  You should be screened for STIs, including gonorrhea and chlamydia if:  You are sexually active and are younger than 24 years.  You are older than 24 years, and your health care provider tells you that you are at risk for this type of infection.  Your sexual activity has changed since you were last screened,  and you are at an increased risk for chlamydia or gonorrhea. Ask your health care provider if you are at risk.  If you are at risk of being infected with HIV, it is recommended that you take a prescription medicine daily to prevent HIV infection. This is called pre-exposure prophylaxis (PrEP). You are considered at risk if:  You are a man who has sex with other men (MSM).  You are a heterosexual man who is sexually active with multiple partners.  You take drugs by injection.  You are sexually active with a partner who has HIV.  Talk with your health care provider about whether you are at high risk of being infected with HIV. If you choose to begin PrEP, you should first be tested for HIV. You should then be tested every 3 months for as long as you are taking PrEP.  Use sunscreen. Apply sunscreen liberally and repeatedly throughout the day. You should seek shade when your shadow is shorter than you. Protect yourself by wearing long sleeves, pants, a wide-brimmed hat, and sunglasses year round whenever you are outdoors.  Tell your health care provider of new moles or changes in moles, especially if there is a change in shape or color. Also, tell your health care provider if a mole is larger than the size of a pencil eraser.  A one-time screening for abdominal aortic aneurysm (AAA) and surgical repair of large AAAs by ultrasound is recommended for men aged 65-75 years who are current or former smokers.  Stay current with your vaccines (immunizations).   This information is not intended to replace advice given to you by your health care provider. Make sure you discuss any questions you have with your health care provider.   Document Released: 11/18/2007 Document Revised: 06/12/2014 Document Reviewed: 10/17/2010 Elsevier Interactive Patient Education 2016 [employer]  

## 2015-10-21 NOTE — Progress Notes (Addendum)
By signing my name below, I, Mesha Guinyard, attest that this documentation has been prepared under the direction and in the presence of Arlyss Queen, MD.  Electronically Signed: Verlee Monte, Medical Scribe. 10/21/2015. 1:12 PM.  Chief Complaint:  Chief Complaint  Patient presents with  . Annual Exam  . Medication Refill    all Rx    HPI: [name] [name] is a 82 y.o. male with a PMHx of HLD, and heart complications who reports to [facility] today for his annual physical. Pt states he's doing good. Pt notes some pain in his feet where his socks are. Pt states he has dry eyes and goes to see an ophthalmologist annually. Pt reports occationaly getting some drainage in the back of his throat during allergy season. 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 Pt notes he might need to get his ear canal cleaned. Pt reoprts regularly seeing his cardiologist. Pt mentions concern on the status of his hernia. Pt reports his feet are flat like his dad. Pt denies chest pain, SOB, coughing, or pain in his calves.  Pt reports his mom died at 77 with pancreatic CA, and his dad died at 20.  Past Medical History  Diagnosis Date  . RBBB (right bundle branch block)   . Hyperlipidemia   . Mitral valve prolapse     irregular heart beat  . Palpitations   . Pure hypercholesterolemia   . Dilated aortic root (HCC)     Hx of mild  . Benign tumor of lung   . BPH (benign prostatic hyperplasia)   . Cardiomyopathy ([place])   . Cataract    Past Surgical History  Procedure Laterality Date  . Excision on neck      Excision of 2 cm soft tissue mass of left neck  . Resection of lung      Hx of, right lung secondary to benign tumor  . Testicular hernia    . Shoulder arthroscopy      Lt  . Cosmetic surgery    . Vasectomy    . Eye surgery    . Hernia repair     Social History   Social History  . Marital Status: Married    Spouse Name: N/A  . Number of Children: N/A  . Years of Education: N/A    Occupational History  . [place] History Main Topics  . Smoking status: Former Research scientist (life sciences)  . Smokeless tobacco: Never Used     Comment: quit 80's  . Alcohol Use: No  . Drug Use: No  . Sexual Activity: No   Other Topics Concern  . None   Social History Narrative   Exercise 20-30 min walks. Patient is married. Education: [employer].    Family History  Problem Relation Age of Onset  . Aortic aneurysm Father   . Heart disease Father   . Hypertension Father   . Hyperlipidemia Father   . Hyperlipidemia    . Colon cancer Sister 65  . Cancer Sister   . Esophageal cancer Neg Hx   . Stomach cancer Neg Hx   . Rectal cancer Neg Hx   . Cancer Sister    No Known Allergies Prior to Admission medications   Medication Sig Start Date End Date Taking? Authorizing Provider  aspirin 81 MG EC tablet Take 81 mg by mouth daily.     Yes Historical Provider, MD  atorvastatin (LIPITOR) 40 MG tablet Take 1 tablet (40 mg total) by mouth  daily at 6 PM. 09/29/14  Yes [name], MD  Calcium Carbonate-Vitamin D (CALCIUM 600/VITAMIN D) 600-400 MG-UNIT per tablet Take 1 tablet by mouth 2 (two) times daily.     Yes Historical Provider, MD  carboxymethylcellulose (RETAINE CMC) 0.5 % SOLN 1 drop 3 (three) times daily as needed.   Yes Historical Provider, MD  co-enzyme Q-10 30 MG capsule Take 30 mg by mouth 2 (two) times daily.     Yes Historical Provider, MD  fish oil-omega-3 fatty acids 1000 MG capsule Take 1 g by mouth 2 (two) times daily. [employer] omega 3 TID   Yes Historical Provider, MD  fluticasone (FLONASE) 50 MCG/ACT nasal spray USE 2 SPRAYS IN EACH NOSTRIL DAILY.  "OV NEEDED FOR ADDITIONAL REFILLS" 09/29/14  Yes [name], MD  metoprolol succinate (TOPROL-XL) 25 MG 24 hr tablet Take 0.5 tablets (12.5 mg total) by mouth daily. 09/29/14  Yes [name], MD  Multiple Vitamins-Minerals (VISION-VITE PRESERVE PO) Take by mouth. 2 per day   Yes Historical Provider, MD  NON FORMULARY  HYDROCTISONE BUT 0.1% CREAM 45GM PRN   Yes Historical Provider, MD  TURMERIC PO Take 2,000 mg by mouth daily.   Yes Historical Provider, MD  Polyethyl Glycol-Propyl Glycol (SYSTANE OP) Apply to eye as needed. Reported on 10/21/2015    Historical Provider, MD     ROS: The patient denies fevers, chills, night sweats, unintentional weight loss, chest pain, palpitations, wheezing, dyspnea on exertion, nausea, vomiting, abdominal pain, dysuria, hematuria, melena, numbness, weakness, or tingling.  All other systems have been reviewed and were otherwise negative with the exception of those mentioned in the HPI and as above.    PHYSICAL EXAM: Filed Vitals:   10/21/15 1303  BP: 110/64  Pulse: 67  Temp: 97.9 F (36.6 C)  Resp: 16   Body mass index is 24.26 kg/(m^2).  BP reading: 110/70  General: Alert, no acute distress HEENT:  Normocephalic, atraumatic, oropharynx patent. Eye: Juliette M[facility] Cardiovascular:  Regular rate and grade 2 systolic murmur left sternal boarder. No rubs murmurs or gallops.  No Carotid bruits, radial pulse intact. No pedal edema.  Respiratory: Clear to auscultation bilaterally.  No wheezes, rales, or rhonchi.  No cyanosis, no use of accessory musculature GU: Prostate is nl. Abdominal: No organomegaly, abdomen is soft, non-tender, and no extension, positive bowel sounds.  No masses. Recurrent inguinal hernia.  Musculoskeletal: Gait intact. No edema, tenderness Feet: Diminished pulses. Nl capillary filling. He's very flat footed. Skin: No rashes. Multiple areas of sun damaged skin. Neurologic: Facial musculature symmetric. Psychiatric: Patient acts appropriately throughout our interaction. Lymphatic: No cervical or submandibular lymphadenopathy Meds ordered this encounter  Medications  . carboxymethylcellulose (RETAINE CMC) 0.5 % SOLN    Sig: 1 drop 3 (three) times daily as needed.  . Probiotic Product (PROBIOTIC DAILY PO)    Sig: Take by mouth.  . metoprolol  succinate (TOPROL-XL) 25 MG 24 hr tablet    Sig: Take 0.5 tablets (12.5 mg total) by mouth daily.    Dispense:  30 tablet    Refill:  11  . fluticasone (FLONASE) 50 MCG/ACT nasal spray    Sig: USE 2 SPRAYS IN EACH NOSTRIL DAILY.    Dispense:  16 g    Refill:  11  . atorvastatin (LIPITOR) 40 MG tablet    Sig: Take 1 tablet (40 mg total) by mouth daily at 6 PM.    Dispense:  30 tablet    Refill:  11  LABS:  Results for orders placed or performed in visit on 10/21/15  POCT Microscopic Urinalysis ([facility])  Result Value Ref Range   WBC,UR,HPF,POC None None WBC/hpf   RBC,UR,HPF,POC None None RBC/hpf   Bacteria None None, Too numerous to count   Mucus Absent Absent   Epithelial Cells, UR Per Microscopy None None, Too numerous to count cells/hpf  POCT urinalysis dipstick  Result Value Ref Range   Color, UA yellow yellow   Clarity, UA clear clear   Glucose, UA negative negative   Bilirubin, UA negative negative   Ketones, POC UA negative negative   Spec Grav, UA 1.015    Blood, UA negative negative   pH, UA 7.5    Protein Ur, POC negative negative   Urobilinogen, UA 0.2    Nitrite, UA Negative Negative   Leukocytes, UA Negative Negative   EKG/XRAY:   Primary read interpreted by Dr. Everlene Farrier at [facility].   ASSESSMENT/PLAN: This patient maintains a very healthy lifestyle. He is doing very well continues to be active he has regular follow-ups with his cardiologist I did not make a change his medications today and refilled his chronic meds.I personally performed the services described in this documentation, which was scribed in my presence. The recorded information has been reviewed and is accurate.I did make him a referral back to Dr. Melvyn Novas follow-up on his abnormal chest x-ray.   Gross sideeffects, risk and benefits, and alternatives of medications d/w patient. Patient is aware that all medications have potential sideeffects and we are unable to predict every sideeffect or drug-drug  interaction that may occur.  Arlyss Queen MD 10/21/2015 1:12 PM

## 2015-10-22 LAB — PSA, MEDICARE: PSA: 0.47 ng/mL (ref <=4.00)

## 2015-10-26 ENCOUNTER — Encounter: Payer: Self-pay | Admitting: Internal Medicine

## 2015-10-26 ENCOUNTER — Ambulatory Visit (INDEPENDENT_AMBULATORY_CARE_PROVIDER_SITE_OTHER)
Admission: RE | Admit: 2015-10-26 | Discharge: 2015-10-26 | Disposition: A | Discharge status: Home / Self Care | Payer: Medicare HMO | Source: Ambulatory Visit | Attending: Internal Medicine | Admitting: Internal Medicine

## 2015-10-26 ENCOUNTER — Ambulatory Visit (INDEPENDENT_AMBULATORY_CARE_PROVIDER_SITE_OTHER): Payer: Medicare HMO | Admitting: Internal Medicine

## 2015-10-26 VITALS — BP 118/68 | HR 65 | Ht 71.5 in | Wt 178.0 lb

## 2015-10-26 DIAGNOSIS — R938 Abnormal findings on diagnostic imaging of other specified body structures: Secondary | ICD-10-CM

## 2015-10-26 DIAGNOSIS — R05 Cough: Secondary | ICD-10-CM | POA: Diagnosis not present

## 2015-10-26 DIAGNOSIS — R9389 Abnormal findings on diagnostic imaging of other specified body structures: Secondary | ICD-10-CM

## 2015-10-26 NOTE — Progress Notes (Signed)
Quick Note:  Spoke with pt and notified of results per Dr. Wert. Pt verbalized understanding and denied any questions.  ______ 

## 2015-10-26 NOTE — Patient Instructions (Signed)
Please remember to go to the   x-ray department downstairs for your tests - we will call you with the results when they are available.   If you are satisfied with your treatment plan,  let your doctor know and he/she can either refill your medications or you can return here when your prescription runs out.     If in any way you are not 100% satisfied,  please tell us.  If 100% better, tell your friends!  Pulmonary follow up is as needed        

## 2015-10-26 NOTE — Progress Notes (Signed)
Subjective:    Patient ID: [name] [name], male    DOB: [birth year]    MRN: [id number]  HPI  84 yowm quit smoking 1985 eval by [facility] >  R lung "collapse/blockage" >  R lung surgery around 1985 (? [facility]ka) dx "benign tumor"  With no chemo/RT, followed by [facility] for  A few years then no further specialty f/u but CT chest 10/05/14 suggested new changes in RLL so referred to pulmonary clinic 10/26/14 by Dr Perfecto Kingdom.    10/26/2014 f/u ov/Jamirah [name] re:  Chief Complaint  Patient presents with  . Pulmonary Consult    Referred by Dr Everlene Farrier. Pt states that mass on lung was noticied 2 years ago and was stable until now. Pt stated that he was told mass has increased. Denies any breathing problems during visit   Not limited by breathing from desired activities  And quite active but not aerobic rec CT chest 04/05/15 all benign changes   10/26/2015  f/u ov/[name] [name] re: abn cxr  Chief Complaint  Patient presents with  . Follow-up    1 year rov.  pt c/o sinus congestion, runny nose, nonprod cough Xfew days.    Not limited by breathing from desired activities    Other than acute uri symptoms x sev days prior to OV  Has been doing fine  No obvious day to day or daytime variability or assoc chronic cough or cp or chest tightness, subjective wheeze or overt sinus or hb symptoms. No unusual exp hx or h/o childhood pna/ asthma or knowledge of premature birth.  Sleeping ok without nocturnal  or early am exacerbation  of respiratory  c/o's or need for noct saba. Also denies any obvious fluctuation of symptoms with weather or environmental changes or other aggravating or alleviating factors except as outlined above   Current Medications, Allergies, Complete Past Medical History, Past Surgical History, Family History, and Social History were reviewed in [employer] record.  ROS  The following are not active complaints unless bolded sore throat, dysphagia, dental problems, itching,  sneezing,  nasal congestion/watery discharge only  or excess/ purulent secretions, ear ache,   fever, chills, sweats, unintended wt loss, classically pleuritic or exertional cp, hemoptysis,  orthopnea pnd or leg swelling, presyncope, palpitations, abdominal pain, anorexia, nausea, vomiting, diarrhea  or change in bowel or bladder habits, change in stools or urine, dysuria,hematuria,  rash, arthralgias, visual complaints, headache, numbness, weakness or ataxia or problems with walking or coordination,  change in mood/affect or memory.                        Objective:   Physical Exam  amb wm nad   10/26/2015        178  10/26/14 177 lb (80.287 kg)  09/29/14 177 lb 9.6 oz (80.559 kg)  12/18/13 175 lb (79.379 kg)    Vital signs reviewed    HEENT: nl dentition,  and orophanx. Nl external ear canals without cough reflex - moderate bilateral non-specific turbinate edema     NECK :  without JVD/Nodes/TM/ nl carotid upstrokes bilaterally   LUNGS: no acc muscle use,     CV:  RRR  no s3 or murmur or increase in P2, no edema   ABD:  soft and nontender with nl excursion in the supine position. No bruits or organomegaly, bowel sounds nl  MS:  warm without deformities, calf tenderness, cyanosis or clubbing  SKIN: warm and dry without  lesions    NEURO:  alert, approp, no deficits           CXR PA and Lateral:   10/26/2015 :    I personally reviewed images and agree with radiology impression as follows:   Stable postoperative changes on the right. No acute findings.         Assessment & Plan:

## 2015-10-29 NOTE — Assessment & Plan Note (Signed)
CT chest 04/05/2015 1. Interval near complete resolution of the rounded mass like lesion in the lateral RIGHT lower lobe consistent with resolution of rounded atelectasis versus rounded pneumonia. 2. Persistent mild interstitial banding and bronchiectasis in the RIGHT lower lobe at site above inflammation / infection. 3. Stable small scattered pulmonary nodules. 4. Stable postsurgical change in the RIGHT upper lobe >> f/u cxr 10/26/2015 > ok   I had an extended final summary discussion with the patient reviewing all relevant studies completed to date and  lasting 10 minutes of a 15 minute visit on the following issues:    No evidence of nodules or ILD/ no pulmonary f/u needed   Instructions on otc's for uri's reviewed  Pulmonary f/u is prn

## 2015-11-05 ENCOUNTER — Ambulatory Visit (INDEPENDENT_AMBULATORY_CARE_PROVIDER_SITE_OTHER): Payer: Medicare HMO

## 2015-11-05 ENCOUNTER — Ambulatory Visit (INDEPENDENT_AMBULATORY_CARE_PROVIDER_SITE_OTHER): Payer: Medicare HMO | Admitting: Emergency Medicine

## 2015-11-05 VITALS — BP 121/68 | HR 65 | Temp 97.9°F | Resp 18 | Ht 72.0 in | Wt 179.8 lb

## 2015-11-05 DIAGNOSIS — R05 Cough: Secondary | ICD-10-CM

## 2015-11-05 DIAGNOSIS — J189 Pneumonia, unspecified organism: Secondary | ICD-10-CM | POA: Diagnosis not present

## 2015-11-05 DIAGNOSIS — R509 Fever, unspecified: Secondary | ICD-10-CM

## 2015-11-05 DIAGNOSIS — R059 Cough, unspecified: Secondary | ICD-10-CM

## 2015-11-05 LAB — POCT CBC
Granulocyte percent: 76.3 %G (ref 37–80)
HCT, POC: 41.3 % — AB (ref 43.5–53.7)
HEMOGLOBIN: 14.3 g/dL (ref 14.1–18.1)
LYMPH, POC: 1.1 (ref 0.6–3.4)
MCH: 30.8 pg (ref 27–31.2)
MCHC: 34.5 g/dL (ref 31.8–35.4)
MCV: 89.2 fL (ref 80–97)
MID (cbc): 0.8 (ref 0–0.9)
MPV: 6.6 fL (ref 0–99.8)
POC Granulocyte: 6.1 (ref 2–6.9)
POC LYMPH PERCENT: 13.2 %L (ref 10–50)
POC MID %: 10.5 %M (ref 0–12)
Platelet Count, POC: 266 10*3/uL (ref 142–424)
RBC: 4.63 M/uL — AB (ref 4.69–6.13)
RDW, POC: 12.4 %
WBC: 8 10*3/uL (ref 4.6–10.2)

## 2015-11-05 MED ORDER — DOXYCYCLINE HYCLATE 100 MG PO TABS: 100.0000 mg | ORAL_TABLET | Freq: Two times a day (BID) | ORAL | Status: DC

## 2015-11-05 MED ORDER — LEVOFLOXACIN 500 MG PO TABS: 500.0000 mg | ORAL_TABLET | Freq: Every day | ORAL | Status: DC

## 2015-11-05 MED ORDER — CEFTRIAXONE SODIUM 1 G IJ SOLR
1.0000 g | Freq: Once | INTRAMUSCULAR | Status: AC
  Administered 2015-11-05: 1 g via INTRAMUSCULAR

## 2015-11-05 NOTE — Patient Instructions (Addendum)
   IF you received an x-ray today, you will receive an invoice from [facility]. Please contact  Radiology at [phone] with questions or concerns regarding your invoice.   IF you received labwork today, you will receive an invoice from [facility]. Please contact [facility] at [phone] with questions or concerns regarding your invoice.   Our billing staff will not be able to assist you with questions regarding bills from these companies.  You will be contacted with the lab results as soon as they are available. The fastest way to get your results is to activate your My Chart account. Instructions are located on the last page of this paperwork. If you have not heard from us regarding the results in 2 weeks, please contact this office.      Community-Acquired Pneumonia, Adult Pneumonia is an infection of the lungs. There are different types of pneumonia. One type can develop while a person is in a hospital. A different type, called community-acquired pneumonia, develops in people who are not, or have not recently been, in the hospital or other health care facility.  CAUSES Pneumonia may be caused by bacteria, viruses, or funguses. Community-acquired pneumonia is often caused by Streptococcus pneumonia bacteria. These bacteria are often passed from one person to another by breathing in droplets from the cough or sneeze of an infected person. RISK FACTORS The condition is more likely to develop in:  People who havechronic diseases, such as chronic obstructive pulmonary disease (COPD), asthma, congestive heart failure, cystic fibrosis, diabetes, or kidney disease.  People who haveearly-stage or late-stage HIV.  People who havesickle cell disease.  People who havehad their spleen removed (splenectomy).  People who havepoor dental hygiene.  People who havemedical conditions that increase the risk of breathing in (aspirating)  secretions their own mouth and nose.   People who havea weakened immune system (immunocompromised).  People who smoke.  People whotravel to areas where pneumonia-causing germs commonly exist.  People whoare around animal habitats or animals that have pneumonia-causing germs, including birds, bats, rabbits, cats, and farm animals. SYMPTOMS Symptoms of this condition include:  Adry cough.  A wet (productive) cough.  Fever.  Sweating.  Chest pain, especially when breathing deeply or coughing.  Rapid breathing or difficulty breathing.  Shortness of breath.  Shaking chills.  Fatigue.  Muscle aches. DIAGNOSIS Your health care provider will take a medical history and perform a physical exam. You may also have other tests, including:  Imaging studies of your chest, including X-rays.  Tests to check your blood oxygen level and other blood gases.  Other tests on blood, mucus (sputum), fluid around your lungs (pleural fluid), and urine. If your pneumonia is severe, other tests may be done to identify the specific cause of your illness. TREATMENT The type of treatment that you receive depends on many factors, such as the cause of your pneumonia, the medicines you take, and other medical conditions that you have. For most adults, treatment and recovery from pneumonia may occur at home. In some cases, treatment must happen in a hospital. Treatment may include:  Antibiotic medicines, if the pneumonia was caused by bacteria.  Antiviral medicines, if the pneumonia was caused by a virus.  Medicines that are given by mouth or through an IV tube.  Oxygen.  Respiratory therapy. Although rare, treating severe pneumonia may include:  Mechanical ventilation. This is done if you are not breathing well on your own and you cannot maintain a safe blood oxygen level.    Thoracentesis. This procedureremoves fluid around one lung or both lungs to help you breathe better. HOME CARE  INSTRUCTIONS  Take over-the-counter and prescription medicines only as told by your health care provider.  Only takecough medicine if you are losing sleep. Understand that cough medicine can prevent your body's natural ability to remove mucus from your lungs.  If you were prescribed an antibiotic medicine, take it as told by your health care provider. Do not stop taking the antibiotic even if you start to feel better.  Sleep in a semi-upright position at night. Try sleeping in a reclining chair, or place a few pillows under your head.  Do not use tobacco products, including cigarettes, chewing tobacco, and e-cigarettes. If you need help quitting, ask your health care provider.  Drink enough water to keep your urine clear or pale yellow. This will help to thin out mucus secretions in your lungs. PREVENTION There are ways that you can decrease your risk of developing community-acquired pneumonia. Consider getting a pneumococcal vaccine if:  You are older than 80 years of age.  You are older than 80 years of age and are undergoing cancer treatment, have chronic lung disease, or have other medical conditions that affect your immune system. Ask your health care provider if this applies to you. There are different types and schedules of pneumococcal vaccines. Ask your health care provider which vaccination option is best for you. You may also prevent community-acquired pneumonia if you take these actions:  Get an influenza vaccine every year. Ask your health care provider which type of influenza vaccine is best for you.  Go to the dentist on a regular basis.  Wash your hands often. Use hand sanitizer if soap and water are not available. SEEK MEDICAL CARE IF:  You have a fever.  You are losing sleep because you cannot control your cough with cough medicine. SEEK IMMEDIATE MEDICAL CARE IF:  You have worsening shortness of breath.  You have increased chest pain.  Your sickness becomes  worse, especially if you are an older adult or have a weakened immune system.  You cough up blood.   This information is not intended to replace advice given to you by your health care provider. Make sure you discuss any questions you have with your health care provider.   Document Released: 05/22/2005 Document Revised: 02/10/2015 Document Reviewed: 09/16/2014 Elsevier Interactive Patient Education 2016 [employer]  

## 2015-11-05 NOTE — Progress Notes (Addendum)
Patient ID: [name] [name], male   DOB: [birth year], 82 y.o.   MRN: [id number]    By signing my name below, I, [name] [name], attest that this documentation has been prepared under the direction and in the presence of [name] Russian, MD Electronically Signed: Ladene Artist, ED Scribe 11/05/2015 at 12:27 PM  Chief Complaint:  Chief Complaint  Patient presents with  . Tick Removal    got tick bite x 4  porobably 1 month ago   . Headache  . Cough     low grade fever x 3 weeks   HPI: [name] [name] is a 82 y.o. male who reports to [facility] today complaining of persistent productive cough with thick sputum for the past 3 weeks. Pt reports associated symptoms of low grade fever with Tmax of 99.9 F that has resolved, increased SOB, chest congestion, nasal congestion at night and HA. He has tried his inhalers without significant relief. Pt denies hemoptysis. Pt reports approximately 4 tick bites 1 month ago. No known drug allergies.   Pulmonology  Pt was recently released by his pulmonologist due to improvement of his scans.   Past Medical History  Diagnosis Date  . RBBB (right bundle branch block)   . Hyperlipidemia   . Mitral valve prolapse     irregular heart beat  . Palpitations   . Pure hypercholesterolemia   . Dilated aortic root (HCC)     Hx of mild  . Benign tumor of lung   . BPH (benign prostatic hyperplasia)   . Cardiomyopathy ([place])   . Cataract    Past Surgical History  Procedure Laterality Date  . Excision on neck      Excision of 2 cm soft tissue mass of left neck  . Resection of lung      Hx of, right lung secondary to benign tumor  . Testicular hernia    . Shoulder arthroscopy      Lt  . Cosmetic surgery    . Vasectomy    . Eye surgery    . Hernia repair     Social History   Social History  . Marital Status: Married    Spouse Name: N/A  . Number of Children: N/A  . Years of Education: N/A   Occupational History  . [place] History Main Topics  .  Smoking status: Former Research scientist (life sciences)  . Smokeless tobacco: Never Used     Comment: quit 80's  . Alcohol Use: No  . Drug Use: No  . Sexual Activity: No   Other Topics Concern  . None   Social History Narrative   Exercise 20-30 min walks. Patient is married. Education: [employer].    Family History  Problem Relation Age of Onset  . Aortic aneurysm Father   . Heart disease Father   . Hypertension Father   . Hyperlipidemia Father   . Hyperlipidemia    . Colon cancer Sister 33  . Cancer Sister   . Esophageal cancer Neg Hx   . Stomach cancer Neg Hx   . Rectal cancer Neg Hx   . Cancer Sister    No Known Allergies Prior to Admission medications   Medication Sig Start Date End Date Taking? Authorizing Provider  aspirin 81 MG EC tablet Take 81 mg by mouth daily.     Yes Historical Provider, MD  atorvastatin (LIPITOR) 40 MG tablet Take 1 tablet (40 mg total) by mouth daily at 6 PM. 10/21/15  Yes Remo Lipps  A Caley Ciaramitaro, MD  Calcium Carbonate-Vitamin D (CALCIUM 600/VITAMIN D) 600-400 MG-UNIT per tablet Take 1 tablet by mouth 2 (two) times daily.     Yes Historical Provider, MD  carboxymethylcellulose (RETAINE CMC) 0.5 % SOLN 1 drop 3 (three) times daily as needed.   Yes Historical Provider, MD  co-enzyme Q-10 30 MG capsule Take 30 mg by mouth 2 (two) times daily.     Yes Historical Provider, MD  fish oil-omega-3 fatty acids 1000 MG capsule Take 1 g by mouth 2 (two) times daily. Nordic naturals omega 3 TID   Yes Historical Provider, MD  fluticasone (FLONASE) 50 MCG/ACT nasal spray USE 2 SPRAYS IN EACH NOSTRIL DAILY. 10/21/15  Yes [name] Russian, MD  metoprolol succinate (TOPROL-XL) 25 MG 24 hr tablet Take 0.5 tablets (12.5 mg total) by mouth daily. 10/21/15  Yes [name] Russian, MD  Multiple Vitamins-Minerals (VISION-VITE PRESERVE PO) Take by mouth. 2 per day   Yes Historical Provider, MD  NON FORMULARY HYDROCTISONE BUT 0.1% CREAM 45GM PRN   Yes Historical Provider, MD  Probiotic Product (PROBIOTIC DAILY PO) Take  by mouth.   Yes Historical Provider, MD  TURMERIC PO Take 2,000 mg by mouth daily.   Yes Historical Provider, MD  Polyethyl Glycol-Propyl Glycol (SYSTANE OP) Apply to eye as needed. Reported on 11/05/2015    Historical Provider, MD   ROS: The patient denies chills, night sweats, unintentional weight loss, chest pain, palpitations, wheezing, dyspnea on exertion, nausea, vomiting, abdominal pain, dysuria, hematuria, melena, numbness, weakness, or tingling.   All other systems have been reviewed and were otherwise negative with the exception of those mentioned in the HPI and as above.    PHYSICAL EXAM: Filed Vitals:   11/05/15 1042  BP: 121/68  Pulse: 65  Temp: 97.9 F (36.6 C)  Resp: 18   Body mass index is 24.38 kg/(m^2).  General: Alert, no acute distress HEENT:  Normocephalic, atraumatic, oropharynx patent. Cerumen in both ears.  Eye: [facility] Cardiovascular: Regular rate and rhythm, no rubs murmurs or gallops. No Carotid bruits, radial pulse intact. No pedal edema.  Respiratory: Rhonchi with mild prolongation of expiration in R mid lung. No wheezes or rales. No cyanosis, no use of accessory musculature.  Abdominal: No organomegaly, abdomen is soft and non-tender, positive bowel sounds. No masses. Musculoskeletal: Gait intact. No edema, tenderness Skin: No rashes. Neurologic: Facial musculature symmetric. Psychiatric: Patient acts appropriately throughout our interaction. Lymphatic: No cervical or submandibular lymphadenopathy  LABS: Results for orders placed or performed in visit on 11/05/15  POCT CBC  Result Value Ref Range   WBC 8.0 4.6 - 10.2 K/uL   Lymph, poc 1.1 0.6 - 3.4   POC LYMPH PERCENT 13.2 10 - 50 %L   MID (cbc) 0.8 0 - 0.9   POC MID % 10.5 0 - 12 %M   POC Granulocyte 6.1 2 - 6.9   Granulocyte percent 76.3 37 - 80 %G   RBC 4.63 (A) 4.69 - 6.13 M/uL   Hemoglobin 14.3 14.1 - 18.1 g/dL   HCT, POC 41.3 (A) 43.5 - 53.7 %   MCV 89.2 80 - 97 fL   MCH, POC 30.8  27 - 31.2 pg   MCHC 34.5 31.8 - 35.4 g/dL   RDW, POC 12.4 %   Platelet Count, POC 266 142 - 424 K/uL   MPV 6.6 0 - 99.8 fL     EKG/XRAY:   Primary read interpreted by Dr. Everlene Farrier at [facility]. Dg Chest 2 View  11/05/2015  CLINICAL DATA:  Cough and fever ; history of valvular heart disease, cardiomyopathy, benign lung tumor. EXAM: CHEST  2 VIEW COMPARISON:  PA and lateral chest x-ray of Oct 26, 2015 FINDINGS: The lungs remain hyperinflated. There is new patchy interstitial density in the right lower lung. The left lung is clear. The heart is normal in size. The pulmonary vascularity is not engorged. There are surgical clips in the right hilum and there is surgical suture material medially in the right upper lobe. The mediastinum is normal in width. The bony thorax exhibits no acute abnormality. IMPRESSION: Patchy increased density in the right mid and lower lung greater than that seen on the previous studies is worrisome for acute pneumonia. Followup PA and lateral chest X-ray is recommended in 3-4 weeks following trial of antibiotic therapy to ensure resolution and exclude underlying malignancy. Electronically Signed   By: David  Martinique M.D.   On: 11/05/2015 12:09   Dg Chest 2 View  10/26/2015  CLINICAL DATA:  Abnormal chest CT.  Cough EXAM: CHEST  2 VIEW COMPARISON:  Chest CT 04/05/2015 FINDINGS: Mild elevation of the right hemidiaphragm, stable. Areas of scarring in the right lower lung. Left lung is clear. Heart is normal size. Postoperative changes on the right. No effusions or acute bony abnormality. IMPRESSION: Stable postoperative changes on the right.  No acute findings. Electronically Signed   By: Rolm Baptise M.D.   On: 10/26/2015 11:59   ASSESSMENT/PLAN: White count is normal. Patient will be treated for community-acquired pneumonia. He was given a shot of Rocephin and placed on doxycycline. We'll recheck on Monday. We'll make follow-up referral to Dr. Melvyn Novas.I personally performed the services  described in this documentation, which was scribed in my presence. The recorded information has been reviewed and is accurate.   Gross sideeffects, risk and benefits, and alternatives of medications d/w patient. Patient is aware that all medications have potential sideeffects and we are unable to predict every sideeffect or drug-drug interaction that may occur.  Arlyss Queen MD 11/05/2015 11:38 AM

## 2015-11-08 ENCOUNTER — Ambulatory Visit (INDEPENDENT_AMBULATORY_CARE_PROVIDER_SITE_OTHER): Payer: Medicare HMO | Admitting: Emergency Medicine

## 2015-11-08 ENCOUNTER — Ambulatory Visit (INDEPENDENT_AMBULATORY_CARE_PROVIDER_SITE_OTHER): Payer: Medicare HMO

## 2015-11-08 VITALS — BP 118/76 | HR 66 | Temp 97.6°F | Resp 18 | Ht 72.0 in | Wt 178.2 lb

## 2015-11-08 DIAGNOSIS — R509 Fever, unspecified: Secondary | ICD-10-CM | POA: Diagnosis not present

## 2015-11-08 DIAGNOSIS — J189 Pneumonia, unspecified organism: Secondary | ICD-10-CM

## 2015-11-08 DIAGNOSIS — R938 Abnormal findings on diagnostic imaging of other specified body structures: Secondary | ICD-10-CM

## 2015-11-08 DIAGNOSIS — R9389 Abnormal findings on diagnostic imaging of other specified body structures: Secondary | ICD-10-CM

## 2015-11-08 NOTE — Patient Instructions (Addendum)
   IF you received an x-ray today, you will receive an invoice from [facility]. Please contact  Radiology at [phone] with questions or concerns regarding your invoice.   IF you received labwork today, you will receive an invoice from [facility]. Please contact [facility] at [phone] with questions or concerns regarding your invoice.   Our billing staff will not be able to assist you with questions regarding bills from these companies.  You will be contacted with the lab results as soon as they are available. The fastest way to get your results is to activate your My Chart account. Instructions are located on the last page of this paperwork. If you have not heard from us regarding the results in 2 weeks, please contact this office.      Community-Acquired Pneumonia, Adult Pneumonia is an infection of the lungs. There are different types of pneumonia. One type can develop while a person is in a hospital. A different type, called community-acquired pneumonia, develops in people who are not, or have not recently been, in the hospital or other health care facility.  CAUSES Pneumonia may be caused by bacteria, viruses, or funguses. Community-acquired pneumonia is often caused by Streptococcus pneumonia bacteria. These bacteria are often passed from one person to another by breathing in droplets from the cough or sneeze of an infected person. RISK FACTORS The condition is more likely to develop in:  People who havechronic diseases, such as chronic obstructive pulmonary disease (COPD), asthma, congestive heart failure, cystic fibrosis, diabetes, or kidney disease.  People who haveearly-stage or late-stage HIV.  People who havesickle cell disease.  People who havehad their spleen removed (splenectomy).  People who havepoor dental hygiene.  People who havemedical conditions that increase the risk of breathing in (aspirating)  secretions their own mouth and nose.   People who havea weakened immune system (immunocompromised).  People who smoke.  People whotravel to areas where pneumonia-causing germs commonly exist.  People whoare around animal habitats or animals that have pneumonia-causing germs, including birds, bats, rabbits, cats, and farm animals. SYMPTOMS Symptoms of this condition include:  Adry cough.  A wet (productive) cough.  Fever.  Sweating.  Chest pain, especially when breathing deeply or coughing.  Rapid breathing or difficulty breathing.  Shortness of breath.  Shaking chills.  Fatigue.  Muscle aches. DIAGNOSIS Your health care provider will take a medical history and perform a physical exam. You may also have other tests, including:  Imaging studies of your chest, including X-rays.  Tests to check your blood oxygen level and other blood gases.  Other tests on blood, mucus (sputum), fluid around your lungs (pleural fluid), and urine. If your pneumonia is severe, other tests may be done to identify the specific cause of your illness. TREATMENT The type of treatment that you receive depends on many factors, such as the cause of your pneumonia, the medicines you take, and other medical conditions that you have. For most adults, treatment and recovery from pneumonia may occur at home. In some cases, treatment must happen in a hospital. Treatment may include:  Antibiotic medicines, if the pneumonia was caused by bacteria.  Antiviral medicines, if the pneumonia was caused by a virus.  Medicines that are given by mouth or through an IV tube.  Oxygen.  Respiratory therapy. Although rare, treating severe pneumonia may include:  Mechanical ventilation. This is done if you are not breathing well on your own and you cannot maintain a safe blood oxygen level.    Thoracentesis. This procedureremoves fluid around one lung or both lungs to help you breathe better. HOME CARE  INSTRUCTIONS  Take over-the-counter and prescription medicines only as told by your health care provider.  Only takecough medicine if you are losing sleep. Understand that cough medicine can prevent your body's natural ability to remove mucus from your lungs.  If you were prescribed an antibiotic medicine, take it as told by your health care provider. Do not stop taking the antibiotic even if you start to feel better.  Sleep in a semi-upright position at night. Try sleeping in a reclining chair, or place a few pillows under your head.  Do not use tobacco products, including cigarettes, chewing tobacco, and e-cigarettes. If you need help quitting, ask your health care provider.  Drink enough water to keep your urine clear or pale yellow. This will help to thin out mucus secretions in your lungs. PREVENTION There are ways that you can decrease your risk of developing community-acquired pneumonia. Consider getting a pneumococcal vaccine if:  You are older than 80 years of age.  You are older than 80 years of age and are undergoing cancer treatment, have chronic lung disease, or have other medical conditions that affect your immune system. Ask your health care provider if this applies to you. There are different types and schedules of pneumococcal vaccines. Ask your health care provider which vaccination option is best for you. You may also prevent community-acquired pneumonia if you take these actions:  Get an influenza vaccine every year. Ask your health care provider which type of influenza vaccine is best for you.  Go to the dentist on a regular basis.  Wash your hands often. Use hand sanitizer if soap and water are not available. SEEK MEDICAL CARE IF:  You have a fever.  You are losing sleep because you cannot control your cough with cough medicine. SEEK IMMEDIATE MEDICAL CARE IF:  You have worsening shortness of breath.  You have increased chest pain.  Your sickness becomes  worse, especially if you are an older adult or have a weakened immune system.  You cough up blood.   This information is not intended to replace advice given to you by your health care provider. Make sure you discuss any questions you have with your health care provider.   Document Released: 05/22/2005 Document Revised: 02/10/2015 Document Reviewed: 09/16/2014 Elsevier Interactive Patient Education 2016 Elsevier Inc.  

## 2015-11-08 NOTE — Progress Notes (Signed)
Subjective:  This chart was scribed for Arlyss Queen MD, by Tamsen Roers, at [facility].  This patient was seen in room 5 and the patient's care was started at 11:24 AM.   Chief Complaint  Patient presents with  . Follow-up    feeling better than a few days ago  . Fever  . Headache    still has a little headache but its going away  . Cough    still has coughing; not frequent     Patient ID: Jonette Mate, male    DOB: 02-11-1931, 82 y.o.   MRN: [id number]  HPI  HPI Comments: TOSHIO MCKIERNAN is a 82 y.o. male who presents to the [facility] for a follow up after his last visit (three days ago). He still has a slight headache but states that it is going away.  He is still coughing (productive intermittently) but not as frequent as his last visit.  Patient denies a fever/chills. He has been compliant with his medication. He is willing to come back later this week for a follow up.  He takes an aleve at night time which helps him sleep from his headache.    Patient Active Problem List   Diagnosis Date Noted  . Inguinal hernia 09/29/2014  . Cardiomyopathy ([place]) 01/26/2012  . Abnormal chest CT 01/26/2012  . Tricuspid regurgitation 12/06/2011  . Aneurysm of thoracic aorta ([place]) 11/06/2008  . CARDIOVASCULAR STUDIES, ABNORMAL 11/06/2008  . HYPERLIPIDEMIA 11/05/2008  . MITRAL REGURGITATION 11/05/2008  . MITRAL VALVE PROLAPSE 11/05/2008  . BUNDLE BRANCH BLOCK, RIGHT 11/05/2008  . PALPITATIONS 11/05/2008  . PURE HYPERCHOLESTEROLEMIA 09/30/2008   Past Medical History  Diagnosis Date  . RBBB (right bundle branch block)   . Hyperlipidemia   . Mitral valve prolapse     irregular heart beat  . Palpitations   . Pure hypercholesterolemia   . Dilated aortic root (HCC)     Hx of mild  . Benign tumor of lung   . BPH (benign prostatic hyperplasia)   . Cardiomyopathy ([place])   . Cataract    Past Surgical History  Procedure Laterality Date    . Excision on neck      Excision of 2 cm soft tissue mass of left neck  . Resection of lung      Hx of, right lung secondary to benign tumor  . Testicular hernia    . Shoulder arthroscopy      Lt  . Cosmetic surgery    . Vasectomy    . Eye surgery    . Hernia repair     No Known Allergies Prior to Admission medications   Medication Sig Start Date End Date Taking? Authorizing Provider  aspirin 81 MG EC tablet Take 81 mg by mouth daily.     Yes Historical Provider, MD  atorvastatin (LIPITOR) 40 MG tablet Take 1 tablet (40 mg total) by mouth daily at 6 PM. 10/21/15  Yes [name], MD  Calcium Carbonate-Vitamin D (CALCIUM 600/VITAMIN D) 600-400 MG-UNIT per tablet Take 1 tablet by mouth 2 (two) times daily.     Yes Historical Provider, MD  carboxymethylcellulose (RETAINE CMC) 0.5 % SOLN 1 drop 3 (three) times daily as needed.   Yes Historical Provider, MD  co-enzyme Q-10 30 MG capsule Take 30 mg by mouth 2 (two) times daily.     Yes Historical Provider, MD  doxycycline (VIBRA-TABS) 100 MG tablet Take 1 tablet (100 mg  total) by mouth 2 (two) times daily. 11/05/15  Yes [name], MD  fish oil-omega-3 fatty acids 1000 MG capsule Take 1 g by mouth 2 (two) times daily. Nordic naturals omega 3 TID   Yes Historical Provider, MD  fluticasone (FLONASE) 50 MCG/ACT nasal spray USE 2 SPRAYS IN EACH NOSTRIL DAILY. 10/21/15  Yes [name], MD  metoprolol succinate (TOPROL-XL) 25 MG 24 hr tablet Take 0.5 tablets (12.5 mg total) by mouth daily. 10/21/15  Yes [name], MD  Multiple Vitamins-Minerals (VISION-VITE PRESERVE PO) Take by mouth. 2 per day   Yes Historical Provider, MD  NON FORMULARY HYDROCTISONE BUT 0.1% CREAM 45GM PRN   Yes Historical Provider, MD  Polyethyl Glycol-Propyl Glycol (SYSTANE OP) Apply to eye as needed. Reported on 11/05/2015   Yes Historical Provider, MD  Probiotic Product (PROBIOTIC DAILY PO) Take by mouth.   Yes Historical Provider, MD  TURMERIC PO Take 2,000 mg by mouth  daily.   Yes Historical Provider, MD   Social History   Social History  . Marital Status: Married    Spouse Name: N/A  . Number of Children: N/A  . Years of Education: N/A   Occupational History  . [place] History Main Topics  . Smoking status: Former Research scientist (life sciences)  . Smokeless tobacco: Never Used     Comment: quit 80's  . Alcohol Use: No  . Drug Use: No  . Sexual Activity: No   Other Topics Concern  . Not on file   Social History Narrative   Exercise 20-30 min walks. Patient is married. Education: [employer].         Review of Systems  Constitutional: Negative for fever and chills.  Respiratory: Positive for cough. Negative for choking and shortness of breath.   Gastrointestinal: Negative for nausea and vomiting.  Musculoskeletal: Negative for neck pain and neck stiffness.  Neurological: Positive for headaches. Negative for seizures, syncope and speech difficulty.       Objective:   Physical Exam Filed Vitals:   11/08/15 1032  BP: 118/76  Pulse: 66  Temp: 97.6 F (36.4 C)  TempSrc: Oral  Resp: 18  Height: 6' (1.829 m)  Weight: 178 lb 3.2 oz (80.831 kg)  SpO2: 95%    CONSTITUTIONAL: Well developed/well nourished HEAD: Normocephalic/atraumatic EYES: EOMI/PERRL NECK: supple no meningeal signs LUNGS: bilateral rhonchi but good air exchange.  NEURO: Pt is awake/alert/appropriate, moves all extremitiesx4.  No facial droop.   SKIN: warm, color normal PSYCH: no abnormalities of mood noted, alert and oriented to situation  Dg Chest 2 View  11/08/2015  CLINICAL DATA:  Followup pneumonia EXAM: CHEST  2 VIEW COMPARISON:  11/05/2015 FINDINGS: Normal heart size. Aortic atherosclerosis noted. Airspace opacities within the right midlung and right base are again noted and appear unchanged from the previous exam. Left lung remains clear. IMPRESSION: 1. Persistent right midlung and right base airspace densities. Electronically Signed   By: Kerby Moors M.D.   On:  11/08/2015 11:43   Dg Chest 2 View  11/05/2015  CLINICAL DATA:  Cough and fever ; history of valvular heart disease, cardiomyopathy, benign lung tumor. EXAM: CHEST  2 VIEW COMPARISON:  PA and lateral chest x-ray of Oct 26, 2015 FINDINGS: The lungs remain hyperinflated. There is new patchy interstitial density in the right lower lung. The left lung is clear. The heart is normal in size. The pulmonary vascularity is not engorged. There are surgical clips in the right hilum and there is  surgical suture material medially in the right upper lobe. The mediastinum is normal in width. The bony thorax exhibits no acute abnormality. IMPRESSION: Patchy increased density in the right mid and lower lung greater than that seen on the previous studies is worrisome for acute pneumonia. Followup PA and lateral chest X-ray is recommended in 3-4 weeks following trial of antibiotic therapy to ensure resolution and exclude underlying malignancy. Electronically Signed   By: David  Martinique M.D.   On: 11/05/2015 12:09   Dg Chest 2 View  10/26/2015  CLINICAL DATA:  Abnormal chest CT.  Cough EXAM: CHEST  2 VIEW COMPARISON:  Chest CT 04/05/2015 FINDINGS: Mild elevation of the right hemidiaphragm, stable. Areas of scarring in the right lower lung. Left lung is clear. Heart is normal size. Postoperative changes on the right. No effusions or acute bony abnormality. IMPRESSION: Stable postoperative changes on the right.  No acute findings. Electronically Signed   By: Rolm Baptise M.D.   On: 10/26/2015 11:59       Assessment & Plan:  Per radiology chest x-ray is unchanged. Patient states he is feeling better. We'll recheck on Friday. No change in medication.I personally performed the services described in this documentation, which was scribed in my presence. The recorded information has been reviewed and is accurate.

## 2015-11-10 ENCOUNTER — Encounter: Payer: Self-pay | Admitting: Internal Medicine

## 2015-11-10 ENCOUNTER — Ambulatory Visit (INDEPENDENT_AMBULATORY_CARE_PROVIDER_SITE_OTHER): Payer: Medicare HMO | Admitting: Internal Medicine

## 2015-11-10 VITALS — BP 114/70 | HR 71 | Temp 97.8°F | Ht 72.0 in | Wt 177.0 lb

## 2015-11-10 DIAGNOSIS — J479 Bronchiectasis, uncomplicated: Secondary | ICD-10-CM

## 2015-11-10 MED ORDER — AMOXICILLIN-POT CLAVULANATE 875-125 MG PO TABS: 1.0000 | ORAL_TABLET | Freq: Two times a day (BID) | ORAL | Status: DC

## 2015-11-10 MED ORDER — AMOXICILLIN-POT CLAVULANATE 875-125 MG PO TABS
1.0000 | ORAL_TABLET | Freq: Two times a day (BID) | ORAL | Status: DC
Start: 2015-11-10 — End: 2015-11-10

## 2015-11-10 MED ORDER — FLUTTER DEVI: Status: DC

## 2015-11-10 NOTE — Patient Instructions (Addendum)
If not better after finish the doxycycline >  Augmentin 875 mg take one pill twice daily  X 10 days - take at breakfast and supper with large glass of water.  It would help reduce the usual side effects (diarrhea and yeast infections) if you ate cultured yogurt at lunch.   For cough > mucinex dm 1200 mg every 12 hours and use the flutter valve as much you can  Whenever coughing at all stop fish oil and eat more fish  Please schedule a follow up office visit in 4 weeks, sooner if needed with cxr  On return  Needs Ig profile/ alpha one screen next ov

## 2015-11-10 NOTE — Progress Notes (Signed)
Subjective:    Patient ID: [name] [name], male    DOB: 1931-01-27    MRN: [id number]    Brief patient profile:  84 yowm quit smoking 1985 eval by [facility]nick >  R lung "collapse/blockage" >  R lung surgery around 1985 (? [facility] [place]) dx "benign tumor"  With no chemo/RT, followed by [facility]/ [facility]  For a few years then no further specialty f/u but CT chest 10/05/14 suggested new changes in RLL so referred to pulmonary clinic 10/26/14 by Dr Perfecto Kingdom with documented bronchiectasis on f/u CT 04/05/15    History of Present Illness  10/26/2014  Clemmie Marxen consultation  Re abn cxr   Chief Complaint  Patient presents with  . Pulmonary Consult    Referred by Dr Everlene Farrier. Pt states that mass on lung was noticied 2 years ago and was stable until now. Pt stated that he was told mass has increased. Denies any breathing problems during visit   Not limited by breathing from desired activities  And quite active but not aerobic rec CT chest 04/05/15 all benign changes with mild bronchiectasis in the RLL    10/26/15 MW/ov cc uri / mostly nasal symptoms x sev days> no change on cxr >  did not rec change in rx  11/05/15 Dr Everlene Farrier ov/ cough persisted since uri >new RLL ASdz >  rx rocephin x one and rx doxy   11/10/2015  f/u ov/Ellissa Ayo re: bronchiectasis RLL with flare  Of cough x late May 2017  Chief Complaint  Patient presents with  . Follow-up    Pt dxed with PNA at appt with Dr. Everlene Farrier on 11/05/15. He is currently being txed with Doxy and feeling some better. Still has some cough with white sputum.   still has 5 days of doxy/ overall improving on doxy and stated had done fine off all rx until May s chronic cough or nasal complaints   No obvious day to day or daytime variability or assoc sob   purulent sputum or mucus plugs or hemoptysis or cp or chest tightness, subjective wheeze or overt hb symptoms. No unusual exp hx or h/o childhood pna/ asthma or knowledge of premature birth.  Sleeping ok without nocturnal  or early am  exacerbation  of respiratory  c/o's or need for noct saba. Also denies any obvious fluctuation of symptoms with weather or environmental changes or other aggravating or alleviating factors except as outlined above   Current Medications, Allergies, Complete Past Medical History, Past Surgical History, Family History, and Social History were reviewed in [facility] record.  ROS  The following are not active complaints unless bolded sore throat, dysphagia, dental problems, itching, sneezing,  nasal congestion   or excess/ purulent secretions, ear ache,   fever, chills, sweats, unintended wt loss, classically pleuritic or exertional cp, hemoptysis,  orthopnea pnd or leg swelling, presyncope, palpitations, abdominal pain, anorexia, nausea, vomiting, diarrhea  or change in bowel or bladder habits, change in stools or urine, dysuria,hematuria,  rash, arthralgias, visual complaints, headache, numbness, weakness or ataxia or problems with walking or coordination,  change in mood/affect or memory.                        Objective:   Physical Exam  amb wm nad/ congested rattling cough   11/10/2015      177   10/26/2015        178  10/26/14 177 lb (80.287 kg)  09/29/14 177  lb 9.6 oz (80.559 kg)  12/18/13 175 lb (79.379 kg)    Vital signs reviewed    HEENT: nl dentition,  and orophanx. Nl external ear canals without cough reflex - moderate bilateral non-specific turbinate edema     NECK :  without JVD/Nodes/TM/ nl carotid upstrokes bilaterally   LUNGS: no acc muscle use,  A few ronchi R > > L insp and exp    CV:  RRR  no s3 or murmur or increase in P2, no edema   ABD:  soft and nontender with nl excursion in the supine position. No bruits or organomegaly, bowel sounds nl  MS:  warm without deformities, calf tenderness, cyanosis or clubbing  SKIN: warm and dry without lesions    NEURO:  alert, approp, no deficits          I personally reviewed images and  agree with radiology impression as follows:  CXR:  11/08/15  Persistent right midlung and right base airspace densities.        Assessment & Plan:

## 2015-11-11 NOTE — Assessment & Plan Note (Addendum)
CT chest 04/05/2015 1. Interval near complete resolution of the rounded mass like lesion in the lateral RIGHT lower lobe consistent with resolution of rounded atelectasis versus rounded pneumonia. 2. Persistent mild interstitial banding and bronchiectasis in the RIGHT lower lobe at site above inflammation / infection. 3. Stable small scattered pulmonary nodules. 4. Stable postsurgical change in the RIGHT upper lobe >> f/u cxr 10/26/2015 > ok then  - Prevnar 12/09/13 - Flutter valve added 11/10/2015    Recurrent pna in pt with extensive scarring and some bronchiectasis in the R lower lung or whatever is left of it s/p remote lung surgery in 1985.  Will rx as we would for bronchiectasis and doing fine on doxy but if not completely clear perhaps add augmentin (since may also have some sinus dz)  next and f/u conservatively with cxr   Discussed the dx/pathyophysiology of bronchiectasis using the escalator analogy/ start flutter now and f/u in 4 weeks, sooner if needed with alpha one screening/ Ig profile to be complete.  I had an extended discussion with the patient reviewing all relevant studies completed to date and  lasting 15 to 20 minutes of a 25 minute visit    Each maintenance medication was reviewed in detail including most importantly the difference between maintenance and prns and under what circumstances the prns are to be triggered using an action plan format that is not reflected in the computer generated alphabetically organized AVS.    Please see instructions for details which were reviewed in writing and the patient given a copy highlighting the part that I personally wrote and discussed at today's ov.

## 2015-11-25 NOTE — Progress Notes (Signed)
HPI: FU palpitations, cardiomyopathy and family history of abdominal aortic aneurysm. He had an abdominal ultrasound in July 2008 that showed no abdominal aortic aneurysm. Carotid dopplers were performed on January 01, 2008. He was found to have normal carotid arteries bilaterally. Myoview in September of 2013. Ejection fraction was 53% and the perfusion was normal. PFTs in September of 2013 showed mild obstructive defect. Echocardiogram repeated July 2015 and showed normal LV function and mildly dilated ascending aorta. Since I last saw him the patient has dyspnea with more extreme activities but not with routine activities. It is relieved with rest. It is not associated with chest pain. There is no orthopnea, PND or pedal edema. There is no syncope or palpitations. There is no exertional chest pain.    Current Outpatient Prescriptions  Medication Sig Dispense Refill  . amoxicillin-clavulanate (AUGMENTIN) 875-125 MG tablet Take 1 tablet by mouth 2 (two) times daily. 20 tablet 0  . aspirin 81 MG EC tablet Take 81 mg by mouth daily.      Marland Kitchen atorvastatin (LIPITOR) 40 MG tablet Take 1 tablet (40 mg total) by mouth daily at 6 PM. 30 tablet 11  . Calcium Carbonate-Vitamin D (CALCIUM 600/VITAMIN D) 600-400 MG-UNIT per tablet Take 1 tablet by mouth 2 (two) times daily.      . carboxymethylcellulose (RETAINE CMC) 0.5 % SOLN 1 drop 3 (three) times daily as needed.    Marland Kitchen co-enzyme Q-10 30 MG capsule Take 30 mg by mouth 2 (two) times daily.      Marland Kitchen doxycycline (VIBRA-TABS) 100 MG tablet Take 1 tablet (100 mg total) by mouth 2 (two) times daily. 20 tablet 0  . fish oil-omega-3 fatty acids 1000 MG capsule Take 1 g by mouth daily. Nordic naturals omega 3 TID    . fluticasone (FLONASE) 50 MCG/ACT nasal spray USE 2 SPRAYS IN EACH NOSTRIL DAILY. 16 g 11  . metoprolol succinate (TOPROL-XL) 25 MG 24 hr tablet Take 0.5 tablets (12.5 mg total) by mouth daily. 30 tablet 11  . Multiple Vitamins-Minerals (VISION-VITE  PRESERVE PO) Take by mouth. 2 per day    . NON FORMULARY HYDROCTISONE BUT 0.1% CREAM 45GM PRN    . Polyethyl Glycol-Propyl Glycol (SYSTANE OP) Apply to eye as needed. Reported on 11/05/2015    . Probiotic Product (PROBIOTIC DAILY PO) Take by mouth.    . Respiratory Therapy Supplies (FLUTTER) DEVI Use as directed 1 each 0  . TURMERIC PO Take 2,000 mg by mouth daily.     No current facility-administered medications for this visit.     Past Medical History  Diagnosis Date  . RBBB (right bundle branch block)   . Hyperlipidemia   . Mitral valve prolapse     irregular heart beat  . Palpitations   . Pure hypercholesterolemia   . Dilated aortic root (HCC)     Hx of mild  . Benign tumor of lung   . BPH (benign prostatic hyperplasia)   . Cardiomyopathy ([place])   . Cataract     Past Surgical History  Procedure Laterality Date  . Excision on neck      Excision of 2 cm soft tissue mass of left neck  . Resection of lung      Hx of, right lung secondary to benign tumor  . Testicular hernia    . Shoulder arthroscopy      Lt  . Cosmetic surgery    . Vasectomy    . Eye surgery    .  Hernia repair      Social History   Social History  . Marital Status: Married    Spouse Name: N/A  . Number of Children: N/A  . Years of Education: N/A   Occupational History  . [place] History Main Topics  . Smoking status: Former Research scientist (life sciences)  . Smokeless tobacco: Never Used     Comment: quit 80's  . Alcohol Use: No  . Drug Use: No  . Sexual Activity: No   Other Topics Concern  . Not on file   Social History Narrative   Exercise 20-30 min walks. Patient is married. Education: [employer].     Family History  Problem Relation Age of Onset  . Aortic aneurysm Father   . Heart disease Father   . Hypertension Father   . Hyperlipidemia Father   . Hyperlipidemia    . Colon cancer Sister 21  . Cancer Sister   . Esophageal cancer Neg Hx   . Stomach cancer Neg Hx   . Rectal cancer Neg Hx     . Cancer Sister     ROS: no fevers or chills, productive cough, hemoptysis, dysphasia, odynophagia, melena, hematochezia, dysuria, hematuria, rash, seizure activity, orthopnea, PND, pedal edema, claudication. Remaining systems are negative.  Physical Exam: Well-developed well-nourished in no acute distress.  Skin is warm and dry.  HEENT is normal.  Neck is supple.  Chest is clear to auscultation with normal expansion.  Cardiovascular exam is regular rate and rhythm.  Abdominal exam nontender or distended. No masses palpated. Extremities show no edema. neuro grossly intact  ECG Sinus rhythm, right bundle branch block, cannot rule out prior septal infarct.  Assessment and plan  1 hyperlipidemia-continue statin. Lipids and liver monitored by primary care. 2 palpitations-symptoms are controlled. Continue beta blocker. 3 history of mildly dilated aortic root/thoracic aortic aneurysm-mild on most recent echocardiogram. We will repeat studies in the future. 4 Cardiomyopathy-LV function improved on most recent echo. Continue beta blocker.  Kirk Ruths, MD

## 2015-12-02 ENCOUNTER — Encounter: Payer: Self-pay | Admitting: Cardiology

## 2015-12-02 ENCOUNTER — Ambulatory Visit (INDEPENDENT_AMBULATORY_CARE_PROVIDER_SITE_OTHER): Payer: Medicare HMO | Admitting: Cardiology

## 2015-12-02 VITALS — BP 94/66 | HR 58 | Ht 72.0 in | Wt 173.0 lb

## 2015-12-02 DIAGNOSIS — I712 Thoracic aortic aneurysm, without rupture, unspecified: Secondary | ICD-10-CM

## 2015-12-02 DIAGNOSIS — I429 Cardiomyopathy, unspecified: Secondary | ICD-10-CM | POA: Diagnosis not present

## 2015-12-02 DIAGNOSIS — R002 Palpitations: Secondary | ICD-10-CM | POA: Diagnosis not present

## 2015-12-02 NOTE — Patient Instructions (Signed)
Your physician wants you to follow-up in: ONE YEAR WITH DR CRENSHAW You will receive a reminder letter in the mail two months in advance. If you don't receive a letter, please call our office to schedule the follow-up appointment.   If you need a refill on your cardiac medications before your next appointment, please call your pharmacy.  

## 2015-12-08 ENCOUNTER — Ambulatory Visit (INDEPENDENT_AMBULATORY_CARE_PROVIDER_SITE_OTHER)
Admission: RE | Admit: 2015-12-08 | Discharge: 2015-12-08 | Disposition: A | Discharge status: Home / Self Care | Payer: Medicare HMO | Source: Ambulatory Visit | Attending: Internal Medicine | Admitting: Internal Medicine

## 2015-12-08 ENCOUNTER — Ambulatory Visit (INDEPENDENT_AMBULATORY_CARE_PROVIDER_SITE_OTHER): Payer: Medicare HMO | Admitting: Internal Medicine

## 2015-12-08 ENCOUNTER — Encounter: Payer: Self-pay | Admitting: Internal Medicine

## 2015-12-08 ENCOUNTER — Other Ambulatory Visit: Payer: Medicare HMO

## 2015-12-08 VITALS — BP 128/80 | HR 72 | Ht 72.0 in | Wt 175.0 lb

## 2015-12-08 DIAGNOSIS — J471 Bronchiectasis with (acute) exacerbation: Secondary | ICD-10-CM | POA: Diagnosis not present

## 2015-12-08 DIAGNOSIS — J189 Pneumonia, unspecified organism: Secondary | ICD-10-CM

## 2015-12-08 DIAGNOSIS — J479 Bronchiectasis, uncomplicated: Secondary | ICD-10-CM | POA: Diagnosis not present

## 2015-12-08 NOTE — Progress Notes (Signed)
Subjective:    Patient ID: [name] [name], male    DOB: 06/04/1931    MRN: [id number]    Brief patient profile:  84 yowm quit smoking 1985 eval by [facility] >  R lung "collapse/blockage" >  R lung surgery around 1985 (? [facility] [place]) dx "benign tumor"  With no chemo/RT, followed by [facility]/ Slotnick  For a few years then no further specialty f/u but CT chest 10/05/14 suggested new changes in RLL so referred to pulmonary clinic 10/26/14 by Dr Perfecto Kingdom with documented bronchiectasis on f/u CT 04/05/15    History of Present Illness  10/26/2014  [name] [name] consultation  Re abn cxr   Chief Complaint  Patient presents with  . Pulmonary Consult    Referred by Dr Everlene Farrier. Pt states that mass on lung was noticied 2 years ago and was stable until now. Pt stated that he was told mass has increased. Denies any breathing problems during visit   Not limited by breathing from desired activities  And quite active but not aerobic rec CT chest 04/05/15 all benign changes with mild bronchiectasis in the RLL    10/26/15 MW/ov cc uri / mostly nasal symptoms x sev days> no change on cxr >  did not rec change in rx  11/05/15 Dr Everlene Farrier ov/ cough persisted since uri >new RLL ASdz >  rx rocephin x one and rx doxy   11/10/2015  f/u ov/[name] [name] re: bronchiectasis RLL with flare  Of cough x late May 2017  Chief Complaint  Patient presents with  . Follow-up    Pt dxed with PNA at appt with Dr. Everlene Farrier on 11/05/15. He is currently being txed with Doxy and feeling some better. Still has some cough with white sputum.   still has 5 days of doxy/ overall improving on doxy and stated had done fine off all rx until May s chronic cough or nasal complaints rec If not better after finish the doxycycline >  Augmentin 875 mg take one pill twice daily  X 10 days  For cough > mucinex dm 1200 mg every 12 hours and use the flutter valve as much you can Whenever coughing at all stop fish oil and eat more fish Please schedule a follow up office visit in 4  weeks, sooner if needed with cxr  On return  Needs Ig profile/ alpha one screen next ov    12/08/2015  f/u ov/[name] [name] re: bronchiectasis/ f/u pna Chief Complaint  Patient presents with  . Follow-up    CXR done today. His breathing has returned to baseline. No new co's today.   Not limited by breathing from desired activities  / no longer using flutter valv e   No obvious day to day or daytime variability or assoc sob   purulent sputum or mucus plugs or hemoptysis or cp or chest tightness, subjective wheeze or overt hb symptoms. No unusual exp hx or h/o childhood pna/ asthma or knowledge of premature birth.  Sleeping ok without nocturnal  or early am exacerbation  of respiratory  c/o's or need for noct saba. Also denies any obvious fluctuation of symptoms with weather or environmental changes or other aggravating or alleviating factors except as outlined above   Current Medications, Allergies, Complete Past Medical History, Past Surgical History, Family History, and Social History were reviewed in [facility] record.  ROS  The following are not active complaints unless bolded sore throat, dysphagia, dental problems, itching, sneezing,  nasal congestion  or excess/  purulent secretions, ear ache,   fever, chills, sweats, unintended wt loss, classically pleuritic or exertional cp, hemoptysis,  orthopnea pnd or leg swelling, presyncope, palpitations, abdominal pain, anorexia, nausea, vomiting, diarrhea  or change in bowel or bladder habits, change in stools or urine, dysuria,hematuria,  rash, arthralgias, visual complaints, headache, numbness, weakness or ataxia or problems with walking or coordination,  change in mood/affect or memory.                        Objective:   Physical Exam  amb wm nad   11/10/2015      177  > 12/08/2015    175  10/26/2015        178  10/26/14 177 lb (80.287 kg)  09/29/14 177 lb 9.6 oz (80.559 kg)  12/18/13 175 lb (79.379 kg)    Vital signs  reviewed    HEENT: nl dentition,  and orophanx. Nl external ear canals without cough reflex - moderate bilateral non-specific turbinate edema     NECK :  without JVD/Nodes/TM/ nl carotid upstrokes bilaterally   LUNGS: no acc muscle use, very minimal insp / exp rhonchi bilaterally    CV:  RRR  no s3 or murmur or increase in P2, no edema   ABD:  soft and nontender with nl excursion in the supine position. No bruits or organomegaly, bowel sounds nl  MS:  warm without deformities, calf tenderness, cyanosis or clubbing  SKIN: warm and dry without lesions    NEURO:  alert, approp, no deficits       CXR PA and Lateral:   12/08/2015 :    I personally reviewed images and agree with radiology impression as follows:    Resolution of pneumonia. No active lung disease. Stable postop changes on the right.       Assessment & Plan:

## 2015-12-08 NOTE — Patient Instructions (Signed)
Please remember to go to the lab department downstairs for your tests - we will call you with the results when they are available.  Be sure to use the flutter valve any time your cough recurs and use mucinex up to 1200 mg every 12 hours as needed and call for appt here if not clearing.

## 2015-12-09 DIAGNOSIS — J189 Pneumonia, unspecified organism: Secondary | ICD-10-CM | POA: Insufficient documentation

## 2015-12-09 LAB — RESPIRATORY ALLERGY PROFILE REGION II ~~LOC~~
Allergen, Cedar tree, t12: <0.1 kU/L
Allergen, D pternoyssinus,d7: <0.1 kU/L
Allergen, Mouse Urine Protein, e78: <0.1 kU/L
Allergen, Oak,t7: <0.1 kU/L
Alternaria Alternata: <0.1 kU/L
Box Elder IgE: <0.1 kU/L
Cat Dander: <0.1 kU/L
Dog Dander: <0.1 kU/L
Elm IgE: <0.1 kU/L
IGE (IMMUNOGLOBULIN E), SERUM: 399 kU/L — AB (ref <115)
Rough Pigweed  IgE: <0.1 kU/L
Sheep Sorrel IgE: <0.1 kU/L

## 2015-12-09 LAB — IGG, IGA, IGM
IgA: 167 mg/dL (ref 81–463)
IgG (Immunoglobin G), Serum: 1052 mg/dL (ref 694–1618)
IgM, Serum: 106 mg/dL (ref 48–271)

## 2015-12-09 NOTE — Assessment & Plan Note (Signed)
Resolved clinically and radiographically, no further f/u needed  rec attention to yearly flu, no further pneumonia vaccinations s/p prevnar 13 2015

## 2015-12-09 NOTE — Assessment & Plan Note (Addendum)
PFT's   02/14/12  FEV1  2.65 (90%) ratio 65 and dlco 82 > 92  CT chest 04/05/2015 1. Interval near complete resolution of the rounded mass like lesion in the lateral RIGHT lower lobe consistent with resolution of rounded atelectasis versus rounded pneumonia. 2. Persistent mild interstitial banding and bronchiectasis in the RIGHT lower lobe at site above inflammation / infection. 3. Stable small scattered pulmonary nodules. 4. Stable postsurgical change in the RIGHT upper lobe >> f/u cxr 10/26/2015 > ok then  - Prevnar 12/09/13 - Flutter valve added 11/10/2015  - alpha one AT screen 12/08/2015 > - Immunoglobulin profile 12/08/2015 >>>    I had an extended final summary discussion with the patient reviewing all relevant studies completed to date and  lasting 15 to 20 minutes of a 25 minute visit on the following issues:    Back to baseline p pna and not enough airflow obst clinically to warrant chronic rx with bronchodilators > best option is keep fltter valve hand, use mucinex max doses prn and f/u here if worse to consider a "prn abx rx" or adding a maint bronchodilator  Each maintenance medication was reviewed in detail including most importantly the difference between maintenance and as needed and under what circumstances the prns are to be used.  Please see instructions for details which were reviewed in writing and the patient given a copy.

## 2015-12-10 LAB — ALPHA-1-ANTITRYPSIN: A-1 Antitrypsin, Ser: 161 mg/dL (ref 83–199)

## 2015-12-13 LAB — ALPHA-1 ANTITRYPSIN PHENOTYPE: A-1 Antitrypsin: 141 mg/dL (ref 83–199)

## 2015-12-14 ENCOUNTER — Telehealth: Payer: Self-pay | Admitting: Internal Medicine

## 2015-12-14 NOTE — Telephone Encounter (Signed)
Called and left a message with pts wife to have him call our office back.

## 2015-12-14 NOTE — Telephone Encounter (Signed)
Patient returned call, [phone]

## 2015-12-14 NOTE — Telephone Encounter (Signed)
Result Note     Call patient : Studies are unremarkable, no change in recs     I spoke with patient about results and he verbalized understanding and had no questions 

## 2015-12-31 ENCOUNTER — Ambulatory Visit (INDEPENDENT_AMBULATORY_CARE_PROVIDER_SITE_OTHER): Payer: Medicare HMO

## 2015-12-31 ENCOUNTER — Ambulatory Visit (INDEPENDENT_AMBULATORY_CARE_PROVIDER_SITE_OTHER): Payer: Medicare HMO | Admitting: Physician Assistant

## 2015-12-31 VITALS — BP 100/64 | HR 69 | Temp 97.7°F | Resp 18 | Ht 72.0 in | Wt 174.8 lb

## 2015-12-31 DIAGNOSIS — S6992XA Unspecified injury of left wrist, hand and finger(s), initial encounter: Secondary | ICD-10-CM | POA: Diagnosis not present

## 2015-12-31 DIAGNOSIS — S5012XA Contusion of left forearm, initial encounter: Secondary | ICD-10-CM | POA: Diagnosis not present

## 2015-12-31 DIAGNOSIS — M79645 Pain in left finger(s): Secondary | ICD-10-CM

## 2015-12-31 DIAGNOSIS — M79632 Pain in left forearm: Secondary | ICD-10-CM

## 2015-12-31 DIAGNOSIS — S40022A Contusion of left upper arm, initial encounter: Secondary | ICD-10-CM

## 2015-12-31 NOTE — Progress Notes (Signed)
[facility] [address], [place] [address] [phone]  Date:  12/31/2015   Name:  [name] [name]   DOB:  Nov 16, 1930   MRN:  [id number]  PCP:  Jenny Reichmann, MD    History of Present Illness:  [name] [name] is a 82 y.o. male patient who presents to [facility] for cc of arm pain.    --he had a chest that he pushed down.  Fell down on arm.  The arm bruised and was blue tinted all over.  The pain has localized to the base of his thumb though he states that the chest never came into contact with that area of his hand much less forearm.  The most contact was on the ulnar are of the forearm near the elbow.  No numbness and tingling.  He has a not at this area, and says it is not tender.  No fevers, no drainage.  Patient Active Problem List   Diagnosis Date Noted  . CAP (community acquired pneumonia) 12/09/2015  . Inguinal hernia 09/29/2014  . Cardiomyopathy ([place]) 01/26/2012  . Obstructive bronchiectasis ([place]) 01/26/2012  . Tricuspid regurgitation 12/06/2011  . Aneurysm of thoracic aorta ([place]) 11/06/2008  . CARDIOVASCULAR STUDIES, ABNORMAL 11/06/2008  . HYPERLIPIDEMIA 11/05/2008  . MITRAL REGURGITATION 11/05/2008  . MITRAL VALVE PROLAPSE 11/05/2008  . BUNDLE BRANCH BLOCK, RIGHT 11/05/2008  . PALPITATIONS 11/05/2008  . PURE HYPERCHOLESTEROLEMIA 09/30/2008    Past Medical History:  Diagnosis Date  . Benign tumor of lung   . BPH (benign prostatic hyperplasia)   . Cardiomyopathy ([place])   . Cataract   . Dilated aortic root (HCC)    Hx of mild  . Hyperlipidemia   . Mitral valve prolapse    irregular heart beat  . Palpitations   . Pure hypercholesterolemia   . RBBB (right bundle branch block)     Past Surgical History:  Procedure Laterality Date  . COSMETIC SURGERY    . Excision on neck     Excision of 2 cm soft tissue mass of left neck  . EYE SURGERY    . HERNIA REPAIR    . Resection of Lung     Hx of, right lung secondary to benign tumor  . SHOULDER  ARTHROSCOPY     Lt  . testicular hernia    . VASECTOMY      Social History  Substance Use Topics  . Smoking status: Former Research scientist (life sciences)  . Smokeless tobacco: Never Used     Comment: quit 80's  . Alcohol use No    Family History  Problem Relation Age of Onset  . Aortic aneurysm Father   . Heart disease Father   . Hypertension Father   . Hyperlipidemia Father   . Colon cancer Sister 106  . Cancer Sister   . Cancer Sister   . Hyperlipidemia    . Esophageal cancer Neg Hx   . Stomach cancer Neg Hx   . Rectal cancer Neg Hx     No Known Allergies  Medication list has been reviewed and updated.  Current Outpatient Prescriptions on File Prior to Visit  Medication Sig Dispense Refill  . aspirin 81 MG EC tablet Take 81 mg by mouth daily.      Marland Kitchen atorvastatin (LIPITOR) 40 MG tablet Take 1 tablet (40 mg total) by mouth daily at 6 PM. 30 tablet 11  . Calcium Carbonate-Vitamin D (CALCIUM 600/VITAMIN D) 600-400 MG-UNIT per tablet Take 1 tablet by mouth 2 (  two) times daily.      . carboxymethylcellulose (RETAINE CMC) 0.5 % SOLN 1 drop 3 (three) times daily as needed.    Marland Kitchen co-enzyme Q-10 30 MG capsule Take 30 mg by mouth 2 (two) times daily.      . fish oil-omega-3 fatty acids 1000 MG capsule Take 1 g by mouth daily. Nordic naturals omega 3 TID    . fluticasone (FLONASE) 50 MCG/ACT nasal spray USE 2 SPRAYS IN EACH NOSTRIL DAILY. 16 g 11  . metoprolol succinate (TOPROL-XL) 25 MG 24 hr tablet Take 0.5 tablets (12.5 mg total) by mouth daily. 30 tablet 11  . Multiple Vitamins-Minerals (VISION-VITE PRESERVE PO) Take by mouth. 2 per day    . NON FORMULARY HYDROCTISONE BUT 0.1% CREAM 45GM PRN    . Polyethyl Glycol-Propyl Glycol (SYSTANE OP) Apply to eye as needed. Reported on 11/05/2015    . Probiotic Product (PROBIOTIC DAILY PO) Take by mouth.    . TURMERIC PO Take 2,000 mg by mouth daily.    Marland Kitchen Respiratory Therapy Supplies (FLUTTER) DEVI Use as directed (Patient not taking: Reported on 12/31/2015) 1  each 0   No current facility-administered medications on file prior to visit.     ROS ROS otherwise unremarkable unless listed above.    Physical Examination: BP 100/64 (BP Location: Left Arm, Patient Position: Sitting, Cuff Size: Large)   Pulse 69   Temp 97.7 F (36.5 C) (Oral)   Resp 18   Ht 6' (1.829 m)   Wt 174 lb 12.8 oz (79.3 kg)   SpO2 95%   BMI 23.71 kg/m  Ideal Body Weight: Weight in (lb) to have BMI = 25: 183.9  Physical Exam  Constitutional: He is oriented to person, place, and time. He appears well-developed and well-nourished. No distress.  HENT:  Head: Normocephalic and atraumatic.  Eyes: Conjunctivae and EOM are normal. Pupils are equal, round, and reactive to light.  Cardiovascular: Normal rate.   Pulmonary/Chest: Effort normal. No respiratory distress.  Musculoskeletal:  Dorsal aspect of the ulnar position of proximal forearm with ecchymotic nontender 2cm nodule.  No warmth or purulent fluid to the area.  Normal rom of the wrist without tenderness to the metacarpals upon palpation or passive rom.  Normal capillary refill.   Neurological: He is alert and oriented to person, place, and time.  Skin: Skin is warm and dry. He is not diaphoretic.  Psychiatric: He has a normal mood and affect. His behavior is normal.    Dg Chest 2 View  Result Date: 12/08/2015 CLINICAL DATA:  Followup pneumonia EXAM: CHEST  2 VIEW COMPARISON:  Chest x-ray of 11/08/2015 FINDINGS: The airspace disease noted previously at the right lung base has resolved. Post operative changes on the right remain with some scarring at the right lung base. The left lung is clear. Mediastinal and hilar contours are unremarkable. Surgical clips overlie the right hilum with surgical sutures present as well. The heart is within normal limits in size. There are degenerative changes throughout the thoracic spine. IMPRESSION: Resolution of pneumonia. No active lung disease. Stable postop changes on the right.  Electronically Signed   By: Ivar Drape M.D.   On: 12/08/2015 14:52   Dg Forearm Left  Result Date: 12/31/2015 CLINICAL DATA:  Status post fall 1 week ago with a left forearm injury. Continued pain, swelling and hematoma. Initial encounter. EXAM: LEFT FOREARM - 2 VIEW COMPARISON:  None. FINDINGS: No acute bony or joint abnormality is identified. Focal soft tissue swelling is  seen along the dorsal aspect of the left forearm on the radial side consistent with reported hematoma. No elbow joint effusion is identified. No radiopaque foreign body is seen. IMPRESSION: Findings consistent with history of left forearm hematoma. Negative for acute bony or joint abnormality. Electronically Signed   By: Inge Rise M.D.   On: 12/31/2015 10:10  Dg Finger Thumb Left  Result Date: 12/31/2015 CLINICAL DATA:  Status post fall 1 week ago with a left thumb injury. Continued pain. Initial encounter. EXAM: LEFT THUMB 2+V COMPARISON:  None. FINDINGS: No acute bony or joint abnormality is seen. Mild degenerative change about the first [facility] and scaphoid trapezium trapezoid joints is noted. Degenerative change is also seen about the DIP joint of the left index finger. Soft tissues are unremarkable. Small exostosis off the distal metaphysis of the first metacarpal is incidentally noted. IMPRESSION: No acute abnormality. Electronically Signed   By: Inge Rise M.D.   On: 12/31/2015 10:12    Assessment and Plan: [name] [name] is a 82 y.o. male who is here today for left arm pain, and finger pain.   This appears to be healing.  I have advised that he use warm compresses on the forearm as well as compression so the body can reabsorb this.  Pain of left forearm - Plan: DG Forearm Left, DG Finger Thumb Left, Care order/instruction:  Hematoma of arm, left, initial encounter - Plan: Care order/instruction:  Ivar Drape, PA-C [facility] 12/31/2015 9:27 AM

## 2015-12-31 NOTE — Patient Instructions (Addendum)
     IF you received an x-ray today, you will receive an invoice from [facility]. Please contact [facility] at [phone] with questions or concerns regarding your invoice.   IF you received labwork today, you will receive an invoice from [employer]. Please contact [facility] at [phone] with questions or concerns regarding your invoice.   Our billing staff will not be able to assist you with questions regarding bills from these companies.  You will be contacted with the lab results as soon as they are available. The fastest way to get your results is to activate your My Chart account. Instructions are located on the last page of this paperwork. If you have not heard from [place] regarding the results in 2 weeks, please contact this office.    Please apply warm compresses to this are of your forearm.  Place a compression bandage, which we demonstrated in the office.  Warm compress 3 times per day for 15 minutes.  If this is not getting any smaller in 10 days, please return.   You can take tylenol for the thumb pain.  There is some arthritis seen at this thumb, and tylenol would be best.  Please return if the pain is worse to you.

## 2016-02-01 DIAGNOSIS — R69 Illness, unspecified: Secondary | ICD-10-CM | POA: Diagnosis not present

## 2016-02-21 DIAGNOSIS — Z85828 Personal history of other malignant neoplasm of skin: Secondary | ICD-10-CM | POA: Diagnosis not present

## 2016-02-21 DIAGNOSIS — Z08 Encounter for follow-up examination after completed treatment for malignant neoplasm: Secondary | ICD-10-CM | POA: Diagnosis not present

## 2016-02-21 DIAGNOSIS — L570 Actinic keratosis: Secondary | ICD-10-CM | POA: Diagnosis not present

## 2016-02-21 DIAGNOSIS — X32XXXD Exposure to sunlight, subsequent encounter: Secondary | ICD-10-CM | POA: Diagnosis not present

## 2016-02-21 DIAGNOSIS — D225 Melanocytic nevi of trunk: Secondary | ICD-10-CM | POA: Diagnosis not present

## 2016-02-23 DIAGNOSIS — H2513 Age-related nuclear cataract, bilateral: Secondary | ICD-10-CM | POA: Diagnosis not present

## 2016-02-23 DIAGNOSIS — H25013 Cortical age-related cataract, bilateral: Secondary | ICD-10-CM | POA: Diagnosis not present

## 2016-08-09 DIAGNOSIS — R69 Illness, unspecified: Secondary | ICD-10-CM | POA: Diagnosis not present

## 2016-08-28 ENCOUNTER — Encounter: Payer: Self-pay | Admitting: Family Medicine

## 2016-08-28 ENCOUNTER — Ambulatory Visit (INDEPENDENT_AMBULATORY_CARE_PROVIDER_SITE_OTHER): Payer: Medicare HMO | Admitting: Family Medicine

## 2016-08-28 VITALS — BP 106/68 | HR 78 | Temp 98.1°F | Resp 17 | Ht 72.0 in | Wt 182.1 lb

## 2016-08-28 DIAGNOSIS — I429 Cardiomyopathy, unspecified: Secondary | ICD-10-CM

## 2016-08-28 DIAGNOSIS — J301 Allergic rhinitis due to pollen: Secondary | ICD-10-CM | POA: Diagnosis not present

## 2016-08-28 DIAGNOSIS — I712 Thoracic aortic aneurysm, without rupture, unspecified: Secondary | ICD-10-CM

## 2016-08-28 DIAGNOSIS — E785 Hyperlipidemia, unspecified: Secondary | ICD-10-CM

## 2016-08-28 DIAGNOSIS — I10 Essential (primary) hypertension: Secondary | ICD-10-CM | POA: Diagnosis not present

## 2016-08-28 DIAGNOSIS — Z23 Encounter for immunization: Secondary | ICD-10-CM | POA: Diagnosis not present

## 2016-08-28 LAB — BASIC METABOLIC PANEL
BUN: 17 mg/dL (ref 6–23)
CALCIUM: 9.7 mg/dL (ref 8.4–10.5)
CO2: 33 meq/L — AB (ref 19–32)
Chloride: 102 mEq/L (ref 96–112)
Creatinine, Ser: 0.83 mg/dL (ref 0.40–1.50)
GFR: 93.87 mL/min (ref >60.00)
GLUCOSE: 99 mg/dL (ref 70–99)
Potassium: 4.7 mEq/L (ref 3.5–5.1)
SODIUM: 140 meq/L (ref 135–145)

## 2016-08-28 LAB — CBC WITH DIFFERENTIAL/PLATELET
BASOS ABS: 0.1 10*3/uL (ref 0.0–0.1)
Basophils Relative: 1.1 % (ref 0.0–3.0)
EOS ABS: 0.2 10*3/uL (ref 0.0–0.7)
Eosinophils Relative: 2.8 % (ref 0.0–5.0)
HCT: 47.4 % (ref 39.0–52.0)
Hemoglobin: 15.8 g/dL (ref 13.0–17.0)
LYMPHS ABS: 1.2 10*3/uL (ref 0.7–4.0)
LYMPHS PCT: 22.2 % (ref 12.0–46.0)
MCHC: 33.4 g/dL (ref 30.0–36.0)
MCV: 92 fl (ref 78.0–100.0)
Monocytes Absolute: 0.5 10*3/uL (ref 0.1–1.0)
Monocytes Relative: 9.6 % (ref 3.0–12.0)
NEUTROS ABS: 3.6 10*3/uL (ref 1.4–7.7)
Neutrophils Relative %: 64.3 % (ref 43.0–77.0)
PLATELETS: 183 10*3/uL (ref 150.0–400.0)
RBC: 5.15 Mil/uL (ref 4.22–5.81)
RDW: 12.7 % (ref 11.5–15.5)
WBC: 5.5 10*3/uL (ref 4.0–10.5)

## 2016-08-28 LAB — LIPID PANEL
CHOL/HDL RATIO: 3
Cholesterol: 146 mg/dL (ref 0–200)
HDL: 47.1 mg/dL (ref >39.00)
LDL CALC: 81 mg/dL (ref 0–99)
NonHDL: 98.7
Triglycerides: 91 mg/dL (ref 0.0–149.0)
VLDL: 18.2 mg/dL (ref 0.0–40.0)

## 2016-08-28 LAB — HEPATIC FUNCTION PANEL
ALK PHOS: 66 U/L (ref 39–117)
ALT: 14 U/L (ref 0–53)
AST: 16 U/L (ref 0–37)
Albumin: 4.4 g/dL (ref 3.5–5.2)
BILIRUBIN DIRECT: 0.2 mg/dL (ref 0.0–0.3)
TOTAL PROTEIN: 7 g/dL (ref 6.0–8.3)
Total Bilirubin: 0.9 mg/dL (ref 0.2–1.2)

## 2016-08-28 LAB — TSH: TSH: 2.08 u[IU]/mL (ref 0.35–4.50)

## 2016-08-28 MED ORDER — METOPROLOL SUCCINATE ER 25 MG PO TB24: 12.5000 mg | ORAL_TABLET | Freq: Every day | ORAL | 1 refills | Status: DC

## 2016-08-28 MED ORDER — FLUTICASONE PROPIONATE 50 MCG/ACT NA SUSP: NASAL | 1 refills | Status: AC

## 2016-08-28 MED ORDER — ATORVASTATIN CALCIUM 40 MG PO TABS: 40.0000 mg | ORAL_TABLET | Freq: Every day | ORAL | 1 refills | Status: DC

## 2016-08-28 NOTE — Assessment & Plan Note (Signed)
New to provider, ongoing for pt.  Refill of Flonase provided.  Will follow.

## 2016-08-28 NOTE — Assessment & Plan Note (Signed)
New to provider, ongoing for pt.  Tolerating statin w/o difficulty.  Check labs.  Adjust meds prn  

## 2016-08-28 NOTE — Assessment & Plan Note (Signed)
New to provider, ongoing for pt.  Reviewed ECHO from 2015- EF WNL.  Following w/ Dr Stanford Breed.  On beta blocker daily.  Will follow along and assist as able

## 2016-08-28 NOTE — Progress Notes (Signed)
   Subjective:    Patient ID: [name] [name], male    DOB: 20-May-1931, 83 y.o.   MRN: [id number]  HPI New to establish.  Previous MD- Daub  [facility]  Hyperlipidemia- chronic problem, on Fish Oil, Lipitor daily.  No CP, SOB, abd pain, N/V.  Walking regularly.  Cardiomyopathy- chronic problem.  Following w/ Dr Stanford Breed.  Last ECHO 2015 showed normal EF.  On Metoprolol 1/2 tab daily.  On ASA daily.  No CP, palpitations, SOB.  Thoracic aorta aneurysm- chronic problem, following w/ Dr Stanford Breed.  Denies CP  Health Maintenance- UTD on colonoscopy, will not need another.  Due for repeat Pneumovax today.  Social- living at home w/ wife.  Son lives locally.     Review of Systems For ROS see HPI     Objective:   Physical Exam  Constitutional: He is oriented to person, place, and time. He appears well-developed and well-nourished. No distress.  HENT:  Head: Normocephalic and atraumatic.  Eyes: Conjunctivae and EOM are normal. Pupils are equal, round, and reactive to light.  Neck: Normal range of motion. Neck supple. No thyromegaly present.  Cardiovascular: Normal rate, regular rhythm, normal heart sounds and intact distal pulses.   No murmur heard. Muffled but normal heart sounds  Pulmonary/Chest: Effort normal and breath sounds normal. No respiratory distress.  Abdominal: Soft. Bowel sounds are normal. He exhibits no distension.  Musculoskeletal: He exhibits no edema.  Lymphadenopathy:    He has no cervical adenopathy.  Neurological: He is alert and oriented to person, place, and time. No cranial nerve deficit.  Skin: Skin is warm and dry.  Psychiatric: He has a normal mood and affect. His behavior is normal.  Vitals reviewed.         Assessment & Plan:

## 2016-08-28 NOTE — Assessment & Plan Note (Signed)
Chronic problem.  Asymptomatic.  Following w/ Dr Stanford Breed.  On ASA

## 2016-08-28 NOTE — Patient Instructions (Signed)
Schedule your complete physical in 6 months and your Annual Wellness Visit w/ Maudie Mercury around the same time [facility] notify you of your lab results and make any changes if needed Please have the [place] send me copies of their notes/reports/labs/etc Continue to work on healthy diet and regular exercise- you look great! Call with any questions or concerns Welcome!  We're glad to have you!!!

## 2016-08-28 NOTE — Progress Notes (Signed)
Pre visit review using our clinic review tool, if applicable. No additional management support is needed unless otherwise documented below in the visit note. 

## 2016-09-06 ENCOUNTER — Ambulatory Visit: Payer: Medicare HMO | Admitting: Family Medicine

## 2016-09-18 DIAGNOSIS — H01022 Squamous blepharitis right lower eyelid: Secondary | ICD-10-CM | POA: Diagnosis not present

## 2016-09-18 DIAGNOSIS — H01024 Squamous blepharitis left upper eyelid: Secondary | ICD-10-CM | POA: Diagnosis not present

## 2016-09-18 DIAGNOSIS — H2513 Age-related nuclear cataract, bilateral: Secondary | ICD-10-CM | POA: Diagnosis not present

## 2016-09-18 DIAGNOSIS — H01021 Squamous blepharitis right upper eyelid: Secondary | ICD-10-CM | POA: Diagnosis not present

## 2016-09-18 DIAGNOSIS — H25013 Cortical age-related cataract, bilateral: Secondary | ICD-10-CM | POA: Diagnosis not present

## 2016-09-18 DIAGNOSIS — H01025 Squamous blepharitis left lower eyelid: Secondary | ICD-10-CM | POA: Diagnosis not present

## 2016-10-06 IMAGING — CT CT CHEST W/O CM
1 of 2 series · 14 of 32 positions shown, 18 images · non-contrast
Comparison: 07/15/2012 and 12/13/2011.

CLINICAL DATA: F/u pulmonary lung nodule dx'd 7371 Hx of rt lung
benign tumor with surg No hx ca Prev ct [DATE] No complaints Ex
smoker quit 30- 40 years ago

EXAM:
CT CHEST WITHOUT CONTRAST
TECHNIQUE: Multidetector CT imaging of the chest was performed following the
standard protocol without IV contrast.

[Series 6: chest super d · axial · 0.77mm/px · z∈[-363,-60]mm · 14 of 335 slices shown, 18 images]
[im 16/335  mediastinal]
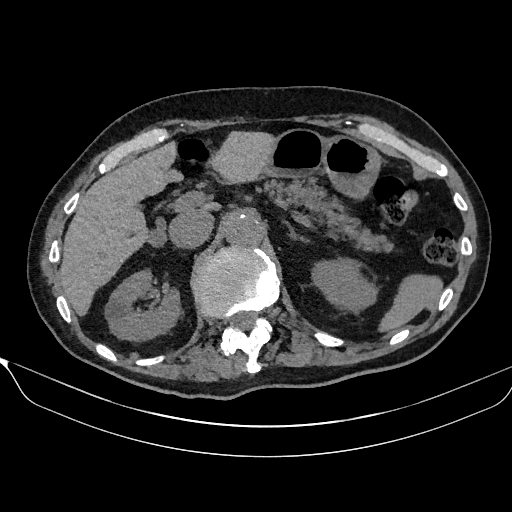
[im 16/335  lung]
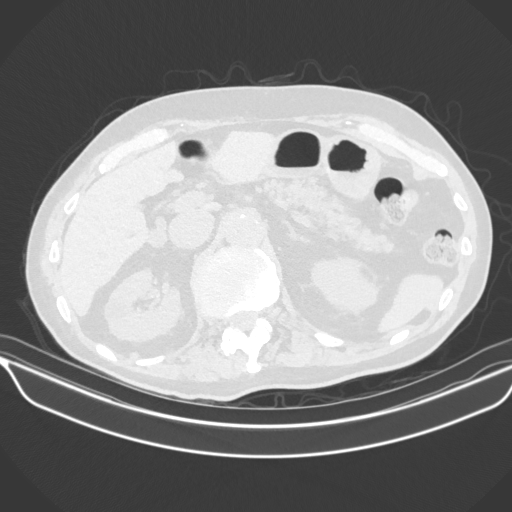
[im 46/335  lung]
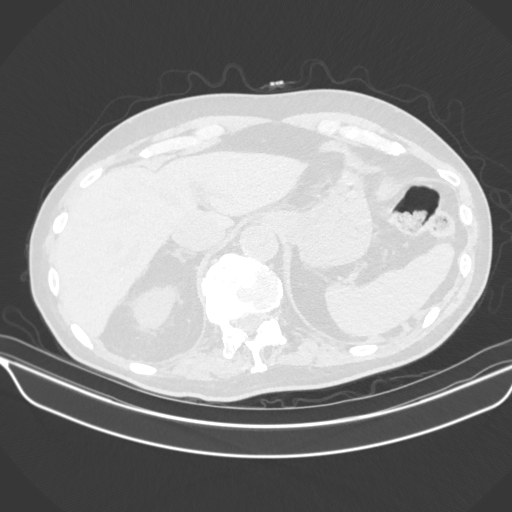
[im 76/335  lung]
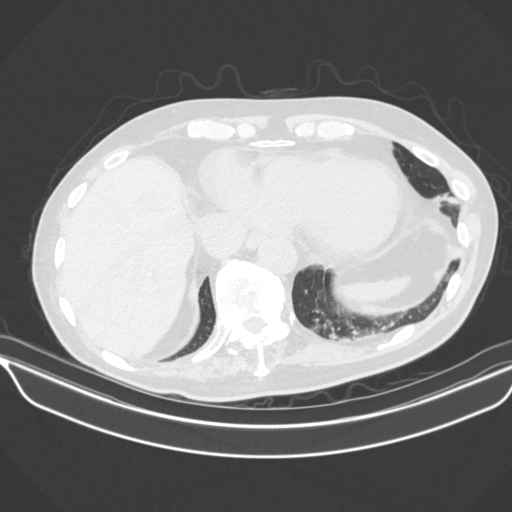
[im 92/335  lung]
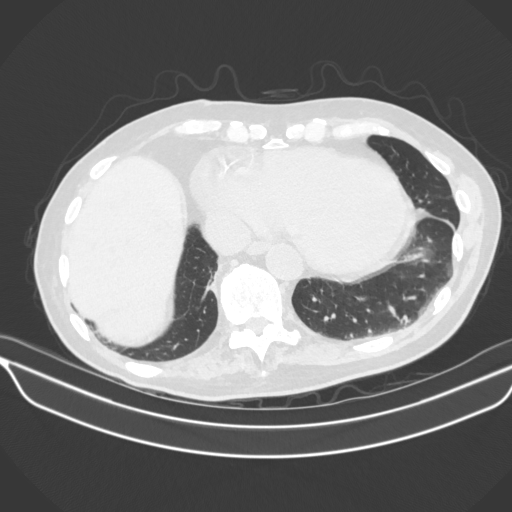
[im 107/335  mediastinal]
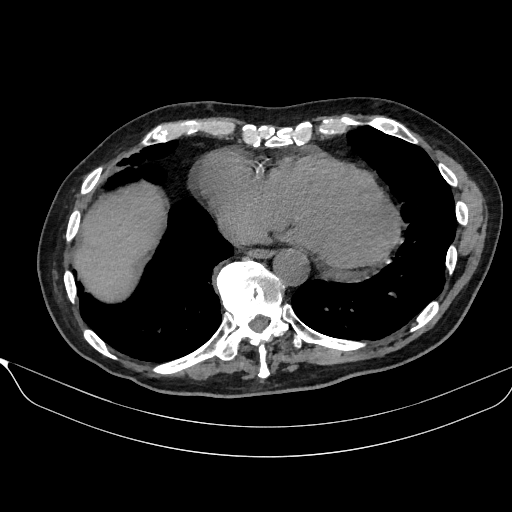
[im 107/335  lung]
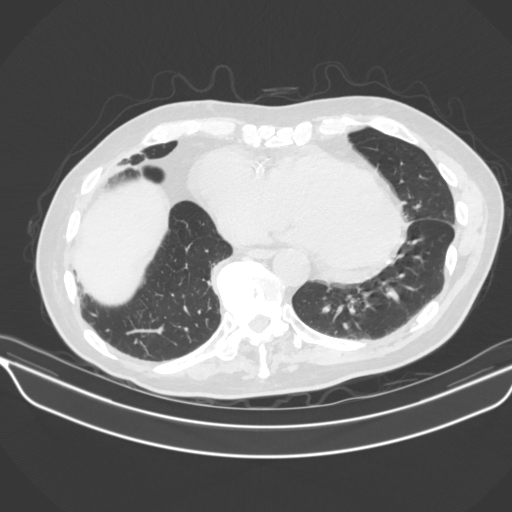
[im 137/335  lung]
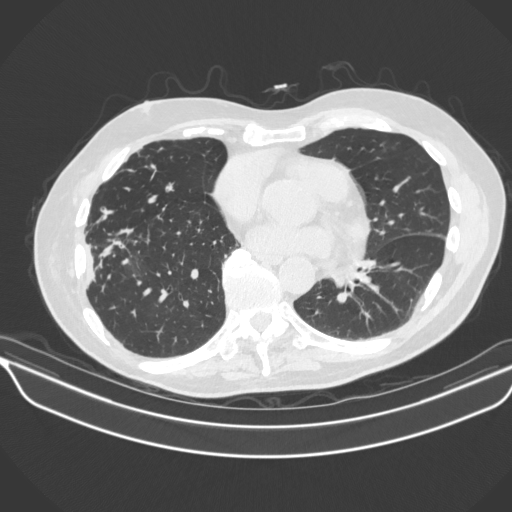
[im 158/335  lung]
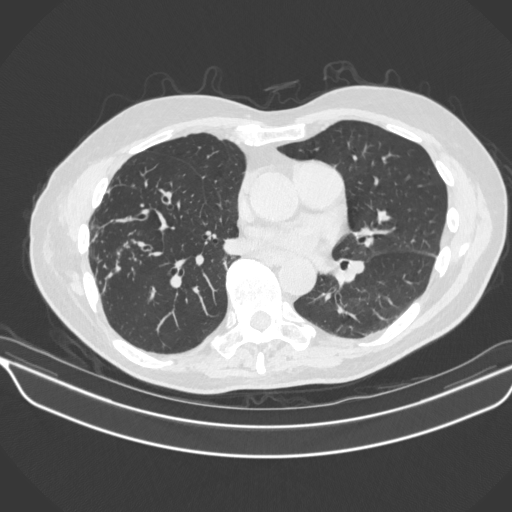
[im 168/335  lung]
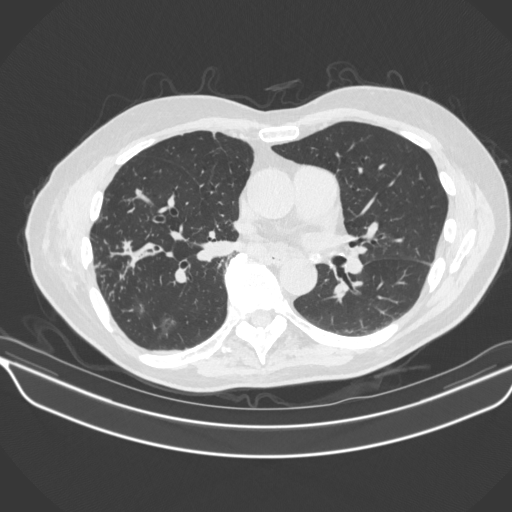
[im 198/335  mediastinal]
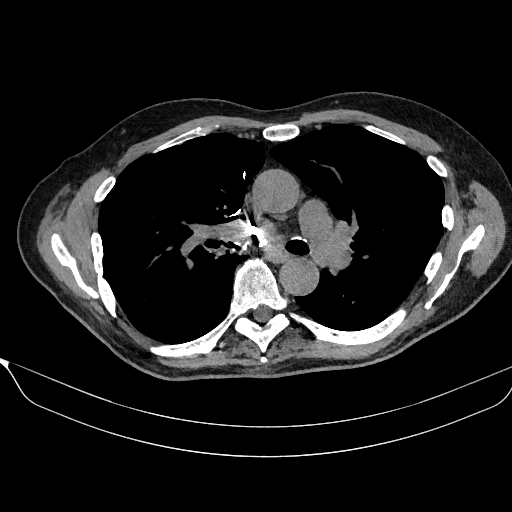
[im 198/335  lung]
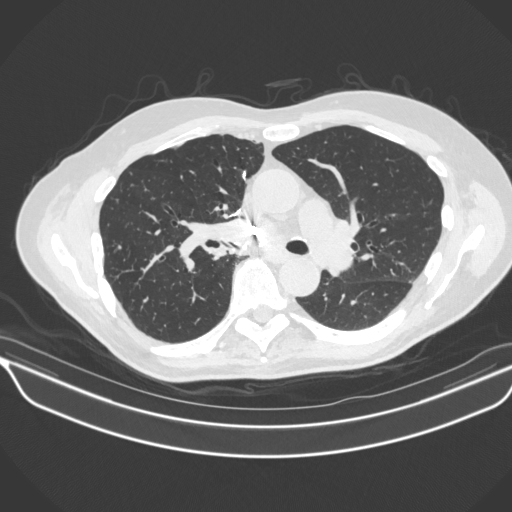
[im 228/335  lung]
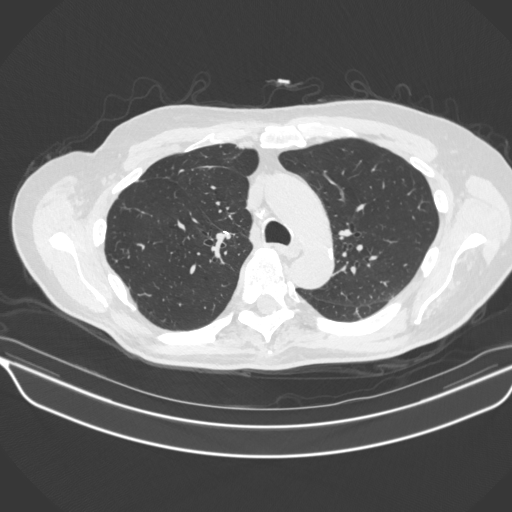
[im 251/335  lung]
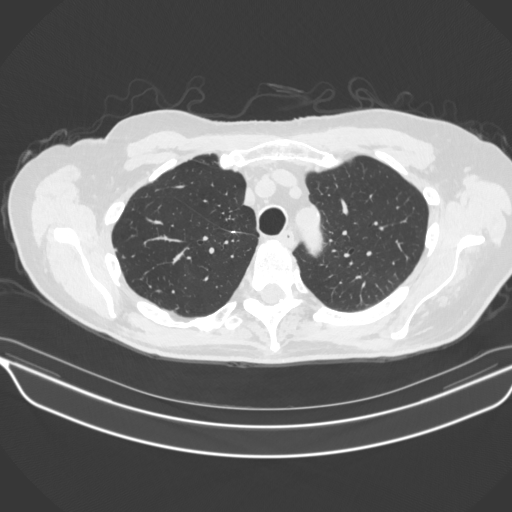
[im 259/335  lung]
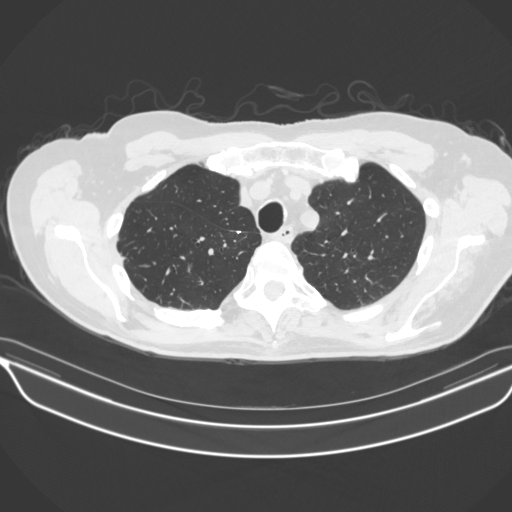
[im 289/335  mediastinal]
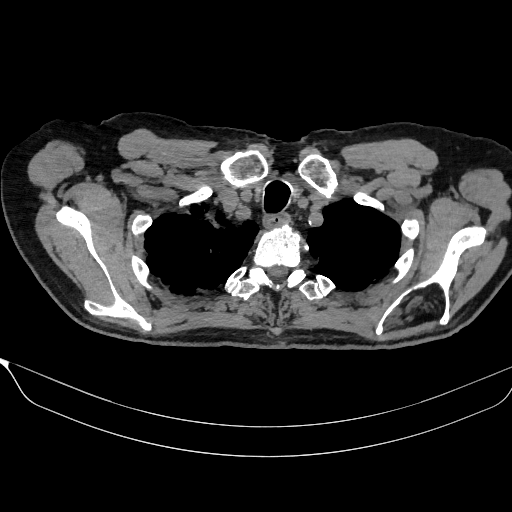
[im 289/335  lung]
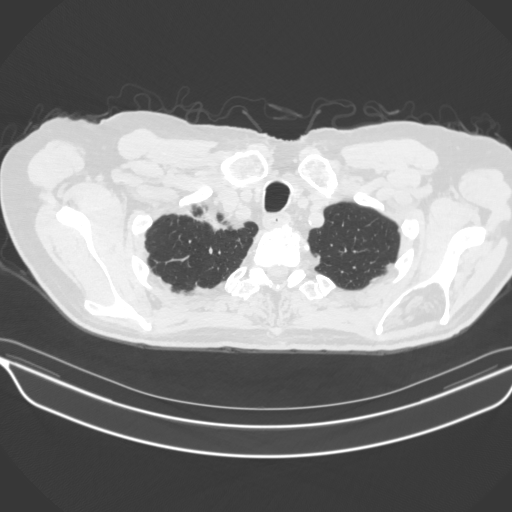
[im 319/335  lung]
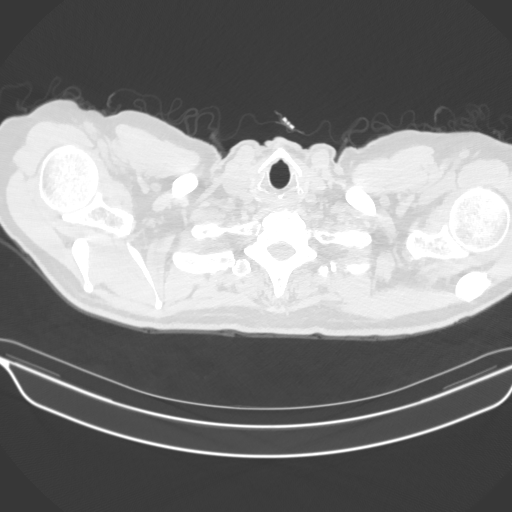

[14 of 32 positions shown; findings below may reference images not displayed]

FINDINGS: Thoracic inlet:  No masses or adenopathy.

Mediastinum and hila: Surgical clips lie along the anterior superior
right hilum. There are calcified mediastinal lymph nodes. No
mediastinal or hilar masses or pathologically enlarged lymph nodes.
No change from the most recent prior CT.

Lungs and pleural: In the right lateral lung base, in the right
lower lobe, most pleural-based ill-defined focal opacity measuring 3
cm x 2.8 cm. Only mild pleural thickening and associated reticular
opacity was noted in this location on the prior CT. There is also
associated peribronchovascular reticular type opacity in bronchial
wall thickening, noted in the lateral right lower lobe. Most of this
is new from the prior study. There is 1 small discrete 3 mm nodule
in the right lower lobe, stable from the prior CT. There is a
peripheral pleural parenchymal scarring in the upper lobes mostly at
the apex are stable. There is a small lobulated nodule in the
expanded right lower lobe near the apex also stable. No evidence of
a new right lung nodule.

Mild reticular opacity noted in the left lower lobe at the posterior
lateral lung base and in the left upper lobe lingula at the lateral
lung base, increased from the prior study. There is a small area of
reticular nodular density in the left upper lobe near the left heart
border that is new. No discrete left lung nodule.

No pleural effusion or pneumothorax. No evidence of pulmonary edema.

Limited upper abdomen: Low-density renal lesions consistent with
cysts, 1 on the right larger than on the prior CT. No other
significant finding.

Musculoskeletal: Bony thorax is demineralized. There are
degenerative changes noted throughout the visualized spine. No
osteoblastic or osteolytic lesions.
IMPRESSION: 1. There are changes from the most recent prior CT. In the right
lower lobe there is a focal area of opacity that is pleural-based.
This lies laterally measuring 3 cm x 2.8 cm in greatest transverse
dimension. This is likely rounded atelectasis. It could be
inflammatory or infectious in origin. Neoplastic disease is felt
unlikely, but not excluded. There are coarse reticular,
peribronchovascular opacities as well of bronchial wall thickening
in the lateral right lower lobe, which supports an inflammatory or
infectious etiology. Small new area of reticular nodular opacity in
the left upper lobe, which also has an inflammatory or infectious
appearance.
2. There are several right lung nodules which are stable from the
prior CT. No new lung nodules.
3. Stable calcified lymph nodes along the mediastinum consistent
with healed granulomatous disease. Stable postsurgical changes along
the superior right hilum.

## 2016-11-30 DIAGNOSIS — Z6823 Body mass index (BMI) 23.0-23.9, adult: Secondary | ICD-10-CM | POA: Diagnosis not present

## 2016-11-30 DIAGNOSIS — I499 Cardiac arrhythmia, unspecified: Secondary | ICD-10-CM | POA: Diagnosis not present

## 2016-11-30 DIAGNOSIS — H04129 Dry eye syndrome of unspecified lacrimal gland: Secondary | ICD-10-CM | POA: Diagnosis not present

## 2016-11-30 DIAGNOSIS — E785 Hyperlipidemia, unspecified: Secondary | ICD-10-CM | POA: Diagnosis not present

## 2016-11-30 DIAGNOSIS — J309 Allergic rhinitis, unspecified: Secondary | ICD-10-CM | POA: Diagnosis not present

## 2016-11-30 DIAGNOSIS — Z0000 Encounter for general adult medical examination without abnormal findings: Secondary | ICD-10-CM | POA: Diagnosis not present

## 2016-12-01 NOTE — Progress Notes (Signed)
HPI: FU palpitations, cardiomyopathy and family history of abdominal aortic aneurysm. He had an abdominal ultrasound in July 2008 that showed no abdominal aortic aneurysm. Carotid dopplers were performed on January 01, 2008. He was found to have normal carotid arteries bilaterally. Myoview in September of 2013. Ejection fraction was 53% and the perfusion was normal. Echocardiogram repeated July 2015 and showed normal LV function and mildly dilated ascending aorta. Since I last saw him the patient has dyspnea with more extreme activities but not with routine activities. It is relieved with rest. It is not associated with chest pain. There is no orthopnea, PND or pedal edema. There is no syncope or palpitations. There is no exertional chest pain.   Current Outpatient Prescriptions  Medication Sig Dispense Refill  . aspirin 81 MG EC tablet Take 81 mg by mouth daily.      Marland Kitchen atorvastatin (LIPITOR) 40 MG tablet Take 1 tablet (40 mg total) by mouth daily at 6 PM. 90 tablet 1  . Calcium Carbonate-Vitamin D (CALCIUM 600/VITAMIN D) 600-400 MG-UNIT per tablet Take 1 tablet by mouth 2 (two) times daily.      . carboxymethylcellulose (RETAINE CMC) 0.5 % SOLN 1 drop 3 (three) times daily as needed.    Marland Kitchen co-enzyme Q-10 30 MG capsule Take 30 mg by mouth 2 (two) times daily.      . fish oil-omega-3 fatty acids 1000 MG capsule Take 1 g by mouth daily. Nordic naturals omega 3 TID    . fluticasone (FLONASE) 50 MCG/ACT nasal spray USE 2 SPRAYS IN EACH NOSTRIL DAILY. 48 g 1  . metoprolol succinate (TOPROL-XL) 25 MG 24 hr tablet Take 0.5 tablets (12.5 mg total) by mouth daily. 45 tablet 1  . Multiple Vitamins-Minerals (VISION-VITE PRESERVE PO) Take by mouth. 2 per day    . NON FORMULARY HYDROCTISONE BUT 0.1% CREAM 45GM PRN    . Probiotic Product (PROBIOTIC DAILY PO) Take by mouth.    . TURMERIC PO Take 2,000 mg by mouth daily.     No current facility-administered medications for this visit.      Past Medical  History:  Diagnosis Date  . Benign tumor of lung   . BPH (benign prostatic hyperplasia)   . Cardiomyopathy ([place])   . Cataract   . Dilated aortic root (HCC)    Hx of mild  . Hyperlipidemia   . Mitral valve prolapse    irregular heart beat  . Palpitations   . Pure hypercholesterolemia   . RBBB (right bundle branch block)     Past Surgical History:  Procedure Laterality Date  . COSMETIC SURGERY    . Excision on neck     Excision of 2 cm soft tissue mass of left neck  . EYE SURGERY    . HERNIA REPAIR    . Resection of Lung     Hx of, right lung secondary to benign tumor  . SHOULDER ARTHROSCOPY     Lt  . testicular hernia    . VASECTOMY      Social History   Social History  . Marital status: Married    Spouse name: N/A  . Number of children: N/A  . Years of education: N/A   Occupational History  . [place] History Main Topics  . Smoking status: Former Research scientist (life sciences)  . Smokeless tobacco: Never Used     Comment: quit 80's  . Alcohol use No  . Drug use: No  . Sexual activity:  No   Other Topics Concern  . Not on file   Social History Narrative   Exercise 20-30 min walks. Patient is married. Education: [employer].     Family History  Problem Relation Age of Onset  . Aortic aneurysm Father   . Heart disease Father   . Hypertension Father   . Hyperlipidemia Father   . Colon cancer Sister 33  . Cancer Sister   . Cancer Sister   . Hyperlipidemia Unknown   . Esophageal cancer Neg Hx   . Stomach cancer Neg Hx   . Rectal cancer Neg Hx     ROS: no fevers or chills, productive cough, hemoptysis, dysphasia, odynophagia, melena, hematochezia, dysuria, hematuria, rash, seizure activity, orthopnea, PND, pedal edema, claudication. Remaining systems are negative.  Physical Exam: Well-developed well-nourished in no acute distress.  Skin is warm and dry.  HEENT is normal.  Neck is supple.  Chest is clear to auscultation with normal expansion.  Cardiovascular exam is  regular rate and rhythm.  Abdominal exam nontender or distended. No masses palpated. Positive bruit Extremities show no edema. neuro grossly intact  ECG- sinus rhythm with occasional PACs. Right bundle branch block. personally reviewed  [name]  1 Cardiomyopathy-LV function normalized on most recent echocardiogram. Continue present dose of beta blocker.  2 history of mildly dilated aortic root-we will plan to repeat echocardiogram.  3 palpitations-continue beta blocker. Symptoms are controlled at present.  4 hyperlipidemia-continue statin. Lipids and liver are monitored by primary care.  5 Bruit-schedule abdominal ultrasound to exclude aneurysm.   Kirk Ruths, MD

## 2016-12-15 ENCOUNTER — Encounter: Payer: Self-pay | Admitting: Cardiology

## 2016-12-15 ENCOUNTER — Ambulatory Visit (INDEPENDENT_AMBULATORY_CARE_PROVIDER_SITE_OTHER): Payer: Medicare HMO | Admitting: Cardiology

## 2016-12-15 VITALS — BP 114/72 | HR 56 | Ht 73.0 in | Wt 177.0 lb

## 2016-12-15 DIAGNOSIS — R0989 Other specified symptoms and signs involving the circulatory and respiratory systems: Secondary | ICD-10-CM

## 2016-12-15 DIAGNOSIS — R002 Palpitations: Secondary | ICD-10-CM | POA: Diagnosis not present

## 2016-12-15 DIAGNOSIS — I77810 Thoracic aortic ectasia: Secondary | ICD-10-CM | POA: Diagnosis not present

## 2016-12-15 NOTE — Patient Instructions (Signed)
Medication Instructions:   NO CHANGE  Testing/Procedures:  Your physician has requested that you have an echocardiogram. Echocardiography is a painless test that uses sound waves to create images of your heart. It provides your doctor with information about the size and shape of your heart and how well your heart's chambers and valves are working. This procedure takes approximately one hour. There are no restrictions for this procedure.   Your physician has requested that you have an abdominal aorta duplex. During this test, an ultrasound is used to evaluate the aorta. Allow 30 minutes for this exam. Do not eat after midnight the day before and avoid carbonated beverages   Follow-Up:  Your physician wants you to follow-up in: ONE YEAR WITH DR CRENSHAW You will receive a reminder letter in the mail two months in advance. If you don't receive a letter, please call our office to schedule the follow-up appointment.   If you need a refill on your cardiac medications before your next appointment, please call your pharmacy.  

## 2016-12-25 ENCOUNTER — Ambulatory Visit (HOSPITAL_COMMUNITY): Payer: Medicare HMO | Attending: Cardiovascular Disease

## 2016-12-25 ENCOUNTER — Other Ambulatory Visit: Payer: Self-pay

## 2016-12-25 DIAGNOSIS — I313 Pericardial effusion (noninflammatory): Secondary | ICD-10-CM | POA: Diagnosis not present

## 2016-12-25 DIAGNOSIS — I77810 Thoracic aortic ectasia: Secondary | ICD-10-CM | POA: Insufficient documentation

## 2016-12-26 ENCOUNTER — Ambulatory Visit (HOSPITAL_COMMUNITY)
Admission: RE | Admit: 2016-12-26 | Discharge: 2016-12-26 | Disposition: A | Discharge status: Home / Self Care | Payer: Medicare HMO | Source: Ambulatory Visit | Attending: Internal Medicine | Admitting: Internal Medicine

## 2016-12-26 DIAGNOSIS — R0989 Other specified symptoms and signs involving the circulatory and respiratory systems: Secondary | ICD-10-CM | POA: Insufficient documentation

## 2016-12-26 DIAGNOSIS — Z136 Encounter for screening for cardiovascular disorders: Secondary | ICD-10-CM | POA: Diagnosis not present

## 2016-12-26 DIAGNOSIS — I700 Atherosclerosis of aorta: Secondary | ICD-10-CM | POA: Insufficient documentation

## 2016-12-26 DIAGNOSIS — Z8489 Family history of other specified conditions: Secondary | ICD-10-CM | POA: Diagnosis not present

## 2016-12-26 DIAGNOSIS — I708 Atherosclerosis of other arteries: Secondary | ICD-10-CM | POA: Insufficient documentation

## 2017-02-01 DIAGNOSIS — R69 Illness, unspecified: Secondary | ICD-10-CM | POA: Diagnosis not present

## 2017-02-22 ENCOUNTER — Ambulatory Visit (INDEPENDENT_AMBULATORY_CARE_PROVIDER_SITE_OTHER): Payer: Medicare HMO

## 2017-02-22 DIAGNOSIS — Z0000 Encounter for general adult medical examination without abnormal findings: Secondary | ICD-10-CM

## 2017-02-22 DIAGNOSIS — Z23 Encounter for immunization: Secondary | ICD-10-CM | POA: Diagnosis not present

## 2017-02-22 MED ORDER — ZOSTER VAC RECOMB ADJUVANTED 50 MCG/0.5ML IM SUSR: 0.5000 mL | Freq: Once | INTRAMUSCULAR | 1 refills | Status: AC

## 2017-02-22 NOTE — Progress Notes (Addendum)
Subjective:   [name] [name] is a 83 y.o. male who presents for Medicare Annual/Subsequent preventive examination.  Review of Systems:  No ROS.  Medicare Wellness Visit. Additional risk factors are reflected in the social history.  Cardiac Risk Factors include: advanced age (>60men, >30 women);dyslipidemia;male gender;family history of premature cardiovascular disease   Sleep patterns: Sleeps 6 hours, feels rested.  Home Safety/Smoke Alarms: Feels safe in home. Smoke alarms in place.  Living environment; residence and Firearm Safety: Lives with wife in 2 story home.  Seat Belt Safety/Bike Helmet: Wears seat belt.    Male:   CCS-Colonoscopy 09/03/2012, normal. No recall d/t age.      PSA-  Lab Results  Component Value Date   PSA 0.47 10/21/2015   PSA 0.53 09/29/2014   PSA 0.34 12/09/2013       Objective:    Vitals: BP 120/70 (BP Location: Left Arm, Patient Position: Sitting, Cuff Size: Normal)   Pulse 61   Ht 6\' 1"  (1.854 m)   Wt 175 lb 12.8 oz (79.7 kg)   SpO2 95%   BMI 23.19 kg/m   Body mass index is 23.19 kg/m.  Tobacco History  Smoking Status  . Former Smoker  Smokeless Tobacco  . Never Used    Comment: quit 80's     Counseling given: Not Answered   Past Medical History:  Diagnosis Date  . Benign tumor of lung   . BPH (benign prostatic hyperplasia)   . Cardiomyopathy ([place])   . Cataract   . Dilated aortic root (HCC)    Hx of mild  . Hyperlipidemia   . Mitral valve prolapse    irregular heart beat  . Palpitations   . Pure hypercholesterolemia   . RBBB (right bundle branch block)    Past Surgical History:  Procedure Laterality Date  . COSMETIC SURGERY    . Excision on neck     Excision of 2 cm soft tissue mass of left neck  . EYE SURGERY    . HERNIA REPAIR    . Resection of Lung     Hx of, right lung secondary to benign tumor  . SHOULDER ARTHROSCOPY     Lt  . testicular hernia    . VASECTOMY     Family History  Problem Relation Age of  Onset  . Aortic aneurysm Father   . Heart disease Father   . Hypertension Father   . Hyperlipidemia Father   . Colon cancer Sister 51  . Cancer Sister   . Cancer Sister   . Hyperlipidemia Unknown   . Esophageal cancer Neg Hx   . Stomach cancer Neg Hx   . Rectal cancer Neg Hx    History  Sexual Activity  . Sexual activity: No    Outpatient Encounter Prescriptions as of 02/22/2017  Medication Sig  . aspirin 81 MG EC tablet Take 81 mg by mouth daily.    Marland Kitchen atorvastatin (LIPITOR) 40 MG tablet Take 1 tablet (40 mg total) by mouth daily at 6 PM.  . Calcium Carbonate-Vitamin D (CALCIUM 600/VITAMIN D) 600-400 MG-UNIT per tablet Take 1 tablet by mouth 2 (two) times daily.    . carboxymethylcellulose (RETAINE CMC) 0.5 % SOLN 1 drop 3 (three) times daily as needed.  Marland Kitchen co-enzyme Q-10 30 MG capsule Take 30 mg by mouth 2 (two) times daily.    . fish oil-omega-3 fatty acids 1000 MG capsule Take 1 g by mouth daily. Nordic naturals omega 3 TID  . fluticasone (  FLONASE) 50 MCG/ACT nasal spray USE 2 SPRAYS IN EACH NOSTRIL DAILY.  . metoprolol succinate (TOPROL-XL) 25 MG 24 hr tablet Take 0.5 tablets (12.5 mg total) by mouth daily.  . Multiple Vitamins-Minerals (VISION-VITE PRESERVE PO) Take by mouth. 2 per day  . NON FORMULARY HYDROCTISONE BUT 0.1% CREAM 45GM PRN  . Probiotic Product (PROBIOTIC DAILY PO) Take by mouth.  . TURMERIC PO Take 2,000 mg by mouth daily.  Marland Kitchen Zoster Vac Recomb Adjuvanted [facility]) injection Inject 0.5 mLs into the muscle once.   No facility-administered encounter medications on file as of 02/22/2017.     Activities of Daily Living In your present state of health, do you have any difficulty performing the following activities: 02/22/2017 08/28/2016  Hearing? Y N  Vision? N N  Difficulty concentrating or making decisions? N N  Walking or climbing stairs? N N  Dressing or bathing? N N  Doing errands, shopping? N N  Preparing Food and eating ? N N  Using the Toilet? N N  In  the past six months, have you accidently leaked urine? N N  Do you have problems with loss of bowel control? N N  Managing your Medications? N N  Managing your Finances? N N  Housekeeping or managing your Housekeeping? N N  Some recent data might be hidden    Patient Care Team: Midge Minium, MD as PCP - General (Family Medicine) Stanford Breed Denice Bors, MD (Cardiology) Kathie Rhodes, MD (Urology) Tanda Rockers, MD as Consulting Physician (Pulmonary Disease) Allyn Kenner, MD (Dermatology)   Assessment:    Physical assessment deferred to PCP.  Exercise Activities and Dietary recommendations Current Exercise Habits: Home exercise routine, Type of exercise: walking;stretching, Time (Minutes): 20, Frequency (Times/Week): 7, Weekly Exercise (Minutes/Week): 140, Exercise limited by: None identified   Diet (meal preparation, eat out, water intake, caffeinated beverages, dairy products, fruits and vegetables): Drinks water, soda and meal replacement (with protein).   Breakfast: toast/cheese; fruit/cereal; yogurt; milk Lunch: vegetables, meal Dinner: fruit  Goals      Patient Stated   . patient (pt-stated)          Maintain current health by staying active.       Fall Risk Fall Risk  02/22/2017 08/28/2016 12/31/2015 11/05/2015 10/21/2015  Falls in the past year? No No No No Yes   Depression Screen PHQ 2/9 Scores 02/22/2017 08/28/2016 12/31/2015 11/05/2015  PHQ - 2 Score 0 0 0 0  PHQ- 9 Score - 0 - -    Cognitive Function       Ad8 score reviewed for issues:  Issues making decisions: no  Less interest in hobbies / activities: no  Repeats questions, stories (family complaining): no  Trouble using ordinary gadgets (microwave, computer, phone): no  Forgets the month or year: no  Mismanaging finances: no  Remembering appts: no  Daily problems with thinking and/or memory: no Ad8 score is=0     Immunization History  Administered Date(s) Administered  . Influenza Split  06/05/2009, 02/22/2013, 02/26/2015  . Influenza, High Dose Seasonal PF 03/02/2014  . Influenza-Unspecified 02/01/2016, 01/03/2017  . Pneumococcal Conjugate-13 12/09/2013  . Pneumococcal Polysaccharide-23 06/05/2004, 08/28/2016  . Td 09/21/2009  . Zoster 06/05/2008   Screening Tests Health Maintenance  Topic Date Due  . TETANUS/TDAP  09/22/2019  . INFLUENZA VACCINE  Completed  . PNA vac Low Risk Adult  Completed      Plan:     Shingles vaccine at pharmacy.   Bring a copy of your living  will and/or healthcare power of attorney to your next office visit.  Continue doing brain stimulating activities (puzzles, reading, adult coloring books, staying active) to keep memory sharp.    I have personally reviewed and noted the following in the patient's chart:   . Medical and social history . Use of alcohol, tobacco or illicit drugs  . Current medications and supplements . Functional ability and status . Nutritional status . Physical activity . Advanced directives . List of other physicians . Hospitalizations, surgeries, and ER visits in previous 12 months . Vitals . Screenings to include cognitive, depression, and falls . Referrals and appointments  In addition, I have reviewed and discussed with patient certain preventive protocols, quality metrics, and best practice recommendations. A written personalized care plan for preventive services as well as general preventive health recommendations were provided to patient.     Gerilyn Nestle, RN  02/22/2017   Reviewed documentation and agree w/ above.  Annye Asa, MD

## 2017-02-22 NOTE — Patient Instructions (Addendum)
Shingles vaccine at pharmacy.   Bring a copy of your living will and/or healthcare power of attorney to your next office visit.  Continue doing brain stimulating activities (puzzles, reading, adult coloring books, staying active) to keep memory sharp.   Fall Prevention in the Home Falls can cause injuries. They can happen to people of all ages. There are many things you can do to make your home safe and to help prevent falls. What can I do on the outside of my home?  Regularly fix the edges of walkways and driveways and fix any cracks.  Remove anything that might make you trip as you walk through a door, such as a raised step or threshold.  Trim any bushes or trees on the path to your home.  Use bright outdoor lighting.  Clear any walking paths of anything that might make someone trip, such as rocks or tools.  Regularly check to see if handrails are loose or broken. Make sure that both sides of any steps have handrails.  Any raised decks and porches should have guardrails on the edges.  Have any leaves, snow, or ice cleared regularly.  Use sand or salt on walking paths during winter.  Clean up any spills in your garage right away. This includes oil or grease spills. What can I do in the bathroom?  Use night lights.  Install grab bars by the toilet and in the tub and shower. Do not use towel bars as grab bars.  Use non-skid mats or decals in the tub or shower.  If you need to sit down in the shower, use a plastic, non-slip stool.  Keep the floor dry. Clean up any water that spills on the floor as soon as it happens.  Remove soap buildup in the tub or shower regularly.  Attach bath mats securely with double-sided non-slip rug tape.  Do not have throw rugs and other things on the floor that can make you trip. What can I do in the bedroom?  Use night lights.  Make sure that you have a light by your bed that is easy to reach.  Do not use any sheets or blankets that are  too big for your bed. They should not hang down onto the floor.  Have a firm chair that has side arms. You can use this for support while you get dressed.  Do not have throw rugs and other things on the floor that can make you trip. What can I do in the kitchen?  Clean up any spills right away.  Avoid walking on wet floors.  Keep items that you use a lot in easy-to-reach places.  If you need to reach something above you, use a strong step stool that has a grab bar.  Keep electrical cords out of the way.  Do not use floor polish or wax that makes floors slippery. If you must use wax, use non-skid floor wax.  Do not have throw rugs and other things on the floor that can make you trip. What can I do with my stairs?  Do not leave any items on the stairs.  Make sure that there are handrails on both sides of the stairs and use them. Fix handrails that are broken or loose. Make sure that handrails are as long as the stairways.  Check any carpeting to make sure that it is firmly attached to the stairs. Fix any carpet that is loose or worn.  Avoid having throw rugs at the top or   or bottom of the stairs. If you do have throw rugs, attach them to the floor with carpet tape.  Make sure that you have a light switch at the top of the stairs and the bottom of the stairs. If you do not have them, ask someone to add them for you. What else can I do to help prevent falls?  Wear shoes that: ? Do not have high heels. ? Have rubber bottoms. ? Are comfortable and fit you well. ? Are closed at the toe. Do not wear sandals.  If you use a stepladder: ? Make sure that it is fully opened. Do not climb a closed stepladder. ? Make sure that both sides of the stepladder are locked into place. ? Ask someone to hold it for you, if possible.  Clearly mark and make sure that you can see: ? Any grab bars or handrails. ? First and last steps. ? Where the edge of each step is.  Use tools that help you  move around (mobility aids) if they are needed. These include: ? Canes. ? Walkers. ? Scooters. ? Crutches.  Turn on the lights when you go into a dark area. Replace any light bulbs as soon as they burn out.  Set up your furniture so you have a clear path. Avoid moving your furniture around.  If any of your floors are uneven, fix them.  If there are any pets around you, be aware of where they are.  Review your medicines with your doctor. Some medicines can make you feel dizzy. This can increase your chance of falling. Ask your doctor what other things that you can do to help prevent falls. This information is not intended to replace advice given to you by your health care provider. Make sure you discuss any questions you have with your health care provider. Document Released: 03/18/2009 Document Revised: 10/28/2015 Document Reviewed: 06/26/2014 Elsevier Interactive Patient Education  2018 [place] Maintenance, Male A healthy lifestyle and preventive care is important for your health and wellness. Ask your health care provider about what schedule of regular examinations is right for you. What should I know about weight and diet? Eat a Healthy Diet  Eat plenty of vegetables, fruits, whole grains, low-fat dairy products, and lean protein.  Do not eat a lot of foods high in solid fats, added sugars, or salt.  Maintain a Healthy Weight Regular exercise can help you achieve or maintain a healthy weight. You should:  Do at least 150 minutes of exercise each week. The exercise should increase your heart rate and make you sweat (moderate-intensity exercise).  Do strength-training exercises at least twice a week.  Watch Your Levels of Cholesterol and Blood Lipids  Have your blood tested for lipids and cholesterol every 5 years starting at 81 years of age. If you are at high risk for heart disease, you should start having your blood tested when you are 81 years old. You may  need to have your cholesterol levels checked more often if: ? Your lipid or cholesterol levels are high. ? You are older than 81 years of age. ? You are at high risk for heart disease.  What should I know about cancer screening? Many types of cancers can be detected early and may often be prevented. Lung Cancer  You should be screened every year for lung cancer if: ? You are a current smoker who has smoked for at least 30 years. ? You are a former smoker  who has quit within the past 15 years.  Talk to your health care provider about your screening options, when you should start screening, and how often you should be screened.  Colorectal Cancer  Routine colorectal cancer screening usually begins at 81 years of age and should be repeated every 5-10 years until you are 81 years old. You may need to be screened more often if early forms of precancerous polyps or small growths are found. Your health care provider may recommend screening at an earlier age if you have risk factors for colon cancer.  Your health care provider may recommend using home test kits to check for hidden blood in the stool.  A small camera at the end of a tube can be used to examine your colon (sigmoidoscopy or colonoscopy). This checks for the earliest forms of colorectal cancer.  Prostate and Testicular Cancer  Depending on your age and overall health, your health care provider may do certain tests to screen for prostate and testicular cancer.  Talk to your health care provider about any symptoms or concerns you have about testicular or prostate cancer.  Skin Cancer  Check your skin from head to toe regularly.  Tell your health care provider about any new moles or changes in moles, especially if: ? There is a change in a mole's size, shape, or color. ? You have a mole that is larger than a pencil eraser.  Always use sunscreen. Apply sunscreen liberally and repeat throughout the day.  Protect yourself by  wearing long sleeves, pants, a wide-brimmed hat, and sunglasses when outside.  What should I know about heart disease, diabetes, and high blood pressure?  If you are 71-37 years of age, have your blood pressure checked every 3-5 years. If you are 48 years of age or older, have your blood pressure checked every year. You should have your blood pressure measured twice-once when you are at a hospital or clinic, and once when you are not at a hospital or clinic. Record the average of the two measurements. To check your blood pressure when you are not at a hospital or clinic, you can use: ? An automated blood pressure machine at a pharmacy. ? A home blood pressure monitor.  Talk to your health care provider about your target blood pressure.  If you are between 97-40 years old, ask your health care provider if you should take aspirin to prevent heart disease.  Have regular diabetes screenings by checking your fasting blood sugar level. ? If you are at a normal weight and have a low risk for diabetes, have this test once every three years after the age of 81. ? If you are overweight and have a high risk for diabetes, consider being tested at a younger age or more often.  A one-time screening for abdominal aortic aneurysm (AAA) by ultrasound is recommended for men aged 68-75 years who are current or former smokers. What should I know about preventing infection? Hepatitis B If you have a higher risk for hepatitis B, you should be screened for this virus. Talk with your health care provider to find out if you are at risk for hepatitis B infection. Hepatitis C Blood testing is recommended for:  Everyone born from [birth year] through 1963.  Anyone with known risk factors for hepatitis C.  Sexually Transmitted Diseases (STDs)  You should be screened each year for STDs including gonorrhea and chlamydia if: ? You are sexually active and are younger than 81 years of age. ?  You are older than 81 years of age  and your health care provider tells you that you are at risk for this type of infection. ? Your sexual activity has changed since you were last screened and you are at an increased risk for chlamydia or gonorrhea. Ask your health care provider if you are at risk.  Talk with your health care provider about whether you are at high risk of being infected with HIV. Your health care provider may recommend a prescription medicine to help prevent HIV infection.  What else can I do?  Schedule regular health, dental, and eye exams.  Stay current with your vaccines (immunizations).  Do not use any tobacco products, such as cigarettes, chewing tobacco, and e-cigarettes. If you need help quitting, ask your health care provider.  Limit alcohol intake to no more than 2 drinks per day. One drink equals 12 ounces of beer, 5 ounces of wine, or 1 ounces of hard liquor.  Do not use street drugs.  Do not share needles.  Ask your health care provider for help if you need support or information about quitting drugs.  Tell your health care provider if you often feel depressed.  Tell your health care provider if you have ever been abused or do not feel safe at home. This information is not intended to replace advice given to you by your health care provider. Make sure you discuss any questions you have with your health care provider. Document Released: 11/18/2007 Document Revised: 01/19/2016 Document Reviewed: 02/23/2015 Elsevier Interactive Patient Education  [employer].

## 2017-03-01 ENCOUNTER — Other Ambulatory Visit: Payer: Self-pay | Admitting: Family Medicine

## 2017-03-01 DIAGNOSIS — E785 Hyperlipidemia, unspecified: Secondary | ICD-10-CM

## 2017-03-15 DIAGNOSIS — L570 Actinic keratosis: Secondary | ICD-10-CM | POA: Diagnosis not present

## 2017-03-15 DIAGNOSIS — R208 Other disturbances of skin sensation: Secondary | ICD-10-CM | POA: Diagnosis not present

## 2017-03-15 DIAGNOSIS — D225 Melanocytic nevi of trunk: Secondary | ICD-10-CM | POA: Diagnosis not present

## 2017-03-15 DIAGNOSIS — L258 Unspecified contact dermatitis due to other agents: Secondary | ICD-10-CM | POA: Diagnosis not present

## 2017-03-15 DIAGNOSIS — X32XXXD Exposure to sunlight, subsequent encounter: Secondary | ICD-10-CM | POA: Diagnosis not present

## 2017-05-28 ENCOUNTER — Encounter: Payer: Self-pay | Admitting: Family Medicine

## 2017-05-28 ENCOUNTER — Other Ambulatory Visit: Payer: Self-pay

## 2017-05-28 ENCOUNTER — Ambulatory Visit (INDEPENDENT_AMBULATORY_CARE_PROVIDER_SITE_OTHER): Payer: Medicare HMO | Admitting: Family Medicine

## 2017-05-28 VITALS — BP 121/72 | HR 62 | Temp 97.9°F | Resp 17 | Ht 73.0 in | Wt 176.1 lb

## 2017-05-28 DIAGNOSIS — I429 Cardiomyopathy, unspecified: Secondary | ICD-10-CM

## 2017-05-28 DIAGNOSIS — I712 Thoracic aortic aneurysm, without rupture, unspecified: Secondary | ICD-10-CM

## 2017-05-28 DIAGNOSIS — Z0000 Encounter for general adult medical examination without abnormal findings: Secondary | ICD-10-CM | POA: Diagnosis not present

## 2017-05-28 DIAGNOSIS — E7800 Pure hypercholesterolemia, unspecified: Secondary | ICD-10-CM

## 2017-05-28 DIAGNOSIS — Z125 Encounter for screening for malignant neoplasm of prostate: Secondary | ICD-10-CM | POA: Diagnosis not present

## 2017-05-28 LAB — HEPATIC FUNCTION PANEL
ALK PHOS: 66 U/L (ref 39–117)
ALT: 16 U/L (ref 0–53)
AST: 18 U/L (ref 0–37)
Albumin: 4.4 g/dL (ref 3.5–5.2)
BILIRUBIN DIRECT: 0.2 mg/dL (ref 0.0–0.3)
BILIRUBIN TOTAL: 1 mg/dL (ref 0.2–1.2)
Total Protein: 6.7 g/dL (ref 6.0–8.3)

## 2017-05-28 LAB — BASIC METABOLIC PANEL
BUN: 18 mg/dL (ref 6–23)
CO2: 33 meq/L — AB (ref 19–32)
Calcium: 9.8 mg/dL (ref 8.4–10.5)
Chloride: 102 mEq/L (ref 96–112)
Creatinine, Ser: 0.92 mg/dL (ref 0.40–1.50)
GFR: 83.21 mL/min (ref >60.00)
GLUCOSE: 96 mg/dL (ref 70–99)
POTASSIUM: 4.7 meq/L (ref 3.5–5.1)
SODIUM: 139 meq/L (ref 135–145)

## 2017-05-28 LAB — LIPID PANEL
CHOLESTEROL: 120 mg/dL (ref 0–200)
HDL: 43.7 mg/dL (ref >39.00)
LDL CALC: 61 mg/dL (ref 0–99)
NonHDL: 76.25
Total CHOL/HDL Ratio: 3
Triglycerides: 75 mg/dL (ref 0.0–149.0)
VLDL: 15 mg/dL (ref 0.0–40.0)

## 2017-05-28 LAB — CBC WITH DIFFERENTIAL/PLATELET
BASOS ABS: 0 10*3/uL (ref 0.0–0.1)
BASOS PCT: 0.8 % (ref 0.0–3.0)
Eosinophils Absolute: 0.1 10*3/uL (ref 0.0–0.7)
Eosinophils Relative: 2.4 % (ref 0.0–5.0)
HCT: 46.9 % (ref 39.0–52.0)
Hemoglobin: 15.4 g/dL (ref 13.0–17.0)
LYMPHS ABS: 1 10*3/uL (ref 0.7–4.0)
Lymphocytes Relative: 18.7 % (ref 12.0–46.0)
MCHC: 32.9 g/dL (ref 30.0–36.0)
MCV: 93.9 fl (ref 78.0–100.0)
MONOS PCT: 10.4 % (ref 3.0–12.0)
Monocytes Absolute: 0.5 10*3/uL (ref 0.1–1.0)
NEUTROS ABS: 3.4 10*3/uL (ref 1.4–7.7)
NEUTROS PCT: 67.7 % (ref 43.0–77.0)
PLATELETS: 168 10*3/uL (ref 150.0–400.0)
RBC: 4.99 Mil/uL (ref 4.22–5.81)
RDW: 12.9 % (ref 11.5–15.5)
WBC: 5.1 10*3/uL (ref 4.0–10.5)

## 2017-05-28 LAB — TSH: TSH: 2.17 u[IU]/mL (ref 0.35–4.50)

## 2017-05-28 LAB — PSA, MEDICARE: PSA: 0.3 ng/ml (ref 0.10–4.00)

## 2017-05-28 NOTE — Assessment & Plan Note (Signed)
Chronic problem.  Tolerating statin w/o difficulty.  Check labs.  Adjust meds prn  

## 2017-05-28 NOTE — Assessment & Plan Note (Signed)
Chronic problem.  Having yearly imaging w/ Dr Stanford Breed

## 2017-05-28 NOTE — Assessment & Plan Note (Signed)
Chronic problem.  On ASA and beta blocker.  Following w/ Dr Stanford Breed.  Asymptomatic at this time.  Will follow.

## 2017-05-28 NOTE — Progress Notes (Signed)
   Subjective:    Patient ID: [name] [name], male    DOB: 1930-09-04, 83 y.o.   MRN: [id number]  HPI CPE- UTD colonoscopy, immunizations. Pt reports feeling good.    Review of Systems Patient reports no vision/hearing changes, anorexia, fever ,adenopathy, persistant/recurrent hoarseness, swallowing issues, chest pain, palpitations, edema, persistant/recurrent cough, hemoptysis, dyspnea (rest,exertional, paroxysmal nocturnal), gastrointestinal  bleeding (melena, rectal bleeding), abdominal pain, excessive heart burn, GU symptoms (dysuria, hematuria, voiding/incontinence issues) syncope, focal weakness, memory loss, numbness & tingling, skin/hair/nail changes, depression, anxiety, abnormal bruising/bleeding, musculoskeletal symptoms/signs.     Objective:   Physical Exam General Appearance:    Alert, cooperative, no distress, appears stated age  Head:    Normocephalic, without obvious abnormality, atraumatic  Eyes:    PERRL, conjunctiva/corneas clear, EOM's intact, fundi    benign, both eyes       Ears:    Normal TM's and external ear canals, both ears  Nose:   Nares normal, septum midline, mucosa normal, no drainage   or sinus tenderness  Throat:   Lips, mucosa, and tongue normal; teeth and gums normal  Neck:   Supple, symmetrical, trachea midline, no adenopathy;       thyroid:  No enlargement/tenderness/nodules  Back:     Symmetric, no curvature, ROM normal, no CVA tenderness  Lungs:     Clear to auscultation bilaterally, respirations unlabored  Chest wall:    No tenderness or deformity  Heart:    Regular rate and rhythm, S1 and S2 normal, no murmur, rub   or gallop  Abdomen:     Soft, non-tender, bowel sounds active all four quadrants,    no masses, no organomegaly  Genitalia:    Deferred at pt request  Rectal:    Extremities:   Extremities normal, atraumatic, no cyanosis or edema  Pulses:   2+ and symmetric all extremities  Skin:   Skin color, texture, turgor normal, no rashes or  lesions  Lymph nodes:   Cervical, supraclavicular, and axillary nodes normal  Neurologic:   CNII-XII intact. Normal strength, sensation and reflexes      throughout          Assessment & Plan:

## 2017-05-28 NOTE — Assessment & Plan Note (Signed)
Pt's PE WNL.  UTD on colonoscopy and immunizations.  Pt brought his living will and POA today.  Check labs.  Anticipatory guidance provided.

## 2017-05-28 NOTE — Patient Instructions (Signed)
Follow up in 6 months to recheck cholesterol We'll notify you of your lab results and make any changes if needed Keep up the good work!  You look great! Call with any questions or concerns Merry Christmas!!!

## 2017-05-30 ENCOUNTER — Encounter: Payer: Self-pay | Admitting: Emergency Medicine

## 2017-05-31 ENCOUNTER — Encounter: Payer: Medicare HMO | Admitting: Family Medicine

## 2017-06-21 ENCOUNTER — Other Ambulatory Visit: Payer: Self-pay | Admitting: Family Medicine

## 2017-06-21 DIAGNOSIS — I10 Essential (primary) hypertension: Secondary | ICD-10-CM

## 2017-09-12 ENCOUNTER — Other Ambulatory Visit: Payer: Self-pay | Admitting: Family Medicine

## 2017-09-12 DIAGNOSIS — E785 Hyperlipidemia, unspecified: Secondary | ICD-10-CM

## 2017-09-13 DIAGNOSIS — H04123 Dry eye syndrome of bilateral lacrimal glands: Secondary | ICD-10-CM | POA: Diagnosis not present

## 2017-09-13 DIAGNOSIS — H353131 Nonexudative age-related macular degeneration, bilateral, early dry stage: Secondary | ICD-10-CM | POA: Diagnosis not present

## 2017-09-13 DIAGNOSIS — H25013 Cortical age-related cataract, bilateral: Secondary | ICD-10-CM | POA: Diagnosis not present

## 2017-09-13 DIAGNOSIS — H2513 Age-related nuclear cataract, bilateral: Secondary | ICD-10-CM | POA: Diagnosis not present

## 2017-09-19 DIAGNOSIS — H6122 Impacted cerumen, left ear: Secondary | ICD-10-CM | POA: Diagnosis not present

## 2017-09-19 DIAGNOSIS — L03313 Cellulitis of chest wall: Secondary | ICD-10-CM | POA: Diagnosis not present

## 2017-09-20 ENCOUNTER — Other Ambulatory Visit: Payer: Self-pay

## 2017-09-20 ENCOUNTER — Ambulatory Visit (INDEPENDENT_AMBULATORY_CARE_PROVIDER_SITE_OTHER): Payer: Medicare HMO | Admitting: Physician Assistant

## 2017-09-20 ENCOUNTER — Encounter: Payer: Self-pay | Admitting: Physician Assistant

## 2017-09-20 VITALS — BP 128/70 | HR 90 | Temp 97.6°F | Resp 18 | Ht 72.91 in | Wt 179.0 lb

## 2017-09-20 DIAGNOSIS — L089 Local infection of the skin and subcutaneous tissue, unspecified: Secondary | ICD-10-CM

## 2017-09-20 DIAGNOSIS — L723 Sebaceous cyst: Secondary | ICD-10-CM

## 2017-09-20 DIAGNOSIS — H6122 Impacted cerumen, left ear: Secondary | ICD-10-CM | POA: Diagnosis not present

## 2017-09-20 MED ORDER — DOXYCYCLINE HYCLATE 100 MG PO CAPS: 100.0000 mg | ORAL_CAPSULE | Freq: Two times a day (BID) | ORAL | 0 refills | Status: DC

## 2017-09-20 NOTE — Patient Instructions (Addendum)
Take the antibiotic as prescribed. Apply a warm compress to the area for 15-20 minutes 2-4 times each day.  Change the dressing if it becomes saturated, leaks or falls off. If symptoms worsen or do not improve, please come back. Please return in 2 days for reevaluation.      While taking Doxycycline:  -Do not drink milk or take iron supplements, multivitamins, calcium supplements, antacids, laxatives within 2 hours before or after taking doxycycline. -Avoid direct exposure to sunlight or tanning beds. Doxycycline can make you sunburn more easily. Wear protective clothing and use sunscreen (SPF 30 or higher) when you are outdoors. -Antibiotic medicines can cause diarrhea, which may be a sign of a new infection. If you have diarrhea that is watery or bloody, stop taking this medicine and seek medical care.     IF you received an x-ray today, you will receive an invoice from [facility]. Please contact [facility] at [phone] with questions or concerns regarding your invoice.   IF you received labwork today, you will receive an invoice from [facility]. Please contact [facility] at [phone] with questions or concerns regarding your invoice.   Our billing staff will not be able to assist you with questions regarding bills from these companies.  You will be contacted with the lab results as soon as they are available. The fastest way to get your results is to activate your My Chart account. Instructions are located on the last page of this paperwork. If you have not heard from [place] regarding the results in 2 weeks, please contact this office.

## 2017-09-20 NOTE — Progress Notes (Signed)
MRN: [id number] DOB: [birth year]  Subjective:   [name] [name] is a 84 y.o. male presenting for chief complaint of Ear Fullness (X 2 days- with some pain) and Mass (X 2 years- rigth side of chest pt states that it was removed but came back) .  Reports  2 day history of left ear fullness. Went to [facility] yesterday to try and have it removed but it was unsuccessful because the "earwax was too hard." Has decreased hearing and tinitnus. Denise pain, itching, and dizziness. No right ear involvement. Has tried OTC drops with no relief. Has had to have them lavaged in the past. Deneis use of qtips or acute trauma to ears.   Also has mass of right anterior chest x 1 week. Had infected seb cyst in same area in 2015 and had to have it drained. Notes since it was drained in 2015, it had slowly grew back but for the past week has been red and irritated. It broke open 3 days ago and he got out some purulent discharge. Has been applying warm compresses. Denies pain, fever, and chills. Not on anticoagulation. No hx of MRSA or diabetes.   Jessen has a current medication list which includes the following prescription(s): aspirin, atorvastatin, calcium carbonate-vitamin d, carboxymethylcellulose, co-enzyme q-10, fish oil-omega-3 fatty acids, fluticasone, metoprolol succinate, multiple vitamins-minerals, NON FORMULARY, probiotic product, and turmeric. Also has No Known Allergies.  Lazaro  has a past medical history of Benign tumor of lung, BPH (benign prostatic hyperplasia), Cardiomyopathy ([place]), Cataract, Dilated aortic root ([place]), Hyperlipidemia, Mitral valve prolapse, Palpitations, Pure hypercholesterolemia, and RBBB (right bundle branch block). Also  has a past surgical history that includes Excision on neck; Resection of Lung; testicular hernia; Shoulder arthroscopy; Cosmetic surgery; Vasectomy; Eye surgery; and Hernia repair.   Objective:   Vitals: BP 128/70 (BP Location: Left Arm, Patient Position: Sitting,  Cuff Size: Normal)   Pulse 90   Temp 97.6 F (36.4 C) (Oral)   Resp 18   Ht 6' 0.91" (1.852 m)   Wt 179 lb (81.2 kg)   SpO2 97%   BMI 23.67 kg/m   Physical Exam  Constitutional: He is oriented to person, place, and time. He appears well-developed and well-nourished.  HENT:  Head: Normocephalic and atraumatic.  Right Ear: Tympanic membrane, external ear and ear canal normal.  Left Ear: External ear normal. There is drainage.  Left ear canal impacted with copious amount of dark brown dry cerumen, unable to visualize TM.  Post left ear lavage, ear canal is clear.  TM is visualized and is normal in appearance.  No erythema or bulging of TM noted.    Eyes: Conjunctivae are normal.  Neck: Normal range of motion.  Pulmonary/Chest: Effort normal.  Neurological: He is alert and oriented to person, place, and time.  Skin: Skin is warm and dry.     Psychiatric: He has a normal mood and affect.  Vitals reviewed.   Post ear lavage, patient admits that he can hear better and feels much better.  PROCEDURE NOTE: Infected Sebaceous Cyst I&D Verbal consent obtained. Local anesthesia with 3cc of 2% lidocaine with epinephrine. Sterile prep and drape. An incision was made extending ~1cm using a 11 blade scalpel, discharge of mild amount of purulent drainage and copious amounts of sebaceous material expressed.Wound cavity was explored with curved hemostats. Sebaceous cyst capsule removed. Wound cavity packed with 0.25" plain packing. Pt tolerated well. Cleansed and dressed. After care instructions provided. Patient to return to clinic  on 2 days for reevaluation/repacking.  No results found for this or any previous visit (from the past 24 hour(s)).  Assessment and Plan :  1. Infected sebaceous cyst I&D performed.  Wound care instructions given.  Encouraged to take antibiotic as prescribed and apply warm compresses to affected area 4-5 times a day for 20 minutes at a time.  Wound culture  pending.  Follow-up in 2 days for packing reevaluation. - WOUND CULTURE - doxycycline (VIBRAMYCIN) 100 MG capsule; Take 1 capsule (100 mg total) by mouth 2 (two) times daily.  Dispense: 20 capsule; Refill: 0  2. Left ear impacted cerumen Resolved.   - Ear Lavage - Ear wax removal  Tenna Delaine, PA-C  [facility] 09/20/2017 9:13 AM

## 2017-09-21 ENCOUNTER — Ambulatory Visit: Payer: Medicare HMO | Admitting: Physician Assistant

## 2017-09-22 ENCOUNTER — Other Ambulatory Visit: Payer: Self-pay

## 2017-09-22 ENCOUNTER — Ambulatory Visit (INDEPENDENT_AMBULATORY_CARE_PROVIDER_SITE_OTHER): Payer: Medicare HMO | Admitting: Physician Assistant

## 2017-09-22 ENCOUNTER — Encounter: Payer: Self-pay | Admitting: Physician Assistant

## 2017-09-22 VITALS — BP 104/60 | HR 69 | Temp 97.6°F | Resp 16 | Wt 180.0 lb

## 2017-09-22 DIAGNOSIS — L089 Local infection of the skin and subcutaneous tissue, unspecified: Secondary | ICD-10-CM

## 2017-09-22 DIAGNOSIS — L723 Sebaceous cyst: Secondary | ICD-10-CM

## 2017-09-22 LAB — WOUND CULTURE

## 2017-09-22 NOTE — Progress Notes (Signed)
   09/22/2017 9:09 AM   DOB: 06/02/1931 / MRN: [id number]  SUBJECTIVE:  Mr. Commons is a well appearing 84 y.o. here today for wound care. He denies exquisite tenderness at the site of the wound, nausea, emesis, fever and chills.  He has been compliant with medical therapy and recommendations thus far.   He has No Known Allergies.    ROS per HPI  Problem list and medications reviewed and updated by myself where necessary, and exist elsewhere in the encounter.   OBJECTIVE:  BP 104/60 (BP Location: Right Arm)   Pulse 69   Temp 97.6 F (36.4 C) (Oral)   Resp 16   Wt 180 lb (81.6 kg)   SpO2 98%   BMI 23.80 kg/m  CrCl cannot be calculated (Patient's most recent lab result is older than the maximum 21 days allowed.).  Physical Exam  Constitutional: He is oriented to person, place, and time. He appears well-developed. He does not appear ill.  Eyes: Pupils are equal, round, and reactive to light. Conjunctivae and EOM are normal.  Cardiovascular: Normal rate.  Pulmonary/Chest: Effort normal.  Abdominal: He exhibits no distension.  Musculoskeletal: Normal range of motion.  Neurological: He is alert and oriented to person, place, and time. No cranial nerve deficit. Coordination normal.  Skin: Skin is warm and dry. He is not diaphoretic.  Roughly 12 inches of packing pulled from the wound.  Wound is nontender and negative for exudate.  Wound repacked with roughly 4 inches of packing and a clean dressing was placed along with Tegaderm.  Psychiatric: He has a normal mood and affect.  Nursing note and vitals reviewed.      Results for orders placed or performed in visit on 09/20/17 (from the past 48 hour(s))  WOUND CULTURE     Status: None (Preliminary result)   Collection Time: 09/20/17 10:31 AM  Result Value Ref Range   Gram Stain Result Final report    Organism ID, Bacteria Comment     Comment: Many white blood cells.   Organism ID, Bacteria Comment     Comment: Moderate number  of gram negative diplococci.   Organism ID, Bacteria Comment     Comment: Rare gram positive cocci   Organism ID, Bacteria Comment     Comment: Rare gram negative rods.   Aerobic Bacterial Culture WILL FOLLOW     ASSESSMENT AND PLAN  Gunter was seen today for cyst.  Diagnoses and all orders for this visit:  Infected sebaceous cyst: Wound healing as expected.  Advised to come back in about 48 hours and I would expect that the packing could be pulled and would allow the wound to heal by secondary intention by that time.    The patient was advised to call or return to clinic if he does not see an improvement in symptoms or to seek the care of the closest emergency department if he worsens with the above plan.   Philis Fendt, MHS, PA-C [facility] 09/22/2017 9:09 AM

## 2017-09-22 NOTE — Patient Instructions (Addendum)
Please make an appointment in 48 hours to be seen by one of our providers.    IF you received an x-ray today, you will receive an invoice from [facility]. Please contact [facility] at [phone] with questions or concerns regarding your invoice.   IF you received labwork today, you will receive an invoice from [facility]. Please contact [facility] at [phone] with questions or concerns regarding your invoice.   Our billing staff will not be able to assist you with questions regarding bills from these companies.  You will be contacted with the lab results as soon as they are available. The fastest way to get your results is to activate your My Chart account. Instructions are located on the last page of this paperwork. If you have not heard from [place] regarding the results in 2 weeks, please contact this office.

## 2017-09-24 ENCOUNTER — Other Ambulatory Visit: Payer: Self-pay

## 2017-09-24 ENCOUNTER — Ambulatory Visit (INDEPENDENT_AMBULATORY_CARE_PROVIDER_SITE_OTHER): Payer: Medicare HMO | Admitting: Physician Assistant

## 2017-09-24 ENCOUNTER — Encounter: Payer: Self-pay | Admitting: Physician Assistant

## 2017-09-24 VITALS — BP 124/66 | HR 55 | Temp 97.8°F | Resp 18 | Ht 72.0 in | Wt 180.2 lb

## 2017-09-24 DIAGNOSIS — L089 Local infection of the skin and subcutaneous tissue, unspecified: Secondary | ICD-10-CM

## 2017-09-24 DIAGNOSIS — L723 Sebaceous cyst: Secondary | ICD-10-CM

## 2017-09-24 NOTE — Patient Instructions (Signed)
     IF you received an x-ray today, you will receive an invoice from [facility]. Please contact [facility] at [phone] with questions or concerns regarding your invoice.   IF you received labwork today, you will receive an invoice from [facility]. Please contact [facility] at [phone] with questions or concerns regarding your invoice.   Our billing staff will not be able to assist you with questions regarding bills from these companies.  You will be contacted with the lab results as soon as they are available. The fastest way to get your results is to activate your My Chart account. Instructions are located on the last page of this paperwork. If you have not heard from us regarding the results in 2 weeks, please contact this office.     

## 2017-09-24 NOTE — Progress Notes (Signed)
   09/24/2017 10:44 AM   DOB: [birth year] / MRN: [id number]  SUBJECTIVE:  Mr. Petrich is a well appearing 84 y.o. here today for wound care. He denies exquisite tenderness at the site of the wound, nausea, emesis, fever and chills.  He has been compliant with medical therapy and recommendations thus far.   He has No Known Allergies.    ROS per HPI  Problem list and medications reviewed and updated by myself where necessary, and exist elsewhere in the encounter.   OBJECTIVE:  BP 124/66 (BP Location: Left Arm, Patient Position: Sitting, Cuff Size: Normal)   Pulse (!) 55   Temp 97.8 F (36.6 C) (Oral)   Resp 18   Ht 6' (1.829 m)   Wt 180 lb 3.2 oz (81.7 kg)   SpO2 98%   BMI 24.44 kg/m  CrCl cannot be calculated (Patient's most recent lab result is older than the maximum 21 days allowed.).  Physical Exam  Constitutional: He is oriented to person, place, and time. He appears well-developed. He does not appear ill.  Eyes: Pupils are equal, round, and reactive to light. Conjunctivae and EOM are normal.  Cardiovascular: Normal rate.  Pulmonary/Chest: Effort normal.    Abdominal: He exhibits no distension.  Musculoskeletal: Normal range of motion.  Neurological: He is alert and oriented to person, place, and time. No cranial nerve deficit. Coordination normal.  Skin: Skin is warm and dry. He is not diaphoretic.  Psychiatric: He has a normal mood and affect.  Nursing note and vitals reviewed.   No results found for this or any previous visit (from the past 48 hour(s)).  ASSESSMENT AND PLAN  Tarrell was seen today for wound check.  Diagnoses and all orders for this visit:  Infected sebaceous cyst: no need for follow-up.  RTC as needed.    The patient was advised to call or return to clinic if he does not see an improvement in symptoms or to seek the care of the closest emergency department if he worsens with the above plan.   Philis Fendt, MHS, PA-C [facility] 09/24/2017 10:44 AM

## 2017-10-23 DIAGNOSIS — K219 Gastro-esophageal reflux disease without esophagitis: Secondary | ICD-10-CM | POA: Diagnosis not present

## 2017-10-23 DIAGNOSIS — E785 Hyperlipidemia, unspecified: Secondary | ICD-10-CM | POA: Diagnosis not present

## 2017-10-23 DIAGNOSIS — H25011 Cortical age-related cataract, right eye: Secondary | ICD-10-CM | POA: Diagnosis not present

## 2017-10-23 DIAGNOSIS — Z87891 Personal history of nicotine dependence: Secondary | ICD-10-CM | POA: Diagnosis not present

## 2017-10-23 DIAGNOSIS — Z7982 Long term (current) use of aspirin: Secondary | ICD-10-CM | POA: Diagnosis not present

## 2017-10-23 DIAGNOSIS — Z79899 Other long term (current) drug therapy: Secondary | ICD-10-CM | POA: Diagnosis not present

## 2017-10-23 DIAGNOSIS — H2511 Age-related nuclear cataract, right eye: Secondary | ICD-10-CM | POA: Diagnosis not present

## 2017-10-27 IMAGING — DX DG CHEST 2V
2 series · 2 of 2 positions shown · non-contrast
Comparison: Chest CT 04/05/2015

CLINICAL DATA: Abnormal chest CT.  Cough

EXAM:
CHEST  2 VIEW

[chest lat]
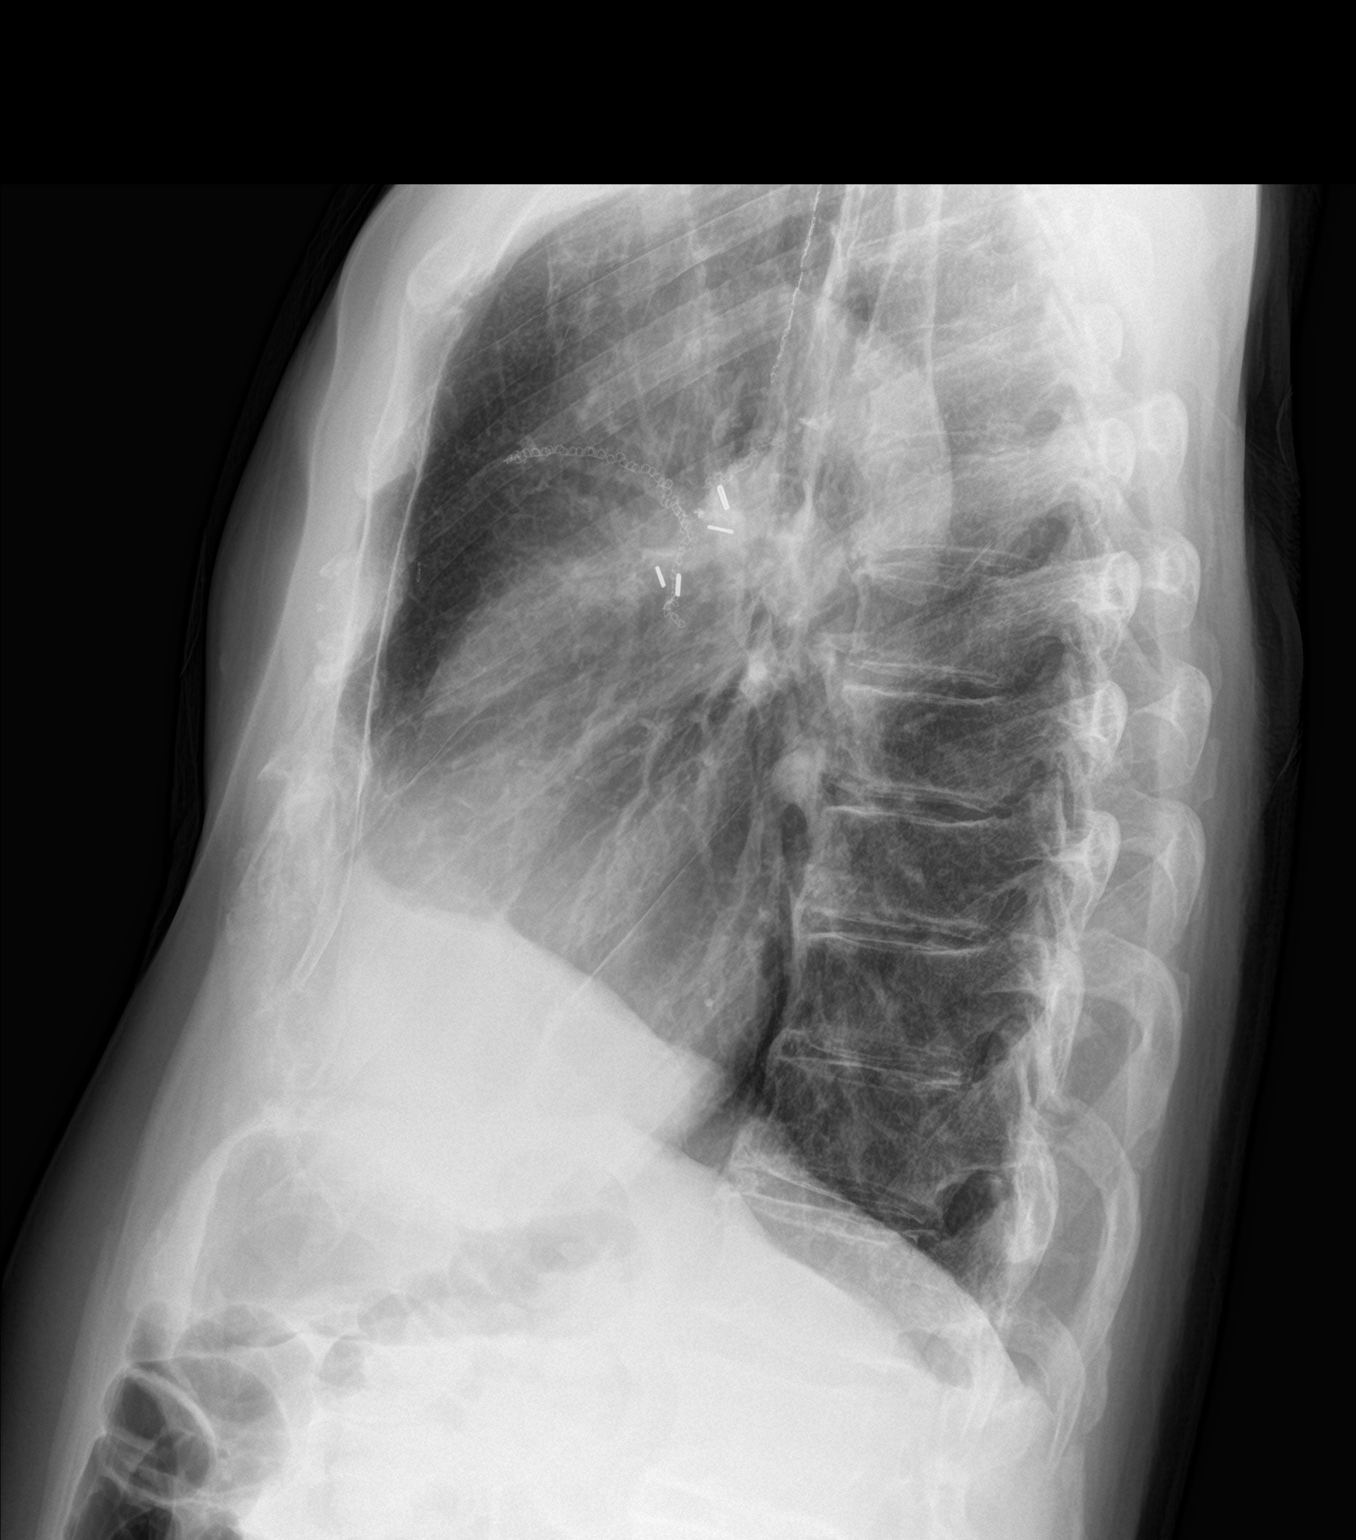

[chest pa]
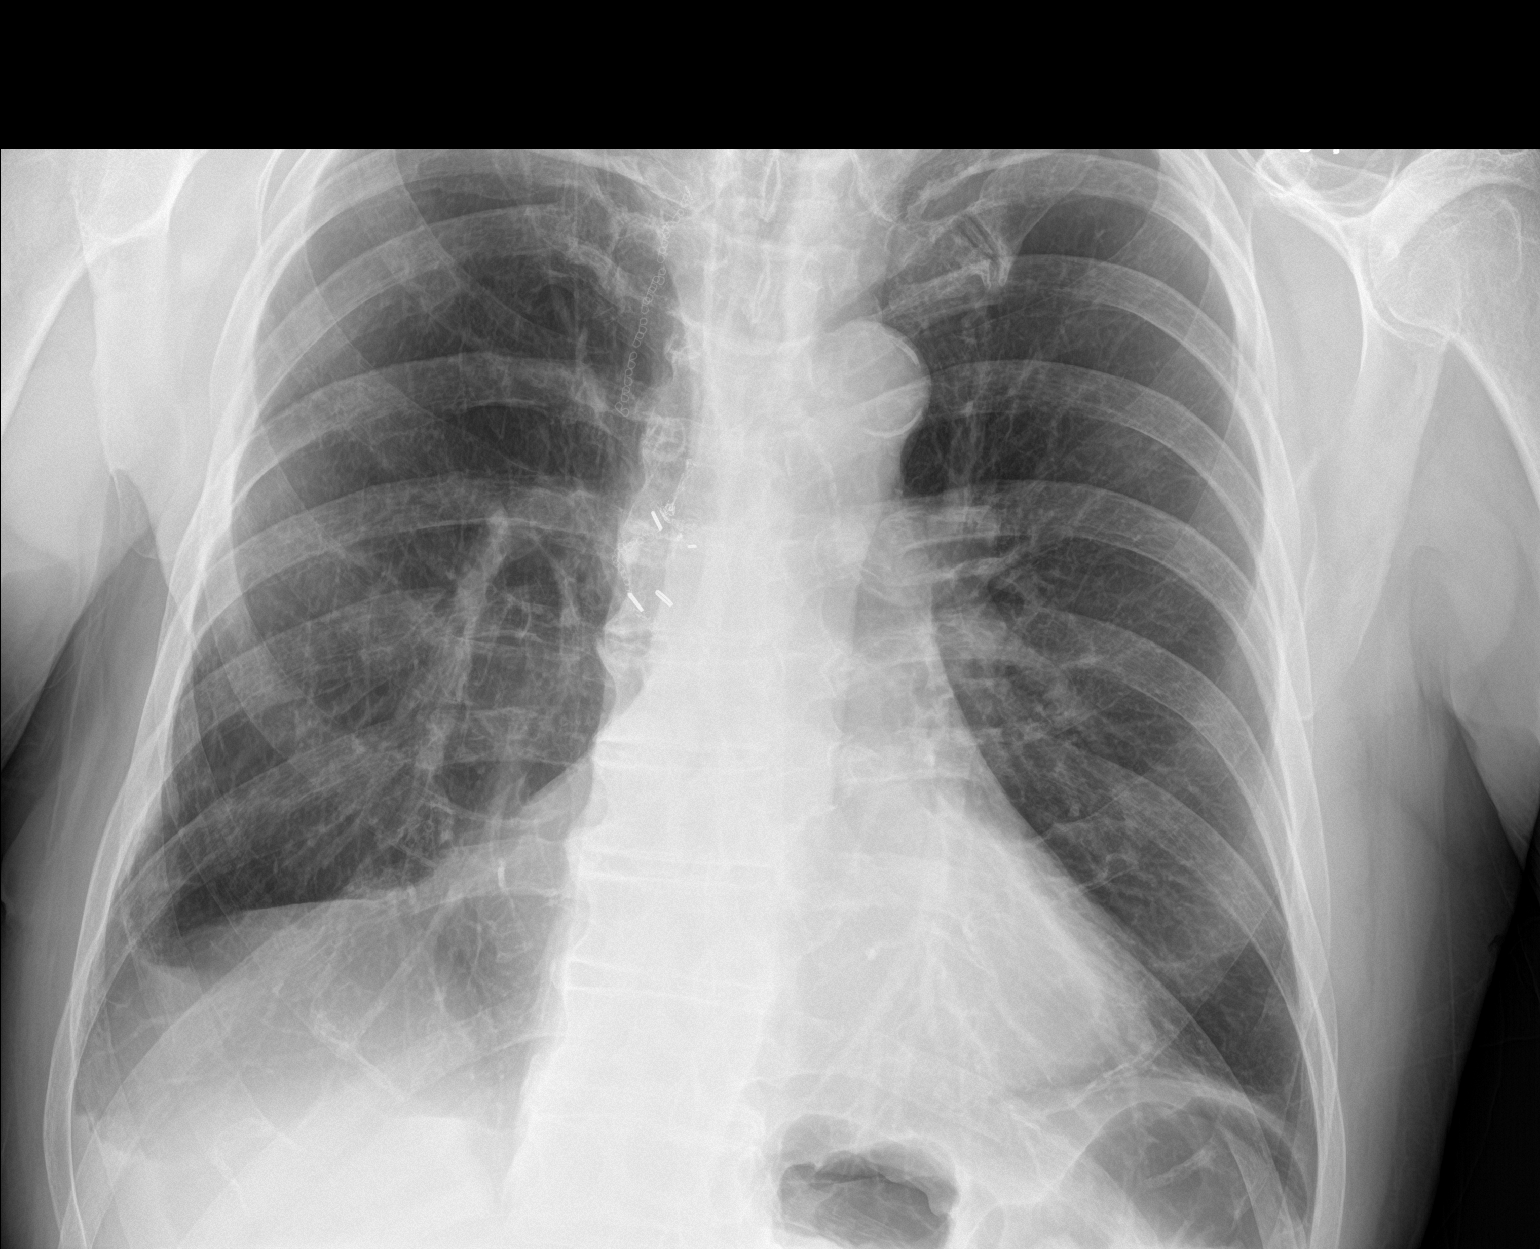

[2 of 2 positions shown; findings below may reference images not displayed]

FINDINGS: Mild elevation of the right hemidiaphragm, stable. Areas of scarring
in the right lower lung. Left lung is clear. Heart is normal size.
Postoperative changes on the right. No effusions or acute bony
abnormality.
IMPRESSION: Stable postoperative changes on the right.  No acute findings.

## 2017-11-06 DIAGNOSIS — R69 Illness, unspecified: Secondary | ICD-10-CM | POA: Diagnosis not present

## 2017-11-26 ENCOUNTER — Encounter: Payer: Self-pay | Admitting: Family Medicine

## 2017-11-26 ENCOUNTER — Other Ambulatory Visit: Payer: Self-pay

## 2017-11-26 ENCOUNTER — Encounter: Payer: Self-pay | Admitting: General Practice

## 2017-11-26 ENCOUNTER — Ambulatory Visit (INDEPENDENT_AMBULATORY_CARE_PROVIDER_SITE_OTHER): Payer: Medicare HMO | Admitting: Family Medicine

## 2017-11-26 VITALS — BP 119/68 | HR 59 | Temp 98.2°F | Resp 16 | Ht 72.0 in | Wt 175.5 lb

## 2017-11-26 DIAGNOSIS — E7800 Pure hypercholesterolemia, unspecified: Secondary | ICD-10-CM

## 2017-11-26 DIAGNOSIS — I712 Thoracic aortic aneurysm, without rupture, unspecified: Secondary | ICD-10-CM

## 2017-11-26 DIAGNOSIS — J479 Bronchiectasis, uncomplicated: Secondary | ICD-10-CM | POA: Diagnosis not present

## 2017-11-26 DIAGNOSIS — R151 Fecal smearing: Secondary | ICD-10-CM

## 2017-11-26 DIAGNOSIS — I429 Cardiomyopathy, unspecified: Secondary | ICD-10-CM | POA: Diagnosis not present

## 2017-11-26 LAB — BASIC METABOLIC PANEL
BUN: 19 mg/dL (ref 6–23)
CO2: 32 mEq/L (ref 19–32)
Calcium: 9.5 mg/dL (ref 8.4–10.5)
Chloride: 101 mEq/L (ref 96–112)
Creatinine, Ser: 0.91 mg/dL (ref 0.40–1.50)
GFR: 84.16 mL/min (ref >60.00)
Glucose, Bld: 98 mg/dL (ref 70–99)
Potassium: 4.5 mEq/L (ref 3.5–5.1)
Sodium: 140 mEq/L (ref 135–145)

## 2017-11-26 LAB — CBC WITH DIFFERENTIAL/PLATELET
Basophils Absolute: 0 10*3/uL (ref 0.0–0.1)
Basophils Relative: 0.6 % (ref 0.0–3.0)
Eosinophils Absolute: 0.1 10*3/uL (ref 0.0–0.7)
Eosinophils Relative: 1.9 % (ref 0.0–5.0)
HCT: 43.8 % (ref 39.0–52.0)
Hemoglobin: 15 g/dL (ref 13.0–17.0)
Lymphocytes Relative: 30.1 % (ref 12.0–46.0)
Lymphs Abs: 1.4 10*3/uL (ref 0.7–4.0)
MCHC: 34.2 g/dL (ref 30.0–36.0)
MCV: 91.4 fl (ref 78.0–100.0)
MONOS PCT: 11.8 % (ref 3.0–12.0)
Monocytes Absolute: 0.6 10*3/uL (ref 0.1–1.0)
NEUTROS PCT: 55.6 % (ref 43.0–77.0)
Neutro Abs: 2.6 10*3/uL (ref 1.4–7.7)
Platelets: 162 10*3/uL (ref 150.0–400.0)
RBC: 4.8 Mil/uL (ref 4.22–5.81)
RDW: 12.8 % (ref 11.5–15.5)
WBC: 4.7 10*3/uL (ref 4.0–10.5)

## 2017-11-26 LAB — LIPID PANEL
Cholesterol: 116 mg/dL (ref 0–200)
HDL: 45.7 mg/dL (ref >39.00)
LDL CALC: 55 mg/dL (ref 0–99)
NonHDL: 70.13
TRIGLYCERIDES: 75 mg/dL (ref 0.0–149.0)
Total CHOL/HDL Ratio: 3
VLDL: 15 mg/dL (ref 0.0–40.0)

## 2017-11-26 LAB — HEPATIC FUNCTION PANEL
ALK PHOS: 70 U/L (ref 39–117)
ALT: 16 U/L (ref 0–53)
AST: 19 U/L (ref 0–37)
Albumin: 4.3 g/dL (ref 3.5–5.2)
BILIRUBIN DIRECT: 0.2 mg/dL (ref 0.0–0.3)
TOTAL PROTEIN: 6.7 g/dL (ref 6.0–8.3)
Total Bilirubin: 0.9 mg/dL (ref 0.2–1.2)

## 2017-11-26 LAB — TSH: TSH: 1.87 u[IU]/mL (ref 0.35–4.50)

## 2017-11-26 NOTE — Assessment & Plan Note (Addendum)
Chronic problem.  On Lipitor 40mg  and Fish Oil daily w/ hx of good control.  Check labs and adjust meds prn.

## 2017-11-26 NOTE — Assessment & Plan Note (Signed)
Chronic problem.  Currently asymptomatic and well compensated.  Following w/ Dr Stanford Breed.  On beta blocker, ASA, and statin.  Will continue to follow.

## 2017-11-26 NOTE — Assessment & Plan Note (Signed)
Chronic problem.  Following w/ Dr Stanford Breed for yearly imaging

## 2017-11-26 NOTE — Patient Instructions (Signed)
Schedule your complete physical in 6 months We'll notify you of your lab results and make any changes if needed We'll call you with your GI appt for the bowel leakage Keep up the good work on healthy diet and regular exercise- you look great! Call with any questions or concerns Good Luck Tomorrow!!!

## 2017-11-26 NOTE — Assessment & Plan Note (Signed)
Following w/ Dr Melvyn Novas

## 2017-11-26 NOTE — Progress Notes (Signed)
   Subjective:    Patient ID: [name] [name], male    DOB: [birth year], 84 y.o.   MRN: [id number]  HPI Hyperlipidemia- chronic problem, on Lipitor 40mg  daily and Fish Oil daily.  Pt is down 5 lbs since last visit.  Pt reports he has added exercise to his daily regimen.  No abd pain, N/V.  Cardiomyopathy chronic problem, on metoprolol 25 mg daily w/ good control of BP.  Also on ASA and statin.  Denies CP, SOB above baseline, HAs, visual changes, edema  Bowel leakage- occurs w/ exercise, pt is never sure what is going to be 'dry gas vs wet gas'.  If no gas, no issues.  Pt eats a lot of fruit daily- no fiber supplement.   Review of Systems For ROS see HPI     Objective:   Physical Exam  Constitutional: He is oriented to person, place, and time. He appears well-developed and well-nourished. No distress.  HENT:  Head: Normocephalic and atraumatic.  Eyes: Pupils are equal, round, and reactive to light. Conjunctivae and EOM are normal.  Neck: Normal range of motion. Neck supple. No thyromegaly present.  Cardiovascular: Normal rate, regular rhythm, normal heart sounds and intact distal pulses.  No murmur heard. Pulmonary/Chest: Effort normal and breath sounds normal. No respiratory distress.  Abdominal: Soft. Bowel sounds are normal. He exhibits no distension.  Musculoskeletal: He exhibits no edema.  Lymphadenopathy:    He has no cervical adenopathy.  Neurological: He is alert and oriented to person, place, and time. No cranial nerve deficit.  Skin: Skin is warm and dry.  Psychiatric: He has a normal mood and affect. His behavior is normal.  Vitals reviewed.         Assessment & Plan:

## 2017-11-27 ENCOUNTER — Encounter: Payer: Self-pay | Admitting: Gastroenterology

## 2017-11-27 DIAGNOSIS — K219 Gastro-esophageal reflux disease without esophagitis: Secondary | ICD-10-CM | POA: Diagnosis not present

## 2017-11-27 DIAGNOSIS — Z7982 Long term (current) use of aspirin: Secondary | ICD-10-CM | POA: Diagnosis not present

## 2017-11-27 DIAGNOSIS — Z87891 Personal history of nicotine dependence: Secondary | ICD-10-CM | POA: Diagnosis not present

## 2017-11-27 DIAGNOSIS — H25012 Cortical age-related cataract, left eye: Secondary | ICD-10-CM | POA: Diagnosis not present

## 2017-11-27 DIAGNOSIS — E785 Hyperlipidemia, unspecified: Secondary | ICD-10-CM | POA: Diagnosis not present

## 2017-11-27 DIAGNOSIS — Z79899 Other long term (current) drug therapy: Secondary | ICD-10-CM | POA: Diagnosis not present

## 2017-11-27 DIAGNOSIS — H2512 Age-related nuclear cataract, left eye: Secondary | ICD-10-CM | POA: Diagnosis not present

## 2017-12-05 ENCOUNTER — Other Ambulatory Visit: Payer: Self-pay | Admitting: Family Medicine

## 2017-12-05 DIAGNOSIS — I10 Essential (primary) hypertension: Secondary | ICD-10-CM

## 2017-12-28 NOTE — Progress Notes (Signed)
HPI: FU palpitations, cardiomyopathy and family history of abdominal aortic aneurysm. Carotid dopplers 7/09 showed normal carotid arteries bilaterally. Myoview in September of 2013. Ejection fraction was 53% and the perfusion was normal. Last echo 7/18 showed normal LV function; TVP with moderate TR; small pericardial effusion; normal aortic root. Abdominal ultrasound 7/18 showed proximal abdominal aorta measuring 2.5 x 2.5 cm.. Since I last saw him the patient has dyspnea with more extreme activities but not with routine activities. It is relieved with rest. It is not associated with chest pain. There is no orthopnea, PND or pedal edema. There is no syncope or palpitations. There is no exertional chest pain.   Current Outpatient Medications  Medication Sig Dispense Refill  . aspirin 81 MG EC tablet Take 81 mg by mouth daily.      Marland Kitchen atorvastatin (LIPITOR) 40 MG tablet TAKE 1 TABLET (40 MG TOTAL) BY MOUTH DAILY AT 6 PM. 90 tablet 1  . Calcium Carbonate-Vitamin D (CALCIUM 600/VITAMIN D) 600-400 MG-UNIT per tablet Take 1 tablet by mouth 2 (two) times daily.      . carboxymethylcellulose (RETAINE CMC) 0.5 % SOLN 1 drop 3 (three) times daily as needed.    Marland Kitchen co-enzyme Q-10 30 MG capsule Take 30 mg by mouth 2 (two) times daily.      . fish oil-omega-3 fatty acids 1000 MG capsule Take 1 g by mouth daily. Nordic naturals omega 3 TID    . fluticasone (FLONASE) 50 MCG/ACT nasal spray USE 2 SPRAYS IN EACH NOSTRIL DAILY. 48 g 1  . metoprolol succinate (TOPROL-XL) 25 MG 24 hr tablet TAKE 1/2 TABLET DAILY 45 tablet 1  . Multiple Vitamins-Minerals (VISION-VITE PRESERVE PO) Take by mouth. 2 per day    . NON FORMULARY HYDROCTISONE BUT 0.1% CREAM 45GM PRN    . Probiotic Product (PROBIOTIC DAILY PO) Take by mouth.    . TURMERIC PO Take 2,000 mg by mouth daily.     No current facility-administered medications for this visit.      Past Medical History:  Diagnosis Date  . Benign tumor of lung   . BPH  (benign prostatic hyperplasia)   . Cardiomyopathy ([place])   . Cataract   . Dilated aortic root ([facility])    Hx of mild  . Hyperlipidemia   . Mitral valve prolapse    irregular heart beat  . Palpitations   . Pure hypercholesterolemia   . RBBB (right bundle branch block)     Past Surgical History:  Procedure Laterality Date  . COSMETIC SURGERY    . Excision on neck     Excision of 2 cm soft tissue mass of left neck  . EYE SURGERY    . HERNIA REPAIR    . Resection of Lung     Hx of, right lung secondary to benign tumor  . SHOULDER ARTHROSCOPY     Lt  . testicular hernia    . VASECTOMY      Social History   Socioeconomic History  . Marital status: Married    Spouse name: Not on file  . Number of children: 1  . Years of education: Not on file  . Highest education level: Not on file  Occupational History  . Occupation: [employer]  . Financial resource strain: Not on file  . Food insecurity:    Worry: Not on file    Inability: Not on file  . Transportation needs:    Medical: Not on file  Non-medical: Not on file  Tobacco Use  . Smoking status: Former Research scientist (life sciences)  . Smokeless tobacco: Never Used  . Tobacco comment: quit 80's  Substance and Sexual Activity  . Alcohol use: No    Alcohol/week: 0.0 oz  . Drug use: No  . Sexual activity: Never  Lifestyle  . Physical activity:    Days per week: Not on file    Minutes per session: Not on file  . Stress: Not on file  Relationships  . Social connections:    Talks on phone: Not on file    Gets together: Not on file    Attends religious service: Not on file    Active member of club or organization: Not on file    Attends meetings of clubs or organizations: Not on file    Relationship status: Not on file  . Intimate partner violence:    Fear of current or ex partner: Not on file    Emotionally abused: Not on file    Physically abused: Not on file    Forced sexual activity: Not on file  Other Topics Concern  .  Not on file  Social History Narrative   Exercise 20-30 min walks. Patient is married. Education: [employer].     Family History  Problem Relation Age of Onset  . Aortic aneurysm Father   . Heart disease Father   . Hypertension Father   . Hyperlipidemia Father   . Colon cancer Sister 61  . Cancer Sister   . Cancer Sister   . Hyperlipidemia Unknown   . Esophageal cancer Neg Hx   . Stomach cancer Neg Hx   . Rectal cancer Neg Hx     ROS: no fevers or chills, productive cough, hemoptysis, dysphasia, odynophagia, melena, hematochezia, dysuria, hematuria, rash, seizure activity, orthopnea, PND, pedal edema, claudication. Remaining systems are negative.  Physical Exam: Well-developed well-nourished in no acute distress.  Skin is warm and dry.  HEENT is normal.  Neck is supple.  Chest is clear to auscultation with normal expansion.  Cardiovascular exam is regular rate and rhythm.  Abdominal exam nontender or distended. No masses palpated. Extremities show no edema. neuro grossly intact  ECG-sinus bradycardia at a rate of 56.  Right bundle branch block.  Cannot rule out prior septal infarct.  Personally reviewed  [name]  1 cardiomyopathy-LV function has normalized on most recent echocardiogram.  We will continue present dose of beta-blocker.  2 history of mildly dilated aortic root-no evidence of aneurysm on most recent echocardiogram.  3 palpitations-symptoms are controlled at present.  Continue beta-blocker.  4 hyperlipidemia-continue statin.  Lipids and liver are monitored by primary care.   5 moderate tricuspid regurgitation-conservative measures if possible given patient's age.  Kirk Ruths, MD

## 2018-01-01 ENCOUNTER — Ambulatory Visit: Payer: Medicare HMO | Admitting: Cardiology

## 2018-01-01 ENCOUNTER — Encounter: Payer: Self-pay | Admitting: Cardiology

## 2018-01-01 VITALS — BP 120/74 | HR 56 | Ht 73.0 in | Wt 176.2 lb

## 2018-01-01 DIAGNOSIS — I10 Essential (primary) hypertension: Secondary | ICD-10-CM

## 2018-01-01 DIAGNOSIS — I77810 Thoracic aortic ectasia: Secondary | ICD-10-CM

## 2018-01-01 DIAGNOSIS — I420 Dilated cardiomyopathy: Secondary | ICD-10-CM

## 2018-01-01 DIAGNOSIS — R002 Palpitations: Secondary | ICD-10-CM

## 2018-01-01 MED ORDER — METOPROLOL SUCCINATE ER 25 MG PO TB24: 12.5000 mg | ORAL_TABLET | Freq: Every day | ORAL | 3 refills | Status: DC

## 2018-01-01 NOTE — Patient Instructions (Signed)
Your physician wants you to follow-up in: ONE YEAR WITH DR CRENSHAW You will receive a reminder letter in the mail two months in advance. If you don't receive a letter, please call our office to schedule the follow-up appointment.   If you need a refill on your cardiac medications before your next appointment, please call your pharmacy.  

## 2018-01-03 DIAGNOSIS — H353133 Nonexudative age-related macular degeneration, bilateral, advanced atrophic without subfoveal involvement: Secondary | ICD-10-CM | POA: Diagnosis not present

## 2018-01-03 DIAGNOSIS — H3540 Unspecified peripheral retinal degeneration: Secondary | ICD-10-CM | POA: Diagnosis not present

## 2018-01-03 DIAGNOSIS — H2511 Age-related nuclear cataract, right eye: Secondary | ICD-10-CM | POA: Diagnosis not present

## 2018-01-03 DIAGNOSIS — H43823 Vitreomacular adhesion, bilateral: Secondary | ICD-10-CM | POA: Diagnosis not present

## 2018-01-28 ENCOUNTER — Ambulatory Visit: Payer: Medicare HMO | Admitting: Gastroenterology

## 2018-01-28 ENCOUNTER — Encounter: Payer: Self-pay | Admitting: Gastroenterology

## 2018-01-28 VITALS — BP 110/70 | HR 68 | Ht 71.25 in | Wt 178.4 lb

## 2018-01-28 DIAGNOSIS — R151 Fecal smearing: Secondary | ICD-10-CM | POA: Diagnosis not present

## 2018-01-28 NOTE — Patient Instructions (Addendum)
One tablespoon of citrucel fiber (generic is fine)  in a glass of water or juice once daily.  If you are age 82 or older, your body mass index should be between 23-30. Your Body mass index is 24.7 kg/m. If this is out of the aforementioned range listed, please consider follow up with your Primary Care Provider.  If you are age 98 or younger, your body mass index should be between 19-25. Your Body mass index is 24.7 kg/m. If this is out of the aformentioned range listed, please consider follow up with your Primary Care Provider.   Follow up as needed. [phone]  It was a pleasure to see you today!  Dr. Loletha Carrow

## 2018-01-28 NOTE — Progress Notes (Signed)
[facility] Consult Note:  History: [name] [name] 01/28/2018  Referring physician: Midge Minium, MD  Reason for consult/chief complaint: Gas and Encopresis (sometimes liquid comes out when passing gas)   Subjective  HPI:  This is a very pleasant 84 year old man referred by primary care for about a year of intermittent fecal incontinence.  At times, with exercise or when he tries to pass gas, he may have the passage of a small amount of liquid stool.  He generally has a formed stool every day.  It sounds like at times the stool was a little firm and he felt incompletely evacuated, so he was taking a probiotic.  This seemed to make the problem worse.  He denies rectal bleeding, dyschezia, abdominal pain, anorexia or weight loss.   ROS:  Review of Systems  Constitutional: Positive for fatigue. Negative for appetite change and unexpected weight change.  HENT: Negative for mouth sores and voice change.   Eyes: Negative for pain and redness.  Respiratory: Negative for cough and shortness of breath.   Cardiovascular: Negative for chest pain and palpitations.  Genitourinary: Negative for dysuria and hematuria.  Musculoskeletal: Positive for arthralgias. Negative for myalgias.  Skin: Negative for pallor and rash.  Neurological: Negative for weakness and headaches.  Hematological: Negative for adenopathy.     Past Medical History: Past Medical History:  Diagnosis Date  . Benign tumor of lung   . BPH (benign prostatic hyperplasia)   . Cardiomyopathy ([place])   . Cataract   . Dilated aortic root (HCC)    Hx of mild  . Hyperlipidemia   . Mitral valve prolapse    irregular heart beat  . Palpitations   . Pure hypercholesterolemia   . RBBB (right bundle branch block)      Past Surgical History: Past Surgical History:  Procedure Laterality Date  . BLEPHAROPLASTY Bilateral   . CATARACT EXTRACTION, BILATERAL    . COSMETIC SURGERY    . Excision on  neck     Excision of 2 cm soft tissue mass of left neck  . INGUINAL HERNIA REPAIR Bilateral   . Resection of Lung Right    Hx of, right lung secondary to benign tumor  . SHOULDER ARTHROSCOPY Left   . testicular hernia    . VASECTOMY       Family History: Family History  Problem Relation Age of Onset  . Aortic aneurysm Father   . Heart disease Father   . Hypertension Father   . Hyperlipidemia Father   . Lung disease Sister   . Breast cancer Sister   . Pancreatic cancer Mother   . Hyperlipidemia Unknown   . Colon cancer Sister   . Breast cancer Sister   . Colon polyps Sister   . Esophageal cancer Neg Hx   . Stomach cancer Neg Hx   . Rectal cancer Neg Hx     Social History: Social History   Socioeconomic History  . Marital status: Married    Spouse name: Not on file  . Number of children: 1  . Years of education: Not on file  . Highest education level: Not on file  Occupational History  . Occupation: Farmer-retired  Social Needs  . Financial resource strain: Not on file  . Food insecurity:    Worry: Not on file    Inability: Not on file  . Transportation needs:    Medical: Not on file    Non-medical: Not on file  Tobacco Use  .  Smoking status: Former Smoker    Types: Cigarettes    Last attempt to quit: 1982    Years since quitting: 37.6  . Smokeless tobacco: Never Used  . Tobacco comment: quit 80's  Substance and Sexual Activity  . Alcohol use: No    Alcohol/week: 0.0 standard drinks  . Drug use: No  . Sexual activity: Never  Lifestyle  . Physical activity:    Days per week: Not on file    Minutes per session: Not on file  . Stress: Not on file  Relationships  . Social connections:    Talks on phone: Not on file    Gets together: Not on file    Attends religious service: Not on file    Active member of club or organization: Not on file    Attends meetings of clubs or organizations: Not on file    Relationship status: Not on file  Other Topics  Concern  . Not on file  Social History Narrative   Exercise 20-30 min walks. Patient is married. Education: [employer].     Allergies: No Known Allergies  Outpatient Meds: Current Outpatient Medications  Medication Sig Dispense Refill  . aspirin 81 MG EC tablet Take 81 mg by mouth daily.      Marland Kitchen atorvastatin (LIPITOR) 40 MG tablet TAKE 1 TABLET (40 MG TOTAL) BY MOUTH DAILY AT 6 PM. 90 tablet 1  . Calcium Carbonate-Vitamin D (CALCIUM 600/VITAMIN D) 600-400 MG-UNIT per tablet Take 1 tablet by mouth 2 (two) times daily.      . carboxymethylcellulose (RETAINE CMC) 0.5 % SOLN 1 drop 3 (three) times daily as needed.    Marland Kitchen co-enzyme Q-10 30 MG capsule Take 30 mg by mouth 2 (two) times daily.      . fish oil-omega-3 fatty acids 1000 MG capsule Take 1 g by mouth daily. Nordic naturals omega 3 TID    . fluticasone (FLONASE) 50 MCG/ACT nasal spray USE 2 SPRAYS IN EACH NOSTRIL DAILY. 48 g 1  . metoprolol succinate (TOPROL-XL) 25 MG 24 hr tablet Take 0.5 tablets (12.5 mg total) by mouth daily. 45 tablet 3  . Multiple Vitamins-Minerals (VISION-VITE PRESERVE PO) Take by mouth. 2 per day    . NON FORMULARY HYDROCTISONE BUT 0.1% CREAM 45GM PRN    . TURMERIC PO Take 2,000 mg by mouth daily.    . Probiotic Product (PROBIOTIC DAILY PO) Take by mouth.     No current facility-administered medications for this visit.       ___________________________________________________________________ Objective   Exam:  BP 110/70 (BP Location: Left Arm, Patient Position: Sitting, Cuff Size: Normal)   Pulse 68   Ht 5' 11.25" (1.81 m) Comment: height measured without shoes  Wt 178 lb 6 oz (80.9 kg)   BMI 24.70 kg/m    General: this is a(n) well-appearing elderly man.  Speech slow but fluent.  Gets on exam table slowly but without assistance.  Eyes: sclera anicteric, no redness  ENT: oral mucosa moist without lesions, no cervical or supraclavicular lymphadenopathy, good dentition  CV: RRR without murmur,  S1/S2, no JVD, no peripheral edema  Resp: clear to auscultation bilaterally, normal RR and effort noted  GI: soft, no tenderness, with active bowel sounds. No guarding or palpable organomegaly noted.  Skin; warm and dry, no rash or jaundice noted  Neuro: awake, alert and oriented x 3. Normal gross motor function and fluent speech Rectal: Normal external, decreased resting anal sphincter tone, normal voluntary tone.  No anal or  rectal tenderness, no palpable internal lesions.  Scant formed stool in rectal vault.  No data for review  Assessment and Plan: Encounter Diagnosis  Name Primary?  . Fecal smearing Yes    Intermittent incontinence of liquid stool.  He has altered anal sphincter tone and decreased sensitivity to rectal contents.  I do not think he needs a colonoscopy or other testing.  I recommended a daily fiber supplement to help bulk the stool and get better continence, toilet regimen at mealtimes, and [place] exercises.  See me as needed.  Thank you for the courtesy of this consult.  Please call me with any questions or concerns.  Nelida Meuse III  CC: Midge Minium, MD

## 2018-02-18 DIAGNOSIS — R69 Illness, unspecified: Secondary | ICD-10-CM | POA: Diagnosis not present

## 2018-02-27 NOTE — Progress Notes (Addendum)
Subjective:   [name] [name] is a 84 y.o. male who presents for Medicare Annual/Subsequent preventive examination.  Review of Systems:  No ROS.  Medicare Wellness Visit. Additional risk factors are reflected in the social history.  Cardiac Risk Factors include: advanced age (>66men, >25 women);male gender;dyslipidemia;family history of premature cardiovascular disease   Sleep patterns: Sleeps 6-7 hours.  Home Safety/Smoke Alarms: Feels safe in home. Smoke alarms in place.  Living environment; residence and Firearm Safety: Lives with wife in 2 story home.  Seat Belt Safety/Bike Helmet: Wears seat belt.  Male:   CCS-Colonoscopy 09/03/2012, normal. No recall d/t age.      PSA-  Lab Results  Component Value Date   PSA 0.30 05/28/2017   PSA 0.47 10/21/2015   PSA 0.53 09/29/2014       Objective:    Vitals: BP 132/70 (BP Location: Left Arm, Patient Position: Sitting, Cuff Size: Normal)   Pulse (!) 51   Ht 5\' 11"  (1.803 m)   Wt 180 lb (81.6 kg)   SpO2 98%   BMI 25.10 kg/m   Body mass index is 25.1 kg/m.  Advanced Directives 02/28/2018 09/20/2017 05/28/2017 02/22/2017 10/26/2014  Does Patient Have a Medical Advance Directive? Yes No Yes Yes Yes  Type of Paramedic of [place];Living will - Healthcare Power of [place] Living will [place];Living will  Copy of [place] in Chart? Yes - - - -  Would patient like information on creating a medical advance directive? - No - Patient declined - - -    Tobacco Social History   Tobacco Use  Smoking Status Former Smoker  . Types: Cigarettes  . Last attempt to quit: 1982  . Years since quitting: 37.7  Smokeless Tobacco Never Used  Tobacco Comment   quit 80's     Counseling given: Not Answered Comment: quit 80's     Past Medical History:  Diagnosis Date  . Benign tumor of lung   . BPH (benign prostatic hyperplasia)   . Cardiomyopathy ([place])   . Cataract   .  Dilated aortic root (HCC)    Hx of mild  . Hyperlipidemia   . Mitral valve prolapse    irregular heart beat  . Palpitations   . Pure hypercholesterolemia   . RBBB (right bundle branch block)    Past Surgical History:  Procedure Laterality Date  . BLEPHAROPLASTY Bilateral   . CATARACT EXTRACTION, BILATERAL    . COSMETIC SURGERY    . Excision on neck     Excision of 2 cm soft tissue mass of left neck  . INGUINAL HERNIA REPAIR Bilateral   . Resection of Lung Right    Hx of, right lung secondary to benign tumor  . SHOULDER ARTHROSCOPY Left   . testicular hernia    . VASECTOMY     Family History  Problem Relation Age of Onset  . Aortic aneurysm Father   . Heart disease Father   . Hypertension Father   . Hyperlipidemia Father   . Lung disease Sister   . Breast cancer Sister   . Pancreatic cancer Mother   . Hyperlipidemia Unknown   . Colon cancer Sister   . Breast cancer Sister   . Colon polyps Sister   . Esophageal cancer Neg Hx   . Stomach cancer Neg Hx   . Rectal cancer Neg Hx    Social History   Socioeconomic History  . Marital status: Married  Spouse name: Not on file  . Number of children: 1  . Years of education: Not on file  . Highest education level: Not on file  Occupational History  . Occupation: Farmer-retired  Social Needs  . Financial resource strain: Not on file  . Food insecurity:    Worry: Not on file    Inability: Not on file  . Transportation needs:    Medical: Not on file    Non-medical: Not on file  Tobacco Use  . Smoking status: Former Smoker    Types: Cigarettes    Last attempt to quit: 1982    Years since quitting: 37.7  . Smokeless tobacco: Never Used  . Tobacco comment: quit 80's  Substance and Sexual Activity  . Alcohol use: No    Alcohol/week: 0.0 standard drinks  . Drug use: No  . Sexual activity: Never  Lifestyle  . Physical activity:    Days per week: Not on file    Minutes per session: Not on file  . Stress: Not on  file  Relationships  . Social connections:    Talks on phone: Not on file    Gets together: Not on file    Attends religious service: Not on file    Active member of club or organization: Not on file    Attends meetings of clubs or organizations: Not on file    Relationship status: Not on file  Other Topics Concern  . Not on file  Social History Narrative   Exercise 20-30 min walks. Patient is married. Education: [employer].     Outpatient Encounter Medications as of 02/28/2018  Medication Sig  . aspirin 81 MG EC tablet Take 81 mg by mouth daily.    Marland Kitchen atorvastatin (LIPITOR) 40 MG tablet TAKE 1 TABLET (40 MG TOTAL) BY MOUTH DAILY AT 6 PM.  . Calcium Carbonate-Vitamin D (CALCIUM 600/VITAMIN D) 600-400 MG-UNIT per tablet Take 1 tablet by mouth 2 (two) times daily.    . carboxymethylcellulose (RETAINE CMC) 0.5 % SOLN 1 drop 3 (three) times daily as needed.  Marland Kitchen co-enzyme Q-10 30 MG capsule Take 30 mg by mouth 2 (two) times daily.    . fish oil-omega-3 fatty acids 1000 MG capsule Take 1 g by mouth daily. Nordic naturals omega 3 TID  . fluticasone (FLONASE) 50 MCG/ACT nasal spray USE 2 SPRAYS IN EACH NOSTRIL DAILY.  . metoprolol succinate (TOPROL-XL) 25 MG 24 hr tablet Take 0.5 tablets (12.5 mg total) by mouth daily.  . Multiple Vitamins-Minerals (VISION-VITE PRESERVE PO) Take by mouth. 2 per day  . NON FORMULARY HYDROCTISONE BUT 0.1% CREAM 45GM PRN  . TURMERIC PO Take 2,000 mg by mouth daily.  . Wheat Dextrin (EQ FIBER POWDER PO) Take by mouth.  . [DISCONTINUED] Probiotic Product (PROBIOTIC DAILY PO) Take by mouth.   No facility-administered encounter medications on file as of 02/28/2018.     Activities of Daily Living In your present state of health, do you have any difficulty performing the following activities: 02/28/2018 11/26/2017  Hearing? N N  Vision? N Y  Comment - cataracts  Difficulty concentrating or making decisions? N N  Walking or climbing stairs? N N  Dressing or bathing? N  N  Doing errands, shopping? N N  Preparing Food and eating ? N -  Using the Toilet? N -  In the past six months, have you accidently leaked urine? N -  Do you have problems with loss of bowel control? N -  Managing your Medications?  N -  Managing your Finances? N -  Housekeeping or managing your Housekeeping? N -  Some recent data might be hidden    Patient Care Team: Midge Minium, MD as PCP - General (Family Medicine) Stanford Breed Denice Bors, MD (Cardiology) Kathie Rhodes, MD (Urology) Tanda Rockers, MD as Consulting Physician (Pulmonary Disease) Allyn Kenner, MD (Dermatology) Loletha Carrow Kirke Corin, MD as Consulting Physician (Gastroenterology)   Assessment:   This is a routine wellness examination for [place].  Exercise Activities and Dietary recommendations Current Exercise Habits: Home exercise routine, Type of exercise: strength training/weights;stretching;walking, Time (Minutes): 20, Frequency (Times/Week): 7, Weekly Exercise (Minutes/Week): 140, Exercise limited by: None identified   Diet (meal preparation, eat out, water intake, caffeinated beverages, dairy products, fruits and vegetables): Drinks coffee, tea and water.   Breakfast: fruit; cereal; cheese toast; coffee Lunch: protein and vegetables.  Dinner: Fruit  Goals      Patient Stated   . patient (pt-stated)     Maintain current health by staying active.        Fall Risk Fall Risk  02/28/2018 11/26/2017 09/24/2017 09/20/2017 05/28/2017  Falls in the past year? No No No No No     Depression Screen PHQ 2/9 Scores 02/28/2018 11/26/2017 09/24/2017 09/22/2017  PHQ - 2 Score 0 0 0 0  PHQ- 9 Score - 0 - -    Cognitive Function MMSE - Mini Mental State Exam 02/28/2018  Orientation to time 3  Orientation to Place 5  Registration 3  Attention/ Calculation 3  Recall 0  Language- name 2 objects 2  Language- repeat 1  Language- follow 3 step command 3  Language- read & follow direction 1  Write a sentence 1  Copy  design 1  Total score 23        Immunization History  Administered Date(s) Administered  . Influenza Split 06/05/2009, 02/22/2013, 02/26/2015  . Influenza, High Dose Seasonal PF 03/02/2014, 02/18/2018  . Influenza-Unspecified 02/01/2016, 01/03/2017  . Pneumococcal Conjugate-13 12/09/2013  . Pneumococcal Polysaccharide-23 06/05/2004, 08/28/2016  . Td 09/21/2009  . Zoster 06/05/2008     Screening Tests Health Maintenance  Topic Date Due  . TETANUS/TDAP  09/22/2019  . INFLUENZA VACCINE  Completed  . PNA vac Low Risk Adult  Completed        Plan:    Continue doing brain stimulating activities (puzzles, reading, adult coloring books, staying active) to keep memory sharp.   I have personally reviewed and noted the following in the patient's chart:   . Medical and social history . Use of alcohol, tobacco or illicit drugs  . Current medications and supplements . Functional ability and status . Nutritional status . Physical activity . Advanced directives . List of other physicians . Hospitalizations, surgeries, and ER visits in previous 12 months . Vitals . Screenings to include cognitive, depression, and falls . Referrals and appointments  In addition, I have reviewed and discussed with patient certain preventive protocols, quality metrics, and best practice recommendations. A written personalized care plan for preventive services as well as general preventive health recommendations were provided to patient.     Gerilyn Nestle, RN  02/28/2018  PCP Notes: -23/30 MMSE -F/U with PCP 06/04/18 (CPE)  Reviewed documentation provided by RN and agree w/ above.  Annye Asa, MD

## 2018-02-28 ENCOUNTER — Ambulatory Visit (INDEPENDENT_AMBULATORY_CARE_PROVIDER_SITE_OTHER): Payer: Medicare HMO

## 2018-02-28 ENCOUNTER — Other Ambulatory Visit: Payer: Self-pay

## 2018-02-28 VITALS — BP 132/70 | HR 51 | Ht 71.0 in | Wt 180.0 lb

## 2018-02-28 DIAGNOSIS — Z0000 Encounter for general adult medical examination without abnormal findings: Secondary | ICD-10-CM | POA: Diagnosis not present

## 2018-02-28 NOTE — Patient Instructions (Addendum)
Continue doing brain stimulating activities (puzzles, reading, adult coloring books, staying active) to keep memory sharp.   Health Maintenance, Male A healthy lifestyle and preventive care is important for your health and wellness. Ask your health care provider about what schedule of regular examinations is right for you. What should I know about weight and diet? Eat a Healthy Diet  Eat plenty of vegetables, fruits, whole grains, low-fat dairy products, and lean protein.  Do not eat a lot of foods high in solid fats, added sugars, or salt.  Maintain a Healthy Weight Regular exercise can help you achieve or maintain a healthy weight. You should:  Do at least 150 minutes of exercise each week. The exercise should increase your heart rate and make you sweat (moderate-intensity exercise).  Do strength-training exercises at least twice a week.  Watch Your Levels of Cholesterol and Blood Lipids  Have your blood tested for lipids and cholesterol every 5 years starting at 82 years of age. If you are at high risk for heart disease, you should start having your blood tested when you are 82 years old. You may need to have your cholesterol levels checked more often if: ? Your lipid or cholesterol levels are high. ? You are older than 82 years of age. ? You are at high risk for heart disease.  What should I know about cancer screening? Many types of cancers can be detected early and may often be prevented. Lung Cancer  You should be screened every year for lung cancer if: ? You are a current smoker who has smoked for at least 30 years. ? You are a former smoker who has quit within the past 15 years.  Talk to your health care provider about your screening options, when you should start screening, and how often you should be screened.  Colorectal Cancer  Routine colorectal cancer screening usually begins at 82 years of age and should be repeated every 5-10 years until you are 82 years old. You  may need to be screened more often if early forms of precancerous polyps or small growths are found. Your health care provider may recommend screening at an earlier age if you have risk factors for colon cancer.  Your health care provider may recommend using home test kits to check for hidden blood in the stool.  A small camera at the end of a tube can be used to examine your colon (sigmoidoscopy or colonoscopy). This checks for the earliest forms of colorectal cancer.  Prostate and Testicular Cancer  Depending on your age and overall health, your health care provider may do certain tests to screen for prostate and testicular cancer.  Talk to your health care provider about any symptoms or concerns you have about testicular or prostate cancer.  Skin Cancer  Check your skin from head to toe regularly.  Tell your health care provider about any new moles or changes in moles, especially if: ? There is a change in a mole's size, shape, or color. ? You have a mole that is larger than a pencil eraser.  Always use sunscreen. Apply sunscreen liberally and repeat throughout the day.  Protect yourself by wearing long sleeves, pants, a wide-brimmed hat, and sunglasses when outside.  What should I know about heart disease, diabetes, and high blood pressure?  If you are 18-39 years of age, have your blood pressure checked every 3-5 years. If you are 40 years of age or older, have your blood pressure checked every year. You   should have your blood pressure measured twice-once when you are at a hospital or clinic, and once when you are not at a hospital or clinic. Record the average of the two measurements. To check your blood pressure when you are not at a hospital or clinic, you can use: ? An automated blood pressure machine at a pharmacy. ? A home blood pressure monitor.  Talk to your health care provider about your target blood pressure.  If you are between 45-79 years old, ask your health care  provider if you should take aspirin to prevent heart disease.  Have regular diabetes screenings by checking your fasting blood sugar level. ? If you are at a normal weight and have a low risk for diabetes, have this test once every three years after the age of 45. ? If you are overweight and have a high risk for diabetes, consider being tested at a younger age or more often.  A one-time screening for abdominal aortic aneurysm (AAA) by ultrasound is recommended for men aged 65-75 years who are current or former smokers. What should I know about preventing infection? Hepatitis B If you have a higher risk for hepatitis B, you should be screened for this virus. Talk with your health care provider to find out if you are at risk for hepatitis B infection. Hepatitis C Blood testing is recommended for:  Everyone born from 1945 through 1965.  Anyone with known risk factors for hepatitis C.  Sexually Transmitted Diseases (STDs)  You should be screened each year for STDs including gonorrhea and chlamydia if: ? You are sexually active and are younger than 82 years of age. ? You are older than 82 years of age and your health care provider tells you that you are at risk for this type of infection. ? Your sexual activity has changed since you were last screened and you are at an increased risk for chlamydia or gonorrhea. Ask your health care provider if you are at risk.  Talk with your health care provider about whether you are at high risk of being infected with HIV. Your health care provider may recommend a prescription medicine to help prevent HIV infection.  What else can I do?  Schedule regular health, dental, and eye exams.  Stay current with your vaccines (immunizations).  Do not use any tobacco products, such as cigarettes, chewing tobacco, and e-cigarettes. If you need help quitting, ask your health care provider.  Limit alcohol intake to no more than 2 drinks per day. One drink equals 12  ounces of beer, 5 ounces of wine, or 1 ounces of hard liquor.  Do not use street drugs.  Do not share needles.  Ask your health care provider for help if you need support or information about quitting drugs.  Tell your health care provider if you often feel depressed.  Tell your health care provider if you have ever been abused or do not feel safe at home. This information is not intended to replace advice given to you by your health care provider. Make sure you discuss any questions you have with your health care provider. Document Released: 11/18/2007 Document Revised: 01/19/2016 Document Reviewed: 02/23/2015 Elsevier Interactive Patient Education  2018 [employer]  

## 2018-03-17 ENCOUNTER — Other Ambulatory Visit: Payer: Self-pay | Admitting: Family Medicine

## 2018-03-17 DIAGNOSIS — E785 Hyperlipidemia, unspecified: Secondary | ICD-10-CM

## 2018-06-04 ENCOUNTER — Other Ambulatory Visit: Payer: Self-pay

## 2018-06-04 ENCOUNTER — Encounter: Payer: Self-pay | Admitting: Family Medicine

## 2018-06-04 ENCOUNTER — Ambulatory Visit (INDEPENDENT_AMBULATORY_CARE_PROVIDER_SITE_OTHER): Payer: Medicare HMO | Admitting: Family Medicine

## 2018-06-04 VITALS — BP 126/72 | HR 60 | Temp 98.2°F | Resp 16 | Ht 71.0 in | Wt 180.0 lb

## 2018-06-04 DIAGNOSIS — Z125 Encounter for screening for malignant neoplasm of prostate: Secondary | ICD-10-CM

## 2018-06-04 DIAGNOSIS — Z0000 Encounter for general adult medical examination without abnormal findings: Secondary | ICD-10-CM | POA: Diagnosis not present

## 2018-06-04 DIAGNOSIS — E7800 Pure hypercholesterolemia, unspecified: Secondary | ICD-10-CM

## 2018-06-04 NOTE — Progress Notes (Signed)
   Subjective:    Patient ID: [name] [name], male    DOB: Apr 28, 1931, 84 y.o.   MRN: [id number]  HPI CPE- UTD on colonoscopy, immunizations.  No concerns.   Review of Systems Patient reports no vision/hearing changes, anorexia, fever ,adenopathy, persistant/recurrent hoarseness, swallowing issues, chest pain, palpitations, edema, persistant/recurrent cough, hemoptysis, dyspnea (rest,exertional, paroxysmal nocturnal), gastrointestinal  bleeding (melena, rectal bleeding), abdominal pain, excessive heart burn, GU symptoms (dysuria, hematuria, voiding/incontinence issues) syncope, focal weakness, memory loss, numbness & tingling, skin/hair/nail changes, depression, anxiety, abnormal bruising/bleeding, musculoskeletal symptoms/signs.     Objective:   Physical Exam General Appearance:    Alert, cooperative, no distress, appears stated age  Head:    Normocephalic, without obvious abnormality, atraumatic  Eyes:    PERRL, conjunctiva/corneas clear, EOM's intact, fundi    benign, both eyes       Ears:    Normal TM's and external ear canals, both ears  Nose:   Nares normal, septum midline, mucosa normal, no drainage   or sinus tenderness  Throat:   Lips, mucosa, and tongue normal; teeth and gums normal  Neck:   Supple, symmetrical, trachea midline, no adenopathy;       thyroid:  No enlargement/tenderness/nodules  Back:     Symmetric, no curvature, ROM normal, no CVA tenderness  Lungs:     Clear to auscultation bilaterally, respirations unlabored  Chest wall:    No tenderness or deformity  Heart:    Regular rate and rhythm, S1 and S2 normal, no murmur, rub   or gallop  Abdomen:     Soft, non-tender, bowel sounds active all four quadrants,    no masses, no organomegaly  Genitalia:    Deferred at pt's request  Rectal:    Extremities:   Extremities normal, atraumatic, no cyanosis or edema  Pulses:   2+ and symmetric all extremities  Skin:   Skin color, texture, turgor normal, no rashes or lesions   Lymph nodes:   Cervical, supraclavicular, and axillary nodes normal  Neurologic:   CNII-XII intact. Normal strength, sensation and reflexes      throughout          Assessment & Plan:

## 2018-06-04 NOTE — Assessment & Plan Note (Signed)
Pt's PE WNL.  UTD on colonoscopy, immunizations.  Check labs.  Anticipatory guidance provided.  

## 2018-06-04 NOTE — Patient Instructions (Signed)
Follow up in 6 months to recheck cholesterol We'll notify you of your lab results and make any changes if needed Keep up the good work!  You look great! Call with any questions or concerns Happy New Year!!!

## 2018-06-04 NOTE — Assessment & Plan Note (Signed)
Chronic problem.  Tolerating statin w/o difficulty.  Check labs.  Adjust meds prn  

## 2018-06-05 LAB — CBC WITH DIFFERENTIAL/PLATELET
Absolute Monocytes: 582 cells/uL (ref 200–950)
BASOS ABS: 42 {cells}/uL (ref 0–200)
Basophils Relative: 0.7 %
EOS PCT: 6 %
Eosinophils Absolute: 360 cells/uL (ref 15–500)
HCT: 44.3 % (ref 38.5–50.0)
Hemoglobin: 15.2 g/dL (ref 13.2–17.1)
Lymphs Abs: 1278 cells/uL (ref 850–3900)
MCH: 31.1 pg (ref 27.0–33.0)
MCHC: 34.3 g/dL (ref 32.0–36.0)
MCV: 90.6 fL (ref 80.0–100.0)
MONOS PCT: 9.7 %
MPV: 10.9 fL (ref 7.5–12.5)
Neutro Abs: 3738 cells/uL (ref 1500–7800)
Neutrophils Relative %: 62.3 %
Platelets: 180 10*3/uL (ref 140–400)
RBC: 4.89 10*6/uL (ref 4.20–5.80)
RDW: 11.8 % (ref 11.0–15.0)
Total Lymphocyte: 21.3 %
WBC: 6 10*3/uL (ref 3.8–10.8)

## 2018-06-05 LAB — PSA: PSA: 0.4 ng/mL (ref <=4.0)

## 2018-06-05 LAB — HEPATIC FUNCTION PANEL
AG Ratio: 2 (calc) (ref 1.0–2.5)
ALT: 13 U/L (ref 9–46)
AST: 16 U/L (ref 10–35)
Albumin: 4.3 g/dL (ref 3.6–5.1)
Alkaline phosphatase (APISO): 74 U/L (ref 40–115)
Bilirubin, Direct: 0.2 mg/dL (ref 0.0–0.2)
Globulin: 2.1 g/dL (calc) (ref 1.9–3.7)
Indirect Bilirubin: 0.6 mg/dL (calc) (ref 0.2–1.2)
Total Bilirubin: 0.8 mg/dL (ref 0.2–1.2)
Total Protein: 6.4 g/dL (ref 6.1–8.1)

## 2018-06-05 LAB — BASIC METABOLIC PANEL
BUN: 16 mg/dL (ref 7–25)
CALCIUM: 9.6 mg/dL (ref 8.6–10.3)
CO2: 34 mmol/L — AB (ref 20–32)
Chloride: 101 mmol/L (ref 98–110)
Creat: 0.95 mg/dL (ref 0.70–1.11)
Glucose, Bld: 95 mg/dL (ref 65–99)
Potassium: 4.5 mmol/L (ref 3.5–5.3)
SODIUM: 139 mmol/L (ref 135–146)

## 2018-06-05 LAB — LIPID PANEL
Cholesterol: 120 mg/dL (ref <200)
HDL: 44 mg/dL (ref >40)
LDL Cholesterol (Calc): 58 mg/dL (calc)
Non-HDL Cholesterol (Calc): 76 mg/dL (calc) (ref <130)
Total CHOL/HDL Ratio: 2.7 (calc) (ref <5.0)
Triglycerides: 94 mg/dL (ref <150)

## 2018-06-05 LAB — TSH: TSH: 2.34 m[IU]/L (ref 0.40–4.50)

## 2018-06-06 ENCOUNTER — Encounter: Payer: Self-pay | Admitting: General Practice

## 2018-07-24 DIAGNOSIS — D225 Melanocytic nevi of trunk: Secondary | ICD-10-CM | POA: Diagnosis not present

## 2018-07-24 DIAGNOSIS — D0439 Carcinoma in situ of skin of other parts of face: Secondary | ICD-10-CM | POA: Diagnosis not present

## 2018-07-24 DIAGNOSIS — L570 Actinic keratosis: Secondary | ICD-10-CM | POA: Diagnosis not present

## 2018-07-24 DIAGNOSIS — L820 Inflamed seborrheic keratosis: Secondary | ICD-10-CM | POA: Diagnosis not present

## 2018-07-24 DIAGNOSIS — X32XXXD Exposure to sunlight, subsequent encounter: Secondary | ICD-10-CM | POA: Diagnosis not present

## 2018-09-12 ENCOUNTER — Other Ambulatory Visit: Payer: Self-pay | Admitting: Family Medicine

## 2018-09-12 DIAGNOSIS — E785 Hyperlipidemia, unspecified: Secondary | ICD-10-CM

## 2018-10-21 DIAGNOSIS — Z08 Encounter for follow-up examination after completed treatment for malignant neoplasm: Secondary | ICD-10-CM | POA: Diagnosis not present

## 2018-10-21 DIAGNOSIS — L570 Actinic keratosis: Secondary | ICD-10-CM | POA: Diagnosis not present

## 2018-10-21 DIAGNOSIS — X32XXXD Exposure to sunlight, subsequent encounter: Secondary | ICD-10-CM | POA: Diagnosis not present

## 2018-10-21 DIAGNOSIS — Z85828 Personal history of other malignant neoplasm of skin: Secondary | ICD-10-CM | POA: Diagnosis not present

## 2018-12-03 ENCOUNTER — Ambulatory Visit: Payer: Medicare HMO | Admitting: Family Medicine

## 2018-12-11 ENCOUNTER — Other Ambulatory Visit: Payer: Self-pay

## 2018-12-11 ENCOUNTER — Encounter: Payer: Self-pay | Admitting: Family Medicine

## 2018-12-11 ENCOUNTER — Ambulatory Visit (INDEPENDENT_AMBULATORY_CARE_PROVIDER_SITE_OTHER): Payer: Medicare HMO | Admitting: Family Medicine

## 2018-12-11 VITALS — BP 118/81 | HR 72 | Temp 98.0°F | Resp 17 | Ht 71.0 in | Wt 186.2 lb

## 2018-12-11 DIAGNOSIS — E663 Overweight: Secondary | ICD-10-CM | POA: Insufficient documentation

## 2018-12-11 DIAGNOSIS — E7800 Pure hypercholesterolemia, unspecified: Secondary | ICD-10-CM

## 2018-12-11 DIAGNOSIS — J479 Bronchiectasis, uncomplicated: Secondary | ICD-10-CM | POA: Diagnosis not present

## 2018-12-11 LAB — CBC WITH DIFFERENTIAL/PLATELET
Basophils Absolute: 0 10*3/uL (ref 0.0–0.1)
Basophils Relative: 0.7 % (ref 0.0–3.0)
Eosinophils Absolute: 0.2 10*3/uL (ref 0.0–0.7)
Eosinophils Relative: 4.5 % (ref 0.0–5.0)
HCT: 43.2 % (ref 39.0–52.0)
Hemoglobin: 14.5 g/dL (ref 13.0–17.0)
Lymphocytes Relative: 25.4 % (ref 12.0–46.0)
Lymphs Abs: 1.2 10*3/uL (ref 0.7–4.0)
MCHC: 33.6 g/dL (ref 30.0–36.0)
MCV: 92.8 fl (ref 78.0–100.0)
Monocytes Absolute: 0.5 10*3/uL (ref 0.1–1.0)
Monocytes Relative: 10.3 % (ref 3.0–12.0)
Neutro Abs: 2.8 10*3/uL (ref 1.4–7.7)
Neutrophils Relative %: 59.1 % (ref 43.0–77.0)
Platelets: 170 10*3/uL (ref 150.0–400.0)
RBC: 4.66 Mil/uL (ref 4.22–5.81)
RDW: 12.8 % (ref 11.5–15.5)
WBC: 4.7 10*3/uL (ref 4.0–10.5)

## 2018-12-11 LAB — LIPID PANEL
Cholesterol: 116 mg/dL (ref 0–200)
HDL: 39.8 mg/dL (ref >39.00)
LDL Cholesterol: 53 mg/dL (ref 0–99)
NonHDL: 75.75
Total CHOL/HDL Ratio: 3
Triglycerides: 113 mg/dL (ref 0.0–149.0)
VLDL: 22.6 mg/dL (ref 0.0–40.0)

## 2018-12-11 LAB — BASIC METABOLIC PANEL
BUN: 23 mg/dL (ref 6–23)
CO2: 30 mEq/L (ref 19–32)
Calcium: 9 mg/dL (ref 8.4–10.5)
Chloride: 103 mEq/L (ref 96–112)
Creatinine, Ser: 0.92 mg/dL (ref 0.40–1.50)
GFR: 78 mL/min (ref >60.00)
Glucose, Bld: 120 mg/dL — ABNORMAL HIGH (ref 70–99)
Potassium: 4.3 mEq/L (ref 3.5–5.1)
Sodium: 140 mEq/L (ref 135–145)

## 2018-12-11 LAB — HEPATIC FUNCTION PANEL
ALT: 14 U/L (ref 0–53)
AST: 20 U/L (ref 0–37)
Albumin: 4.1 g/dL (ref 3.5–5.2)
Alkaline Phosphatase: 73 U/L (ref 39–117)
Bilirubin, Direct: 0.1 mg/dL (ref 0.0–0.3)
Total Bilirubin: 0.8 mg/dL (ref 0.2–1.2)
Total Protein: 6.3 g/dL (ref 6.0–8.3)

## 2018-12-11 LAB — TSH: TSH: 1.96 u[IU]/mL (ref 0.35–4.50)

## 2018-12-11 NOTE — Assessment & Plan Note (Signed)
Chronic problem.  Tolerating statin w/o difficulty.  Continues to exercise daily.  Check labs.  Adjust meds prn

## 2018-12-11 NOTE — Assessment & Plan Note (Signed)
Following w/ Dr Melvyn Novas PRN.  Currently asymptomatic.

## 2018-12-11 NOTE — Patient Instructions (Signed)
Schedule your physical in 6 months We'll notify you of your lab results and make any changes if needed Keep up the good work!  You look great! Call with any questions or concerns Stay Safe!!!

## 2018-12-11 NOTE — Progress Notes (Signed)
   Subjective:    Patient ID: [name] [name], male    DOB: [birth year], 85 y.o.   MRN: [id number]  HPI Hyperlipidemia- chronic problem, on Lipitor 40mg  daily and Fish Oil TID.  No CP, SOB, HAs, visual changes, abd pain, N/V.  Overweight- pt has gained 6 lbs since last visit.  BMI now 25.98.  Pt is walking 3-4 miles/days and daily home exercises.  Bronchiectasis- chronic problem.  Not currently on medication.  Was told by Dr Melvyn Novas to follow up PRN.  Currently asymptomatic.   Review of Systems For ROS see HPI     Objective:   Physical Exam Vitals signs reviewed.  Constitutional:      General: He is not in acute distress.    Appearance: He is well-developed.  HENT:     Head: Normocephalic and atraumatic.  Eyes:     Conjunctiva/sclera: Conjunctivae normal.     Pupils: Pupils are equal, round, and reactive to light.  Neck:     Musculoskeletal: Normal range of motion and neck supple.     Thyroid: No thyromegaly.  Cardiovascular:     Rate and Rhythm: Normal rate and regular rhythm.     Heart sounds: Normal heart sounds. No murmur.  Pulmonary:     Effort: Pulmonary effort is normal. No respiratory distress.     Breath sounds: Normal breath sounds.  Abdominal:     General: Bowel sounds are normal. There is no distension.     Palpations: Abdomen is soft.  Lymphadenopathy:     Cervical: No cervical adenopathy.  Skin:    General: Skin is warm and dry.  Neurological:     Mental Status: He is alert and oriented to person, place, and time.     Cranial Nerves: No cranial nerve deficit.  Psychiatric:        Behavior: Behavior normal.           Assessment & Plan:

## 2018-12-11 NOTE — Assessment & Plan Note (Signed)
Pt has gained 6 lbs since last visit but continues to exercise daily.  Encouraged him to continue his exercise and to make healthy food choices.  Will follow.

## 2018-12-23 DIAGNOSIS — H0288A Meibomian gland dysfunction right eye, upper and lower eyelids: Secondary | ICD-10-CM | POA: Diagnosis not present

## 2018-12-23 DIAGNOSIS — H0102A Squamous blepharitis right eye, upper and lower eyelids: Secondary | ICD-10-CM | POA: Diagnosis not present

## 2018-12-23 DIAGNOSIS — H0288B Meibomian gland dysfunction left eye, upper and lower eyelids: Secondary | ICD-10-CM | POA: Diagnosis not present

## 2018-12-23 DIAGNOSIS — H0102B Squamous blepharitis left eye, upper and lower eyelids: Secondary | ICD-10-CM | POA: Diagnosis not present

## 2018-12-23 DIAGNOSIS — M3501 Sicca syndrome with keratoconjunctivitis: Secondary | ICD-10-CM | POA: Diagnosis not present

## 2018-12-23 DIAGNOSIS — Z961 Presence of intraocular lens: Secondary | ICD-10-CM | POA: Diagnosis not present

## 2018-12-27 ENCOUNTER — Encounter: Payer: Self-pay | Admitting: *Deleted

## 2018-12-31 DIAGNOSIS — R69 Illness, unspecified: Secondary | ICD-10-CM | POA: Diagnosis not present

## 2019-01-10 ENCOUNTER — Ambulatory Visit: Payer: Medicare HMO | Admitting: Cardiology

## 2019-01-15 DIAGNOSIS — H0102B Squamous blepharitis left eye, upper and lower eyelids: Secondary | ICD-10-CM | POA: Diagnosis not present

## 2019-01-15 DIAGNOSIS — H02103 Unspecified ectropion of right eye, unspecified eyelid: Secondary | ICD-10-CM | POA: Diagnosis not present

## 2019-01-15 DIAGNOSIS — H0102A Squamous blepharitis right eye, upper and lower eyelids: Secondary | ICD-10-CM | POA: Diagnosis not present

## 2019-01-15 DIAGNOSIS — H02106 Unspecified ectropion of left eye, unspecified eyelid: Secondary | ICD-10-CM | POA: Diagnosis not present

## 2019-01-27 DIAGNOSIS — R69 Illness, unspecified: Secondary | ICD-10-CM | POA: Diagnosis not present

## 2019-03-06 ENCOUNTER — Ambulatory Visit (INDEPENDENT_AMBULATORY_CARE_PROVIDER_SITE_OTHER): Payer: Medicare HMO | Admitting: Family Medicine

## 2019-03-06 ENCOUNTER — Ambulatory Visit: Payer: Medicare HMO

## 2019-03-06 ENCOUNTER — Encounter: Payer: Self-pay | Admitting: Family Medicine

## 2019-03-06 ENCOUNTER — Other Ambulatory Visit: Payer: Self-pay

## 2019-03-06 VITALS — BP 111/81 | HR 69 | Temp 97.8°F | Resp 17 | Ht 71.0 in | Wt 182.4 lb

## 2019-03-06 DIAGNOSIS — Z0000 Encounter for general adult medical examination without abnormal findings: Secondary | ICD-10-CM | POA: Diagnosis not present

## 2019-03-06 DIAGNOSIS — J479 Bronchiectasis, uncomplicated: Secondary | ICD-10-CM | POA: Diagnosis not present

## 2019-03-06 DIAGNOSIS — I712 Thoracic aortic aneurysm, without rupture, unspecified: Secondary | ICD-10-CM

## 2019-03-06 DIAGNOSIS — E7800 Pure hypercholesterolemia, unspecified: Secondary | ICD-10-CM

## 2019-03-06 DIAGNOSIS — I420 Dilated cardiomyopathy: Secondary | ICD-10-CM | POA: Diagnosis not present

## 2019-03-06 NOTE — Progress Notes (Signed)
   Subjective:    Patient ID: [name] [name], male    DOB: [birth year], 85 y.o.   MRN: [id number]  HPI Here today for MWV.  Risk Factors: Hyperlipidemia- recent labs WNL on statin.  No abd pain, N/V. Cardiomyopathy- chronic problem, following w/ Dr Stanford Breed.  Currently asymptomatic.  No CP, SOB, edema. Bronchiectasis- following w/ Dr Melvyn Novas.  Currently asymptomatic.  No cough, SOB Physical Activity: walking for 20+ minutes daily and doing regular stretches Fall Risk: low  Depression: denies Hearing: decreased to conversational tones and whispered voice ADL's: independent Cognitive: normal linear thought process, memory and attention intact, MMSE 26/30 Home Safety: safe at home, lives w/ wife Height, Weight, BMI, Visual Acuity: see vitals, vision corrected to 20/20 w/ glasses Counseling: UTD on colonoscopy, immunizations, follows w/ urology [place] will: pt has both Labs Ordered: See A&P Care Plan: See A&P   Patient Care Team    Relationship Specialty Notifications Start End  Midge Minium, MD PCP - General Family Medicine  08/28/16   Lelon Perla, MD  Cardiology  08/25/11   Kathie Rhodes, MD  Urology  08/25/11   Tanda Rockers, MD Consulting Physician Pulmonary Disease  08/28/16   Allyn Kenner, MD  Dermatology  02/22/17    Comment: [facility]  Doran Stabler, MD Consulting Physician Gastroenterology  02/28/18      Review of Systems Patient reports no vision/hearing changes, anorexia, fever ,adenopathy, persistant/recurrent hoarseness, swallowing issues, chest pain, palpitations, edema, persistant/recurrent cough, hemoptysis, dyspnea (rest,exertional, paroxysmal nocturnal), gastrointestinal  bleeding (melena, rectal bleeding), abdominal pain, excessive heart burn, GU symptoms (dysuria, hematuria, voiding/incontinence issues) syncope, focal weakness, memory loss, numbness & tingling, skin/hair/nail changes, depression, anxiety, abnormal bruising/bleeding,  musculoskeletal symptoms/signs.     Objective:   Physical Exam Vitals signs reviewed.  Constitutional:      General: He is not in acute distress.    Appearance: Normal appearance. He is well-developed.  HENT:     Head: Normocephalic and atraumatic.  Eyes:     Conjunctiva/sclera: Conjunctivae normal.     Pupils: Pupils are equal, round, and reactive to light.  Neck:     Musculoskeletal: Normal range of motion and neck supple.     Thyroid: No thyromegaly.  Cardiovascular:     Rate and Rhythm: Normal rate and regular rhythm.     Heart sounds: Normal heart sounds. No murmur.  Pulmonary:     Effort: Pulmonary effort is normal. No respiratory distress.     Breath sounds: Normal breath sounds.  Abdominal:     General: Bowel sounds are normal. There is no distension.     Palpations: Abdomen is soft.  Lymphadenopathy:     Cervical: No cervical adenopathy.  Skin:    General: Skin is warm and dry.  Neurological:     Mental Status: He is alert and oriented to person, place, and time.     Cranial Nerves: No cranial nerve deficit.  Psychiatric:        Behavior: Behavior normal.           Assessment & Plan:

## 2019-03-06 NOTE — Assessment & Plan Note (Signed)
Chronic problem.  Tolerating statin w/o difficulty.  Reviewed recent labs- WNL.  No changes needed

## 2019-03-06 NOTE — Assessment & Plan Note (Signed)
Following w/ Dr Stanford Breed.  Currently asymptomatic.  On ASA, statin, beta blocker.

## 2019-03-06 NOTE — Patient Instructions (Signed)
Schedule your complete physical in 6 months No need for labs today Keep up the good work!  You look great! I love that you're walking and stretching daily.  Good for you! Call with any questions or concerns Stay Safe!!   Preventive Care 83 Years and Older, Male Preventive care refers to lifestyle choices and visits with your health care provider that can promote health and wellness. This includes:  A yearly physical exam. This is also called an annual well check.  Regular dental and eye exams.  Immunizations.  Screening for certain conditions.  Healthy lifestyle choices, such as diet and exercise. What can I expect for my preventive care visit? Physical exam Your health care provider will check:  Height and weight. These may be used to calculate body mass index (BMI), which is a measurement that tells if you are at a healthy weight.  Heart rate and blood pressure.  Your skin for abnormal spots. Counseling Your health care provider may ask you questions about:  Alcohol, tobacco, and drug use.  Emotional well-being.  Home and relationship well-being.  Sexual activity.  Eating habits.  History of falls.  Memory and ability to understand (cognition).  Work and work Statistician. What immunizations do I need?  Influenza (flu) vaccine  This is recommended every year. Tetanus, diphtheria, and pertussis (Tdap) vaccine  You may need a Td booster every 10 years. Varicella (chickenpox) vaccine  You may need this vaccine if you have not already been vaccinated. Zoster (shingles) vaccine  You may need this after age 79. Pneumococcal conjugate (PCV13) vaccine  One dose is recommended after age 37. Pneumococcal polysaccharide (PPSV23) vaccine  One dose is recommended after age 52. Measles, mumps, and rubella (MMR) vaccine  You may need at least one dose of MMR if you were born in 1957 or later. You may also need a second dose. Meningococcal conjugate (MenACWY)  vaccine  You may need this if you have certain conditions. Hepatitis A vaccine  You may need this if you have certain conditions or if you travel or work in places where you may be exposed to hepatitis A. Hepatitis B vaccine  You may need this if you have certain conditions or if you travel or work in places where you may be exposed to hepatitis B. Haemophilus influenzae type b (Hib) vaccine  You may need this if you have certain conditions. You may receive vaccines as individual doses or as more than one vaccine together in one shot (combination vaccines). Talk with your health care provider about the risks and benefits of combination vaccines. What tests do I need? Blood tests  Lipid and cholesterol levels. These may be checked every 5 years, or more frequently depending on your overall health.  Hepatitis C test.  Hepatitis B test. Screening  Lung cancer screening. You may have this screening every year starting at age 74 if you have a 30-pack-year history of smoking and currently smoke or have quit within the past 15 years.  Colorectal cancer screening. All adults should have this screening starting at age 17 and continuing until age 54. Your health care provider may recommend screening at age 68 if you are at increased risk. You will have tests every 1-10 years, depending on your results and the type of screening test.  Prostate cancer screening. Recommendations will vary depending on your family history and other risks.  Diabetes screening. This is done by checking your blood sugar (glucose) after you have not eaten for a while (  fasting). You may have this done every 1-3 years.  Abdominal aortic aneurysm (AAA) screening. You may need this if you are a current or former smoker.  Sexually transmitted disease (STD) testing. Follow these instructions at home: Eating and drinking  Eat a diet that includes fresh fruits and vegetables, whole grains, lean protein, and low-fat dairy  products. Limit your intake of foods with high amounts of sugar, saturated fats, and salt.  Take vitamin and mineral supplements as recommended by your health care provider.  Do not drink alcohol if your health care provider tells you not to drink.  If you drink alcohol: ? Limit how much you have to 0-2 drinks a day. ? Be aware of how much alcohol is in your drink. In the [place] one drink equals one 12 oz bottle of beer (355 mL), one 5 oz glass of wine (148 mL), or one 1 oz glass of hard liquor (44 mL). Lifestyle  Take daily care of your teeth and gums.  Stay active. Exercise for at least 30 minutes on 5 or more days each week.  Do not use any products that contain nicotine or tobacco, such as cigarettes, e-cigarettes, and chewing tobacco. If you need help quitting, ask your health care provider.  If you are sexually active, practice safe sex. Use a condom or other form of protection to prevent STIs (sexually transmitted infections).  Talk with your health care provider about taking a low-dose aspirin or statin. What's next?  Visit your health care provider once a year for a well check visit.  Ask your health care provider how often you should have your eyes and teeth checked.  Stay up to date on all vaccines. This information is not intended to replace advice given to you by your health care provider. Make sure you discuss any questions you have with your health care provider. Document Released: 06/18/2015 Document Revised: 05/16/2018 Document Reviewed: 05/16/2018 Elsevier Patient Education  2020 [employer].

## 2019-03-06 NOTE — Assessment & Plan Note (Signed)
Chronic problem.  Currently asymptomatic.  Following w/ cards.  Will follow along.

## 2019-03-06 NOTE — Assessment & Plan Note (Signed)
Currently asymptomatic.  Following w/ Dr Melvyn Novas.  Will follow along.

## 2019-03-10 ENCOUNTER — Other Ambulatory Visit: Payer: Self-pay | Admitting: Family Medicine

## 2019-03-10 ENCOUNTER — Other Ambulatory Visit: Payer: Self-pay | Admitting: Cardiology

## 2019-03-10 DIAGNOSIS — E785 Hyperlipidemia, unspecified: Secondary | ICD-10-CM

## 2019-03-10 DIAGNOSIS — I10 Essential (primary) hypertension: Secondary | ICD-10-CM

## 2019-04-04 NOTE — Progress Notes (Signed)
HPI: FU palpitations, cardiomyopathy and family history of abdominal aortic aneurysm. Carotid dopplers 7/09 showed normal carotid arteries bilaterally. Myoview in September of 2013. Ejection fraction was 53% and the perfusion was normal. Last echo 7/18 showed normal LV function; TVP with moderate TR; small pericardial effusion; normal aortic root. Abdominal ultrasound 7/18 showed proximal abdominal aorta measuring 2.5 x 2.5 cm.. Since I last saw himhe has dyspnea with more vigorous activities but not routine activities.  No orthopnea, PND, pedal edema, palpitations or syncope.  No chest pain.  Current Outpatient Medications  Medication Sig Dispense Refill  . aspirin 81 MG EC tablet Take 81 mg by mouth daily.      Marland Kitchen atorvastatin (LIPITOR) 40 MG tablet TAKE 1 TABLET (40 MG TOTAL) BY MOUTH DAILY AT 6 PM. 90 tablet 1  . Calcium Carbonate-Vitamin D (CALCIUM 600/VITAMIN D) 600-400 MG-UNIT per tablet Take 1 tablet by mouth 2 (two) times daily.      . carboxymethylcellulose (RETAINE CMC) 0.5 % SOLN 1 drop 3 (three) times daily as needed.    Marland Kitchen co-enzyme Q-10 30 MG capsule Take 30 mg by mouth 2 (two) times daily.      Marland Kitchen erythromycin ophthalmic ointment PLACE INTO BOTH EYES 2 (TWO) TIMES DAILY FOR 10 DAYS    . fish oil-omega-3 fatty acids 1000 MG capsule Take 1 g by mouth daily. Nordic naturals omega 3 TID    . fluticasone (FLONASE) 50 MCG/ACT nasal spray USE 2 SPRAYS IN EACH NOSTRIL DAILY. 48 g 1  . metoprolol succinate (TOPROL-XL) 25 MG 24 hr tablet TAKE 1/2 TABLET BY MOUTH EVERY DAY 45 tablet 3  . Multiple Vitamins-Minerals (VISION-VITE PRESERVE PO) Take by mouth. 2 per day    . TURMERIC PO Take 2,000 mg by mouth daily.    . Wheat Dextrin (EQ FIBER POWDER PO) Take by mouth.     No current facility-administered medications for this visit.      Past Medical History:  Diagnosis Date  . Benign tumor of lung   . BPH (benign prostatic hyperplasia)   . Cardiomyopathy ([place])   . Cataract   .  Dilated aortic root ([facility])    Hx of mild  . Hyperlipidemia   . Mitral valve prolapse    irregular heart beat  . Palpitations   . Pure hypercholesterolemia   . RBBB (right bundle branch block)     Past Surgical History:  Procedure Laterality Date  . BLEPHAROPLASTY Bilateral   . CATARACT EXTRACTION, BILATERAL    . COSMETIC SURGERY    . Excision on neck     Excision of 2 cm soft tissue mass of left neck  . INGUINAL HERNIA REPAIR Bilateral   . Resection of Lung Right    Hx of, right lung secondary to benign tumor  . SHOULDER ARTHROSCOPY Left   . testicular hernia    . VASECTOMY      Social History   Socioeconomic History  . Marital status: Married    Spouse name: Not on file  . Number of children: 1  . Years of education: Not on file  . Highest education level: Not on file  Occupational History  . Occupation: Farmer-retired  Social Needs  . Financial resource strain: Not on file  . Food insecurity    Worry: Not on file    Inability: Not on file  . Transportation needs    Medical: Not on file    Non-medical: Not on file  Tobacco Use  .  Smoking status: Former Smoker    Types: Cigarettes    Quit date: 1982    Years since quitting: 38.8  . Smokeless tobacco: Never Used  . Tobacco comment: quit 80's  Substance and Sexual Activity  . Alcohol use: No    Alcohol/week: 0.0 standard drinks  . Drug use: No  . Sexual activity: Never  Lifestyle  . Physical activity    Days per week: Not on file    Minutes per session: Not on file  . Stress: Not on file  Relationships  . Social Herbalist on phone: Not on file    Gets together: Not on file    Attends religious service: Not on file    Active member of club or organization: Not on file    Attends meetings of clubs or organizations: Not on file    Relationship status: Not on file  . Intimate partner violence    Fear of current or ex partner: Not on file    Emotionally abused: Not on file    Physically  abused: Not on file    Forced sexual activity: Not on file  Other Topics Concern  . Not on file  Social History Narrative   Exercise 20-30 min walks. Patient is married. Education: [employer].     Family History  Problem Relation Age of Onset  . Aortic aneurysm Father   . Heart disease Father   . Hypertension Father   . Hyperlipidemia Father   . Lung disease Sister   . Breast cancer Sister   . Pancreatic cancer Mother   . Hyperlipidemia Other   . Colon cancer Sister   . Breast cancer Sister   . Colon polyps Sister   . Esophageal cancer Neg Hx   . Stomach cancer Neg Hx   . Rectal cancer Neg Hx     ROS: no fevers or chills, productive cough, hemoptysis, dysphasia, odynophagia, melena, hematochezia, dysuria, hematuria, rash, seizure activity, orthopnea, PND, pedal edema, claudication. Remaining systems are negative.  Physical Exam: Well-developed well-nourished in no acute distress.  Skin is warm and dry.  HEENT is normal.  Neck is supple.  Chest is clear to auscultation with normal expansion.  Cardiovascular exam is regular and bradycardic Abdominal exam nontender or distended. No masses palpated. Extremities show no edema. neuro grossly intact  ECG-sinus bradycardia, right bundle branch block.  Personally reviewed  [name]  1 cardiomyopathy-LV function has improved on most recent echocardiogram.    2 palpitations-symptoms are controlled at present.  He is bradycardic however.  I will discontinue metoprolol and follow for recurrent symptoms.  3 history of mildly dilated aortic root-this was not evident on most recent echocardiogram.  4 hyperlipidemia-continue statin.  5 history of moderate tricuspid regurgitation-we will repeat echocardiogram.  Given patient's age we would like to be conservative.  Kirk Ruths, MD

## 2019-04-09 ENCOUNTER — Encounter: Payer: Self-pay | Admitting: Cardiology

## 2019-04-09 ENCOUNTER — Ambulatory Visit: Payer: Medicare HMO | Admitting: Cardiology

## 2019-04-09 ENCOUNTER — Other Ambulatory Visit: Payer: Self-pay

## 2019-04-09 VITALS — BP 120/78 | HR 46 | Ht 71.0 in | Wt 179.0 lb

## 2019-04-09 DIAGNOSIS — I071 Rheumatic tricuspid insufficiency: Secondary | ICD-10-CM

## 2019-04-09 DIAGNOSIS — R002 Palpitations: Secondary | ICD-10-CM

## 2019-04-09 DIAGNOSIS — I77810 Thoracic aortic ectasia: Secondary | ICD-10-CM | POA: Diagnosis not present

## 2019-04-09 DIAGNOSIS — I420 Dilated cardiomyopathy: Secondary | ICD-10-CM | POA: Diagnosis not present

## 2019-04-09 NOTE — Addendum Note (Signed)
Addended by: Cristopher Estimable on: 04/09/2019 08:59 AM   Modules accepted: Orders

## 2019-04-09 NOTE — Patient Instructions (Signed)
Medication Instructions:  STOP METOPROLOL  *If you need a refill on your cardiac medications before your next appointment, please call your pharmacy*  Lab Work: If you have labs (blood work) drawn today and your tests are completely normal, you will receive your results only by: Marland Kitchen MyChart Message (if you have MyChart) OR . A paper copy in the mail If you have any lab test that is abnormal or we need to change your treatment, we will call you to review the results.  Testing/Procedures: Your physician has requested that you have an echocardiogram. Echocardiography is a painless test that uses sound waves to create images of your heart. It provides your doctor with information about the size and shape of your heart and how well your heart's chambers and valves are working. This procedure takes approximately one hour. There are no restrictions for this procedure.[place]    Follow-Up: At [facility], you and your health needs are our priority.  As part of our continuing mission to provide you with exceptional heart care, we have created designated Provider Care Teams.  These Care Teams include your primary Cardiologist (physician) and Advanced Practice Providers (APPs -  Physician Assistants and Nurse Practitioners) who all work together to provide you with the care you need, when you need it.  Your next appointment:   12 months  The format for your next appointment:   In Person  Provider:   Kirk Ruths, MD

## 2019-04-16 ENCOUNTER — Other Ambulatory Visit: Payer: Self-pay

## 2019-04-16 ENCOUNTER — Ambulatory Visit (HOSPITAL_COMMUNITY): Payer: Medicare HMO | Attending: Cardiovascular Disease

## 2019-04-16 DIAGNOSIS — I071 Rheumatic tricuspid insufficiency: Secondary | ICD-10-CM

## 2019-04-28 DIAGNOSIS — L820 Inflamed seborrheic keratosis: Secondary | ICD-10-CM | POA: Diagnosis not present

## 2019-04-28 DIAGNOSIS — D225 Melanocytic nevi of trunk: Secondary | ICD-10-CM | POA: Diagnosis not present

## 2019-04-28 DIAGNOSIS — L570 Actinic keratosis: Secondary | ICD-10-CM | POA: Diagnosis not present

## 2019-04-28 DIAGNOSIS — X32XXXD Exposure to sunlight, subsequent encounter: Secondary | ICD-10-CM | POA: Diagnosis not present

## 2019-04-28 DIAGNOSIS — Z08 Encounter for follow-up examination after completed treatment for malignant neoplasm: Secondary | ICD-10-CM | POA: Diagnosis not present

## 2019-04-28 DIAGNOSIS — Z85828 Personal history of other malignant neoplasm of skin: Secondary | ICD-10-CM | POA: Diagnosis not present

## 2019-06-09 ENCOUNTER — Encounter: Payer: Medicare HMO | Admitting: Family Medicine

## 2019-07-26 ENCOUNTER — Ambulatory Visit: Payer: Medicare HMO | Attending: Internal Medicine

## 2019-07-26 ENCOUNTER — Other Ambulatory Visit: Payer: Self-pay

## 2019-07-26 DIAGNOSIS — Z23 Encounter for immunization: Secondary | ICD-10-CM

## 2019-07-26 NOTE — Progress Notes (Signed)
   Covid-19 Vaccination Clinic  Name:  [name] [name]    MRN: [id number] DOB: Oct 22, 1930  07/26/2019  [name] [name] was observed post Covid-19 immunization for 15 minutes without incidence. He was provided with Vaccine Information Sheet and instruction to access the V-Safe system.   [name] [name] was instructed to call 911 with any severe reactions post vaccine: Marland Kitchen Difficulty breathing  . Swelling of your face and throat  . A fast heartbeat  . A bad rash all over your body  . Dizziness and weakness    Immunizations Administered    Name Date Dose VIS Date Route   Pfizer COVID-19 Vaccine 07/26/2019  2:10 PM 0.3 mL 05/16/2019 Intramuscular   Manufacturer: [place]   Lot: [id number]   Man: [id number]

## 2019-08-13 DIAGNOSIS — R69 Illness, unspecified: Secondary | ICD-10-CM | POA: Diagnosis not present

## 2019-08-19 ENCOUNTER — Ambulatory Visit: Payer: Medicare HMO | Attending: Internal Medicine

## 2019-08-19 DIAGNOSIS — Z23 Encounter for immunization: Secondary | ICD-10-CM

## 2019-08-19 NOTE — Progress Notes (Signed)
   Covid-19 Vaccination Clinic  Name:  CAITLIN SYMES    MRN: [id number] DOB: 1931/01/27  08/19/2019  Mr. Eckles was observed post Covid-19 immunization for 15 minutes without incident. He was provided with Vaccine Information Sheet and instruction to access the V-Safe system.   Mr. Sosinski was instructed to call 911 with any severe reactions post vaccine: Marland Kitchen Difficulty breathing  . Swelling of face and throat  . A fast heartbeat  . A bad rash all over body  . Dizziness and weakness   Immunizations Administered    Name Date Dose VIS Date Route   Pfizer COVID-19 Vaccine 08/19/2019 10:18 AM 0.3 mL 05/16/2019 Intramuscular   Manufacturer: [place]   Lot: [id number]   [place]: [id number]

## 2019-09-04 ENCOUNTER — Encounter: Payer: Medicare HMO | Admitting: Family Medicine

## 2019-09-08 ENCOUNTER — Encounter: Payer: Medicare HMO | Admitting: Family Medicine

## 2019-09-14 ENCOUNTER — Other Ambulatory Visit: Payer: Self-pay | Admitting: Family Medicine

## 2019-09-14 DIAGNOSIS — E785 Hyperlipidemia, unspecified: Secondary | ICD-10-CM

## 2019-12-08 ENCOUNTER — Encounter (HOSPITAL_BASED_OUTPATIENT_CLINIC_OR_DEPARTMENT_OTHER): Payer: Self-pay

## 2019-12-08 ENCOUNTER — Emergency Department (HOSPITAL_BASED_OUTPATIENT_CLINIC_OR_DEPARTMENT_OTHER): Payer: Medicare HMO

## 2019-12-08 ENCOUNTER — Other Ambulatory Visit: Payer: Self-pay

## 2019-12-08 ENCOUNTER — Emergency Department (HOSPITAL_BASED_OUTPATIENT_CLINIC_OR_DEPARTMENT_OTHER)
Admission: EM | Admit: 2019-12-08 | Discharge: 2019-12-08 | Disposition: A | Discharge status: Home / Self Care | Payer: Medicare HMO | Attending: Emergency Medicine | Admitting: Emergency Medicine

## 2019-12-08 DIAGNOSIS — G319 Degenerative disease of nervous system, unspecified: Secondary | ICD-10-CM | POA: Diagnosis not present

## 2019-12-08 DIAGNOSIS — Y929 Unspecified place or not applicable: Secondary | ICD-10-CM | POA: Insufficient documentation

## 2019-12-08 DIAGNOSIS — Z85118 Personal history of other malignant neoplasm of bronchus and lung: Secondary | ICD-10-CM | POA: Diagnosis not present

## 2019-12-08 DIAGNOSIS — Y999 Unspecified external cause status: Secondary | ICD-10-CM | POA: Insufficient documentation

## 2019-12-08 DIAGNOSIS — Z87891 Personal history of nicotine dependence: Secondary | ICD-10-CM | POA: Insufficient documentation

## 2019-12-08 DIAGNOSIS — I6782 Cerebral ischemia: Secondary | ICD-10-CM | POA: Diagnosis not present

## 2019-12-08 DIAGNOSIS — S0990XA Unspecified injury of head, initial encounter: Secondary | ICD-10-CM

## 2019-12-08 DIAGNOSIS — W19XXXA Unspecified fall, initial encounter: Secondary | ICD-10-CM | POA: Diagnosis not present

## 2019-12-08 DIAGNOSIS — Y939 Activity, unspecified: Secondary | ICD-10-CM | POA: Insufficient documentation

## 2019-12-08 DIAGNOSIS — R42 Dizziness and giddiness: Secondary | ICD-10-CM | POA: Insufficient documentation

## 2019-12-08 DIAGNOSIS — J322 Chronic ethmoidal sinusitis: Secondary | ICD-10-CM | POA: Diagnosis not present

## 2019-12-08 NOTE — ED Triage Notes (Signed)
Pt arrives ambulatory to ED with c/o pain in his head after fall on Saturday. Pt states that he was 2 steps off the ground when he fell hitting his head and right arm. Denies dizziness, denies LOC. Not on a blood thinner does take daily ASA.

## 2019-12-08 NOTE — ED Notes (Signed)
ED Provider at bedside. 

## 2019-12-08 NOTE — ED Provider Notes (Signed)
[facility] EMERGENCY DEPARTMENT Provider Note   CSN: [id number] Arrival date & time: 12/08/19  0092     History Chief Complaint  Patient presents with  . Fall    [name] [name] is a 86 y.o. male.  HPI Patient presents with headache after fall.  On Saturday states he became unsteady carrying some things and trying to go out of the door and fell hitting the back of his head and right arm.  Has had dull headache since.  Controlled with Tylenol.  No numbness or weakness.  States will occasionally feel little dizzy.  No acute neck pain.  He states his neck and shoulder always bother him however.  He is not on anticoagulation but is on aspirin.  States he can handle the headaches as long as he knows he is not dying.  No chest or abdominal pain.  Still has been ambulatory since the event.  No loss consciousness with the fall.    Past Medical History:  Diagnosis Date  . Benign tumor of lung   . BPH (benign prostatic hyperplasia)   . Cardiomyopathy ([place])   . Cataract   . Dilated aortic root (HCC)    Hx of mild  . Hyperlipidemia   . Mitral valve prolapse    irregular heart beat  . Palpitations   . Pure hypercholesterolemia   . RBBB (right bundle branch block)     Patient Active Problem List   Diagnosis Date Noted  . Overweight (BMI 25.0-29.9) 12/11/2018  . Physical exam 05/28/2017  . Allergic rhinitis due to pollen 08/28/2016  . Cardiomyopathy ([place]) 01/26/2012  . Obstructive bronchiectasis ([place]) 01/26/2012  . Tricuspid regurgitation 12/06/2011  . Aneurysm of thoracic aorta ([place]) 11/06/2008  . CARDIOVASCULAR STUDIES, ABNORMAL 11/06/2008  . Hyperlipidemia 11/05/2008  . MITRAL REGURGITATION 11/05/2008  . MITRAL VALVE PROLAPSE 11/05/2008  . BUNDLE BRANCH BLOCK, RIGHT 11/05/2008  . PALPITATIONS 11/05/2008    Past Surgical History:  Procedure Laterality Date  . BLEPHAROPLASTY Bilateral   . CATARACT EXTRACTION, BILATERAL    . COSMETIC SURGERY    . Excision on neck      Excision of 2 cm soft tissue mass of left neck  . INGUINAL HERNIA REPAIR Bilateral   . Resection of Lung Right    Hx of, right lung secondary to benign tumor  . SHOULDER ARTHROSCOPY Left   . testicular hernia    . VASECTOMY         Family History  Problem Relation Age of Onset  . Aortic aneurysm Father   . Heart disease Father   . Hypertension Father   . Hyperlipidemia Father   . Lung disease Sister   . Breast cancer Sister   . Pancreatic cancer Mother   . Hyperlipidemia Other   . Colon cancer Sister   . Breast cancer Sister   . Colon polyps Sister   . Esophageal cancer Neg Hx   . Stomach cancer Neg Hx   . Rectal cancer Neg Hx     Social History   Tobacco Use  . Smoking status: Former Smoker    Types: Cigarettes    Quit date: 1982    Years since quitting: 39.5  . Smokeless tobacco: Never Used  . Tobacco comment: quit 80's  Vaping Use  . Vaping Use: Never used  Substance Use Topics  . Alcohol use: No    Alcohol/week: 0.0 standard drinks  . Drug use: No    Home Medications Prior to Admission medications  Medication Sig Start Date End Date Taking? Authorizing Provider  aspirin 81 MG EC tablet Take 81 mg by mouth daily.     Yes [provider]  atorvastatin (LIPITOR) 40 MG tablet TAKE 1 TABLET (40 MG TOTAL) BY MOUTH DAILY AT 6 PM. 09/15/19  Yes Midge Minium, MD  Calcium Carbonate-Vitamin D (CALCIUM 600/VITAMIN D) 600-400 MG-UNIT per tablet Take 1 tablet by mouth 2 (two) times daily.     Yes [provider]  carboxymethylcellulose (RETAINE CMC) 0.5 % SOLN 1 drop 3 (three) times daily as needed.   Yes [provider]  co-enzyme Q-10 30 MG capsule Take 30 mg by mouth 2 (two) times daily.     Yes [provider]  fish oil-omega-3 fatty acids 1000 MG capsule Take 1 g by mouth daily. Nordic naturals omega 3 TID   Yes [provider]  fluticasone (FLONASE) 50 MCG/ACT nasal spray USE 2 SPRAYS IN EACH NOSTRIL DAILY.  08/28/16  Yes Midge Minium, MD  Multiple Vitamins-Minerals (VISION-VITE PRESERVE PO) Take by mouth. 2 per day   Yes [provider]  TURMERIC PO Take 2,000 mg by mouth daily.   Yes [provider]  Wheat Dextrin (EQ FIBER POWDER PO) Take by mouth.   Yes [provider]    Allergies    Prednisone  Review of Systems   Review of Systems  Constitutional: Negative for appetite change.  HENT: Negative for congestion.   Respiratory: Negative for shortness of breath.   Cardiovascular: Negative for chest pain.  Gastrointestinal: Negative for abdominal pain.  Genitourinary: Negative for flank pain.  Musculoskeletal: Negative for back pain.  Neurological: Positive for headaches.  Psychiatric/Behavioral: Negative for confusion.    Physical Exam Updated Vital Signs BP (!) 154/79 (BP Location: Left Arm)   Pulse 70   Temp 97.8 F (36.6 C) (Oral)   Resp 18   Ht 6' (1.829 m)   Wt 81.6 kg   SpO2 98%   BMI 24.41 kg/m   Physical Exam Vitals reviewed.  Constitutional:      Appearance: Normal appearance.  HENT:     Head:     Comments: Minimal occipital tenderness.  No deformity.  No ecchymosis. Eyes:     Extraocular Movements: Extraocular movements intact.  Cardiovascular:     Rate and Rhythm: Regular rhythm.  Pulmonary:     Breath sounds: No wheezing or rhonchi.  Abdominal:     Tenderness: There is no abdominal tenderness.  Musculoskeletal:     Cervical back: Neck supple. No tenderness.     Comments: Skin tear to right elbow without underlying bony tenderness.  No deformity or tenderness over shoulders elbows wrists hips knees or ankles.  No spinal tenderness.  Skin:    General: Skin is warm.     Capillary Refill: Capillary refill takes less than 2 seconds.  Neurological:     Mental Status: He is alert and oriented to person, place, and time.     ED Results / Procedures / Treatments   Labs (all labs ordered are listed, but only abnormal  results are displayed) Labs Reviewed - No data to display  EKG None  Radiology CT Head Wo Contrast  Result Date: 12/08/2019 CLINICAL DATA:  Head trauma, pain in head after fall on Saturday EXAM: CT HEAD WITHOUT CONTRAST TECHNIQUE: Contiguous axial images were obtained from the base of the skull through the vertex without intravenous contrast. COMPARISON:  None FINDINGS: Brain: No evidence of acute infarction, hemorrhage,  hydrocephalus, extra-axial collection or mass lesion/mass effect. Signs of atrophy and chronic microvascular ischemic change. Vascular: No hyperdense vessel or unexpected calcification. Skull: Normal. Negative for fracture or focal lesion. Sinuses/Orbits: Ethmoid opacification with mucous retention cyst or polyp on the RIGHT. Visualized sphenoid sinuses are clear. Orbits are incompletely imaged. Other: None. IMPRESSION: 1. No acute intracranial abnormality. 2. Signs of atrophy and chronic microvascular ischemic change. 3. Ethmoid opacification with mucous retention cyst or polyp on the RIGHT. Electronically Signed   By: Zetta Bills M.D.   On: 12/08/2019 10:31    Procedures Procedures (including critical care time)  Medications Ordered in ED Medications - No data to display  ED Course  I have reviewed the triage vital signs and the nursing notes.  Pertinent labs & imaging results that were available during my care of the patient were reviewed by me and considered in my medical decision making (see chart for details).    MDM Rules/Calculators/A&P                          Patient with fall.  Mild skin tear that does not appear to need treatment.  Also hit head and has had headaches.  Head CT reassuring.  Informed of possible mucous cyst versus polyp also.  Discharge home with PCP follow-up as needed. Final Clinical Impression(s) / ED Diagnoses Final diagnoses:  Minor head injury, initial encounter    Rx / DC Orders ED Discharge Orders    None       Davonna Belling, MD 12/08/19 1040

## 2020-02-03 DIAGNOSIS — R69 Illness, unspecified: Secondary | ICD-10-CM | POA: Diagnosis not present

## 2020-02-20 ENCOUNTER — Encounter: Payer: Medicare HMO | Admitting: Family Medicine

## 2020-02-23 DIAGNOSIS — R69 Illness, unspecified: Secondary | ICD-10-CM | POA: Diagnosis not present

## 2020-03-21 ENCOUNTER — Other Ambulatory Visit: Payer: Self-pay | Admitting: Family Medicine

## 2020-03-21 DIAGNOSIS — E785 Hyperlipidemia, unspecified: Secondary | ICD-10-CM

## 2020-03-29 ENCOUNTER — Other Ambulatory Visit: Payer: Self-pay | Admitting: Family Medicine

## 2020-03-29 DIAGNOSIS — E785 Hyperlipidemia, unspecified: Secondary | ICD-10-CM

## 2020-04-08 DIAGNOSIS — H02106 Unspecified ectropion of left eye, unspecified eyelid: Secondary | ICD-10-CM | POA: Diagnosis not present

## 2020-04-08 DIAGNOSIS — H02103 Unspecified ectropion of right eye, unspecified eyelid: Secondary | ICD-10-CM | POA: Diagnosis not present

## 2020-04-09 NOTE — Progress Notes (Signed)
HPI: FU palpitations, cardiomyopathy and family history of abdominal aortic aneurysm. Carotid dopplers7/09 showednormal carotid arteries bilaterally. Myoview in September of 2013. Ejection fraction was 53% and the perfusion was normal.Abdominal ultrasound 7/18 showed proximal abdominal aorta measuring 2.5 x 2.5 cm..Last echocardiogram November 2020 showed normal LV function, mild left ventricular hypertrophy, grade 1 diastolic dysfunction, moderate tricuspid regurgitation.  Since I last saw himthe patient has dyspnea with more extreme activities but not with routine activities. It is relieved with rest. It is not associated with chest pain. There is no orthopnea, PND or pedal edema. There is no syncope or palpitations. There is no exertional chest pain.   Current Outpatient Medications  Medication Sig Dispense Refill  . aspirin 81 MG EC tablet Take 81 mg by mouth daily.      Marland Kitchen atorvastatin (LIPITOR) 40 MG tablet TAKE 1 TABLET (40 MG TOTAL) BY MOUTH DAILY AT 6 PM. 90 tablet 1  . Calcium Carbonate-Vitamin D (CALCIUM 600/VITAMIN D) 600-400 MG-UNIT per tablet Take 1 tablet by mouth 2 (two) times daily.      . carboxymethylcellulose (RETAINE CMC) 0.5 % SOLN 1 drop 3 (three) times daily as needed.    Marland Kitchen co-enzyme Q-10 30 MG capsule Take 30 mg by mouth 2 (two) times daily.      . fish oil-omega-3 fatty acids 1000 MG capsule Take 1 g by mouth daily. Nordic naturals omega 3 TID    . fluticasone (FLONASE) 50 MCG/ACT nasal spray USE 2 SPRAYS IN EACH NOSTRIL DAILY. 48 g 1  . Multiple Vitamins-Minerals (VISION-VITE PRESERVE PO) Take by mouth. 2 per day    . TURMERIC PO Take 2,000 mg by mouth daily.    . Wheat Dextrin (EQ FIBER POWDER PO) Take by mouth.     No current facility-administered medications for this visit.     Past Medical History:  Diagnosis Date  . Benign tumor of lung   . BPH (benign prostatic hyperplasia)   . Cardiomyopathy ([place])   . Cataract   . Dilated aortic root (HCC)     Hx of mild  . Hyperlipidemia   . Mitral valve prolapse    irregular heart beat  . Palpitations   . Pure hypercholesterolemia   . RBBB (right bundle branch block)     Past Surgical History:  Procedure Laterality Date  . BLEPHAROPLASTY Bilateral   . CATARACT EXTRACTION, BILATERAL    . COSMETIC SURGERY    . Excision on neck     Excision of 2 cm soft tissue mass of left neck  . INGUINAL HERNIA REPAIR Bilateral   . Resection of Lung Right    Hx of, right lung secondary to benign tumor  . SHOULDER ARTHROSCOPY Left   . testicular hernia    . VASECTOMY      Social History   Socioeconomic History  . Marital status: Married    Spouse name: Not on file  . Number of children: 1  . Years of education: Not on file  . Highest education level: Not on file  Occupational History  . Occupation: Farmer-retired  Tobacco Use  . Smoking status: Former Smoker    Types: Cigarettes    Quit date: 1982    Years since quitting: 39.8  . Smokeless tobacco: Never Used  . Tobacco comment: quit 80's  Vaping Use  . Vaping Use: Never used  Substance and Sexual Activity  . Alcohol use: No    Alcohol/week: 0.0 standard drinks  . Drug  use: No  . Sexual activity: Never  Other Topics Concern  . Not on file  Social History Narrative   Exercise 20-30 min walks. Patient is married. Education: [employer].    Social Determinants of Health   Financial Resource Strain:   . Difficulty of Paying Living Expenses: Not on file  Food Insecurity:   . Worried About Charity fundraiser in the Last Year: Not on file  . Ran Out of Food in the Last Year: Not on file  Transportation Needs:   . Lack of Transportation (Medical): Not on file  . Lack of Transportation (Non-Medical): Not on file  Physical Activity:   . Days of Exercise per Week: Not on file  . Minutes of Exercise per Session: Not on file  Stress:   . Feeling of Stress : Not on file  Social Connections:   . Frequency of Communication with Friends and  Family: Not on file  . Frequency of Social Gatherings with Friends and Family: Not on file  . Attends Religious Services: Not on file  . Active Member of Clubs or Organizations: Not on file  . Attends Archivist Meetings: Not on file  . Marital Status: Not on file  Intimate Partner Violence:   . Fear of Current or Ex-Partner: Not on file  . Emotionally Abused: Not on file  . Physically Abused: Not on file  . Sexually Abused: Not on file    Family History  Problem Relation Age of Onset  . Aortic aneurysm Father   . Heart disease Father   . Hypertension Father   . Hyperlipidemia Father   . Lung disease Sister   . Breast cancer Sister   . Pancreatic cancer Mother   . Hyperlipidemia Other   . Colon cancer Sister   . Breast cancer Sister   . Colon polyps Sister   . Esophageal cancer Neg Hx   . Stomach cancer Neg Hx   . Rectal cancer Neg Hx     ROS: no fevers or chills, productive cough, hemoptysis, dysphasia, odynophagia, melena, hematochezia, dysuria, hematuria, rash, seizure activity, orthopnea, PND, pedal edema, claudication. Remaining systems are negative.  Physical Exam: Well-developed well-nourished in no acute distress.  Skin is warm and dry.  HEENT is normal.  Neck is supple.  Chest is clear to auscultation with normal expansion.  Cardiovascular exam is regular rate and rhythm.  Abdominal exam nontender or distended. No masses palpated. Extremities show no edema. neuro grossly intact  ECG-sinus bradycardia at a rate of 54, right bundle branch block, cannot rule out septal infarct. Personally reviewed  [name]  1 cardiomyopathy-LV function has normalized on most recent echocardiogram.  2 previously dilated aortic root-not evident on most recent echocardiogram.  3 palpitations-symptoms are controlled.  Beta-blocker discontinued previously due to bradycardia.  4 hyperlipidemia-continue statin.  5 tricuspid regurgitation-moderate on most recent echo.   Given patient's age we would like to be conservative.  Kirk Ruths, MD

## 2020-04-16 ENCOUNTER — Other Ambulatory Visit: Payer: Self-pay

## 2020-04-16 ENCOUNTER — Encounter: Payer: Self-pay | Admitting: Cardiology

## 2020-04-16 ENCOUNTER — Ambulatory Visit: Payer: Medicare HMO | Admitting: Cardiology

## 2020-04-16 VITALS — BP 122/74 | HR 54 | Ht 72.0 in | Wt 181.0 lb

## 2020-04-16 DIAGNOSIS — E7800 Pure hypercholesterolemia, unspecified: Secondary | ICD-10-CM

## 2020-04-16 DIAGNOSIS — I420 Dilated cardiomyopathy: Secondary | ICD-10-CM

## 2020-04-16 DIAGNOSIS — I77810 Thoracic aortic ectasia: Secondary | ICD-10-CM

## 2020-04-16 DIAGNOSIS — R002 Palpitations: Secondary | ICD-10-CM | POA: Diagnosis not present

## 2020-04-16 NOTE — Patient Instructions (Signed)

## 2020-06-22 ENCOUNTER — Encounter: Payer: Self-pay | Admitting: Family Medicine

## 2020-06-22 ENCOUNTER — Telehealth (INDEPENDENT_AMBULATORY_CARE_PROVIDER_SITE_OTHER): Payer: Medicare HMO | Admitting: Family Medicine

## 2020-06-22 VITALS — Temp 96.0°F

## 2020-06-22 DIAGNOSIS — U071 COVID-19: Secondary | ICD-10-CM

## 2020-06-22 NOTE — Progress Notes (Addendum)
Virtual Visit via Video   I connected with patient on 06/22/20 at 12:30 PM EST by a video enabled telemedicine application and verified that I am speaking with the correct person using two identifiers.  Location patient: Home Location provider: [facility] Office Persons participating in the virtual visit: Patient, Provider, [place] (Sabrina M)  I discussed the limitations of evaluation and management by telemedicine and the availability of in person appointments. The patient expressed understanding and agreed to proceed.  Interactive audio and video telecommunications were attempted between this provider and patient, however failed, due to patient having technical difficulties OR patient did not have access to video capability.  We continued and completed visit with audio only.   Subjective:   HPI:   COVID- pt reports feeling badly for most of the weekend.  He got a home test last night and tested +.  Having severe headache, some cough.  Tylenol w/ some relief.  Denies chest tightness or SOB.  Able to walk around the house w/o difficulty.  Denies fevers/chills/body aches.  sxs started as a sore throat but this has improved.  Denies sinus congestion.  No known sick contacts.  Taking OTC decongestant.  ROS:   See pertinent positives and negatives per HPI.  Patient Active Problem List   Diagnosis Date Noted  . Overweight (BMI 25.0-29.9) 12/11/2018  . Physical exam 05/28/2017  . Allergic rhinitis due to pollen 08/28/2016  . Trichiasis of eyelid 02/14/2012  . Cardiomyopathy ([place]) 01/26/2012  . Obstructive bronchiectasis ([place]) 01/26/2012  . Tricuspid regurgitation 12/06/2011  . Aneurysm of thoracic aorta ([place]) 11/06/2008  . CARDIOVASCULAR STUDIES, ABNORMAL 11/06/2008  . Hyperlipidemia 11/05/2008  . MITRAL REGURGITATION 11/05/2008  . MITRAL VALVE PROLAPSE 11/05/2008  . BUNDLE BRANCH BLOCK, RIGHT 11/05/2008  . PALPITATIONS 11/05/2008    Social History   Tobacco Use  .  Smoking status: Former Smoker    Types: Cigarettes    Quit date: 1982    Years since quitting: 40.0  . Smokeless tobacco: Never Used  . Tobacco comment: quit 80's  Substance Use Topics  . Alcohol use: No    Alcohol/week: 0.0 standard drinks    Current Outpatient Medications:  .  aspirin 81 MG EC tablet, Take 81 mg by mouth daily., Disp: , Rfl:  .  atorvastatin (LIPITOR) 40 MG tablet, TAKE 1 TABLET (40 MG TOTAL) BY MOUTH DAILY AT 6 PM., Disp: 90 tablet, Rfl: 1 .  Calcium Carbonate-Vitamin D 600-400 MG-UNIT tablet, Take 1 tablet by mouth 2 (two) times daily., Disp: , Rfl:  .  carboxymethylcellulose (REFRESH PLUS) 0.5 % SOLN, 1 drop 3 (three) times daily as needed., Disp: , Rfl:  .  co-enzyme Q-10 30 MG capsule, Take 30 mg by mouth 2 (two) times daily., Disp: , Rfl:  .  fish oil-omega-3 fatty acids 1000 MG capsule, Take 1 g by mouth daily. Nordic naturals omega 3 TID, Disp: , Rfl:  .  fluticasone (FLONASE) 50 MCG/ACT nasal spray, USE 2 SPRAYS IN EACH NOSTRIL DAILY., Disp: 48 g, Rfl: 1 .  TURMERIC PO, Take 2,000 mg by mouth daily., Disp: , Rfl:  .  Wheat Dextrin (EQ FIBER POWDER PO), Take by mouth., Disp: , Rfl:  .  Multiple Vitamins-Minerals (VISION-VITE PRESERVE PO), Take by mouth. 2 per day (Patient not taking: Reported on 06/22/2020), Disp: , Rfl:   Allergies  Allergen Reactions  . Prednisone     Objective:   Temp (!) 96 F (35.6 C) (Temporal)  Pt is able to  speak clearly, coherently without shortness of breath or increased work of breathing. Thought process is linear.  Mood is appropriate.   Assessment and Plan:   COVID- new.  Pt tested + last night after developing sxs on Saturday.  Has been vaccinated and boosted.  No known sick contacts.  Able to eat and drink w/o difficulty.  No CP or SOB.  + HA but this improves w/ tylenol and decongestant.  Reviewed supportive care w/ pt and referred him for possible treatment given age and comorbidities.  Pt is to notify me if any of his  sxs change or worsen.  Pt expressed understanding and is in agreement w/ plan.    Annye Asa, MD 06/22/2020  Time spent with the patient: 13 minutes, of which >50% was spent in obtaining information about symptoms, reviewing previous labs, evaluations, and treatments, counseling about condition (please see the discussed topics above), and developing a plan to further investigate it; had a number of questions which I addressed.

## 2020-06-22 NOTE — Progress Notes (Signed)
I connected with  [name] [name] on 06/22/20 by a video enabled telemedicine application and verified that I am speaking with the correct person using two identifiers.   I discussed the limitations of evaluation and management by telemedicine. The patient expressed understanding and agreed to proceed.

## 2020-06-23 ENCOUNTER — Telehealth: Payer: Self-pay | Admitting: Family Medicine

## 2020-06-23 NOTE — Telephone Encounter (Signed)
I did not mean to sign this encounter - please send to Dr. Birdie Riddle for advice

## 2020-06-23 NOTE — Telephone Encounter (Signed)
Patient tested positive for covid - he has not heard anything about the infusions - is there any thing therapeutic that he can do that would help.  Please advise.

## 2020-06-24 ENCOUNTER — Telehealth: Payer: Self-pay

## 2020-06-24 NOTE — Telephone Encounter (Signed)
Called and spoke with patient about his symptoms. He said that he is not having much of the cough and congestion. He is taking the vitamin C and D. I advised him per Dr. Birdie Riddle to add some zinc. He will add delsym and robitussin if he feel he needs it. Patient understands that if symptoms worsen he is to go th the ER.

## 2020-06-24 NOTE — Telephone Encounter (Signed)
Called and spoke with patient about covid symptoms. See phone note for full note.

## 2020-06-24 NOTE — Telephone Encounter (Signed)
He should drink plenty of fluids, rest, take OTC Vit D, Vit C, and Zinc.  He should use OTC Delsym or Robitussin for cough/chest congestion.  He can take an OTC decongestant if struggling w/ nasal/head congestion.  If he is short of breath, has chest tightness, dizziness, or is unable to eat or drink- he must go to the ER for evaluation

## 2020-06-25 ENCOUNTER — Telehealth: Payer: Self-pay

## 2020-06-25 ENCOUNTER — Other Ambulatory Visit: Payer: Self-pay | Admitting: Family Medicine

## 2020-06-25 MED ORDER — NIRMATRELVIR/RITONAVIR (PAXLOVID)TABLET: 3.0000 | ORAL_TABLET | Freq: Two times a day (BID) | ORAL | 0 refills | Status: AC

## 2020-06-25 MED ORDER — NIRMATRELVIR/RITONAVIR (PAXLOVID)TABLET: 3.0000 | ORAL_TABLET | Freq: Two times a day (BID) | ORAL | 0 refills | Status: DC

## 2020-06-25 NOTE — Telephone Encounter (Signed)
I am happy to send in the prescription for Paxlovid, but it's nearly impossible to find.  Please let him know that I have no idea if any of the local pharmacies have any in stock and I don't know what the cost may be.

## 2020-06-25 NOTE — Progress Notes (Signed)
Initial eRx failed.  Retry.

## 2020-06-25 NOTE — Telephone Encounter (Signed)
Spoke with patient about  the Plaxovid. Per Dr. Birdie Riddle she has put it through to the pharmacy several times and it would not go through unlike his other meds that did. Patient understood and is ok with it.

## 2020-06-25 NOTE — Telephone Encounter (Signed)
Spoke with patient to let him know that medication would not through to pharmacy. Patient understood.

## 2020-06-25 NOTE — Telephone Encounter (Signed)
Patient states he has seen Dr. Birdie Riddle virtually for [place].    States he has heard about new aniti viral meds from [facility] and [employer].  Patient wanted to know if this would be something available that he could possibly take?

## 2020-09-19 ENCOUNTER — Other Ambulatory Visit: Payer: Self-pay | Admitting: Family Medicine

## 2020-09-19 DIAGNOSIS — E785 Hyperlipidemia, unspecified: Secondary | ICD-10-CM

## 2020-09-24 NOTE — Progress Notes (Signed)
Subjective:   [name] [name] is a 87 y.o. male who presents for Medicare Annual/Subsequent preventive examination.   Review of Systems     Cardiac Risk Factors include: advanced age (>72men, >39 women);dyslipidemia     Objective:    Today's Vitals   09/27/20 0803  BP: 118/70  Pulse: 66  Resp: 16  Temp: 97.7 F (36.5 C)  TempSrc: Temporal  SpO2: 97%  Weight: 178 lb (80.7 kg)  Height: 5\' 11"  (1.803 m)   Body mass index is 24.83 kg/m.  Advanced Directives 09/27/2020 12/08/2019 03/06/2019 02/28/2018 09/20/2017 05/28/2017 02/22/2017  Does Patient Have a Medical Advance Directive? Yes Yes Yes Yes No Yes Yes  Type of Paramedic of [place];Living will [place];Living will [place];Living will [place];Living will - [place] will  Does patient want to make changes to medical advance directive? - No - Patient declined - - - - -  Copy of [place] in Chart? Yes - validated most recent copy scanned in chart (See row information) - Yes - validated most recent copy scanned in chart (See row information) Yes - - -  Would patient like information on creating a medical advance directive? - No - Patient declined - - No - Patient declined - -    Current Medications (verified) Outpatient Encounter Medications as of 09/27/2020  Medication Sig  . aspirin 81 MG EC tablet Take 81 mg by mouth daily.  Marland Kitchen atorvastatin (LIPITOR) 40 MG tablet TAKE 1 TABLET (40 MG TOTAL) BY MOUTH DAILY AT 6 PM.  . Calcium Carbonate-Vitamin D 600-400 MG-UNIT tablet Take 1 tablet by mouth 2 (two) times daily.  . carboxymethylcellulose (REFRESH PLUS) 0.5 % SOLN 1 drop 3 (three) times daily as needed.  Marland Kitchen co-enzyme Q-10 30 MG capsule Take 30 mg by mouth 2 (two) times daily.  . fish oil-omega-3 fatty acids 1000 MG capsule Take 1 g by mouth daily. Nordic naturals omega 3 TID  . fluticasone (FLONASE) 50  MCG/ACT nasal spray USE 2 SPRAYS IN EACH NOSTRIL DAILY.  Marland Kitchen TURMERIC PO Take 2,000 mg by mouth daily.  . Wheat Dextrin (EQ FIBER POWDER PO) Take by mouth.  . Multiple Vitamins-Minerals (VISION-VITE PRESERVE PO) Take by mouth. 2 per day (Patient not taking: No sig reported)   No facility-administered encounter medications on file as of 09/27/2020.    Allergies (verified) Prednisone   History: Past Medical History:  Diagnosis Date  . Benign tumor of lung   . BPH (benign prostatic hyperplasia)   . Cardiomyopathy ([place])   . Cataract   . Dilated aortic root ([facility])    Hx of mild  . Hyperlipidemia   . Mitral valve prolapse    irregular heart beat  . Palpitations   . Pure hypercholesterolemia   . RBBB (right bundle branch block)    Past Surgical History:  Procedure Laterality Date  . BLEPHAROPLASTY Bilateral   . CATARACT EXTRACTION, BILATERAL    . COSMETIC SURGERY    . Excision on neck     Excision of 2 cm soft tissue mass of left neck  . INGUINAL HERNIA REPAIR Bilateral   . Resection of Lung Right    Hx of, right lung secondary to benign tumor  . SHOULDER ARTHROSCOPY Left   . testicular hernia    . VASECTOMY     Family History  Problem Relation Age of Onset  . Aortic aneurysm Father   .  Heart disease Father   . Hypertension Father   . Hyperlipidemia Father   . Lung disease Sister   . Breast cancer Sister   . Pancreatic cancer Mother   . Hyperlipidemia Other   . Colon cancer Sister   . Breast cancer Sister   . Colon polyps Sister   . Esophageal cancer Neg Hx   . Stomach cancer Neg Hx   . Rectal cancer Neg Hx    Social History   Socioeconomic History  . Marital status: Married    Spouse name: Not on file  . Number of children: 1  . Years of education: Not on file  . Highest education level: Not on file  Occupational History  . Occupation: Farmer-retired  Tobacco Use  . Smoking status: Former Smoker    Types: Cigarettes    Quit date: 1982    Years since  quitting: 40.3  . Smokeless tobacco: Never Used  . Tobacco comment: quit 80's  Vaping Use  . Vaping Use: Never used  Substance and Sexual Activity  . Alcohol use: No    Alcohol/week: 0.0 standard drinks  . Drug use: No  . Sexual activity: Never  Other Topics Concern  . Not on file  Social History Narrative   Exercise 20-30 min walks. Patient is married. Education: [employer].    Social Determinants of Health   Financial Resource Strain: Low Risk   . Difficulty of Paying Living Expenses: Not hard at all  Food Insecurity: No Food Insecurity  . Worried About Charity fundraiser in the Last Year: Never true  . Ran Out of Food in the Last Year: Never true  Transportation Needs: No Transportation Needs  . Lack of Transportation (Medical): No  . Lack of Transportation (Non-Medical): No  Physical Activity: Insufficiently Active  . Days of Exercise per Week: 7 days  . Minutes of Exercise per Session: 20 min  Stress: No Stress Concern Present  . Feeling of Stress : Not at all  Social Connections: Moderately Integrated  . Frequency of Communication with Friends and Family: More than three times a week  . Frequency of Social Gatherings with Friends and Family: More than three times a week  . Attends Religious Services: More than 4 times per year  . Active Member of Clubs or Organizations: No  . Attends Archivist Meetings: Never  . Marital Status: Married    Tobacco Counseling Counseling given: Not Answered Comment: quit 80's   Clinical Intake:  Pre-visit preparation completed: Yes  Pain : No/denies pain     Nutritional Status: BMI of 19-24  Normal Nutritional Risks: None Diabetes: No  How often do you need to have someone help you when you read instructions, pamphlets, or other written materials from your doctor or pharmacy?: 1 - Never  Diabetic?No  Interpreter Needed?: No  Information entered by :: Caroleen Hamman LPN   Activities of Daily Living In your  present state of health, do you have any difficulty performing the following activities: 09/27/2020 06/22/2020  Hearing? N N  Vision? N N  Difficulty concentrating or making decisions? Y N  Comment occasionally forgets names -  Walking or climbing stairs? N N  Dressing or bathing? N N  Doing errands, shopping? N N  Preparing Food and eating ? N -  Using the Toilet? N -  In the past six months, have you accidently leaked urine? N -  Do you have problems with loss of bowel control? N -  Managing your Medications? N -  Managing your Finances? N -  Housekeeping or managing your Housekeeping? N -  Some recent data might be hidden    Patient Care Team: Midge Minium, MD as PCP - General (Family Medicine) Stanford Breed, Denice Bors, MD (Cardiology) Kathie Rhodes, MD (Inactive) (Urology) Tanda Rockers, MD as Consulting Physician (Pulmonary Disease) Allyn Kenner, MD (Dermatology) Loletha Carrow Kirke Corin, MD as Consulting Physician (Gastroenterology)  Indicate any recent Medical Services you may have received from other than [facility] providers in the past year (date may be approximate).     Assessment:   This is a routine wellness examination for [place].  Hearing/Vision screen  Hearing Screening   125Hz  250Hz  500Hz  1000Hz  2000Hz  3000Hz  4000Hz  6000Hz  8000Hz   Right ear:           Left ear:           Comments: Mild hearing aids  Vision Screening Comments: Wears glasses Last eye exam-2021  Dietary issues and exercise activities discussed: Current Exercise Habits: Home exercise routine, Type of exercise: walking, Time (Minutes): 20, Frequency (Times/Week): 7, Weekly Exercise (Minutes/Week): 140, Intensity: Mild, Exercise limited by: None identified  Goals      Patient Stated   .  patient (pt-stated)      Maintain current health by staying active.       Depression Screen PHQ 2/9 Scores 09/27/2020 06/22/2020 03/06/2019 12/11/2018 06/04/2018 02/28/2018 11/26/2017  PHQ - 2 Score 0 0 0 0 0 0 0  PHQ- 9  Score - 0 - 0 0 - 0    Fall Risk Fall Risk  09/27/2020 06/22/2020 03/06/2019 03/06/2019 12/11/2018  Falls in the past year? 1 0 0 0 0  Number falls in past yr: 0 0 0 0 0  Injury with Fall? 0 0 0 0 0  Risk for fall due to : - No Fall Risks - - -  Follow up Falls prevention discussed - - Falls evaluation completed -    FALL RISK PREVENTION PERTAINING TO THE HOME:  Any stairs in or around the home? Yes  If so, are there any without handrails? No  Home free of loose throw rugs in walkways, pet beds, electrical cords, etc? Yes  Adequate lighting in your home to reduce risk of falls? Yes   ASSISTIVE DEVICES UTILIZED TO PREVENT FALLS:  Life alert? No  Use of a cane, walker or w/c? No  Grab bars in the bathroom? Yes  Shower chair or bench in shower? No  Elevated toilet seat or a handicapped toilet? No   TIMED UP AND GO:  Was the test performed? Yes .  Length of time to ambulate 10 feet: 10 sec.   Gait slow and steady without use of assistive device  Cognitive Function: MMSE - Mini Mental State Exam 02/28/2018  Orientation to time 3  Orientation to Place 5  Registration 3  Attention/ Calculation 3  Recall 0  Language- name 2 objects 2  Language- repeat 1  Language- follow 3 step command 3  Language- read & follow direction 1  Write a sentence 1  Copy design 1  Total score 23     6CIT Screen 09/27/2020  What Year? 0 points  What month? 0 points  What time? 0 points  Count back from 20 0 points  Months in reverse 0 points  Repeat phrase 2 points  Total Score 2    Immunizations Immunization History  Administered Date(s) Administered  . Fluad Quad(high Dose 65+)  02/03/2020  . Influenza Split 06/05/2009, 02/22/2013, 02/26/2015  . Influenza, High Dose Seasonal PF 03/02/2014, 02/18/2018, 01/27/2019  . Influenza-Unspecified 02/01/2016, 01/03/2017  . PFIZER(Purple Top)SARS-COV-2 Vaccination 07/26/2019, 08/19/2019, 04/13/2020  . Pneumococcal Conjugate-13 12/09/2013  .  Pneumococcal Polysaccharide-23 06/05/2004, 08/28/2016  . Td 09/21/2009  . Tdap 06/05/2016  . Zoster 06/05/2008    TDAP status: Due, Education has been provided regarding the importance of this vaccine. Advised may receive this vaccine at local pharmacy or Health Dept. Aware to provide a copy of the vaccination record if obtained from local pharmacy or Health Dept. Verbalized acceptance and understanding.  Flu Vaccine status: Up to date  Pneumococcal vaccine status: Up to date  Covid-19 vaccine status: Completed vaccines  Qualifies for Shingles Vaccine? Yes   Zostavax completed Yes   Shingrix Completed?: No.    Education has been provided regarding the importance of this vaccine. Patient has been advised to call insurance company to determine out of pocket expense if they have not yet received this vaccine. Advised may also receive vaccine at local pharmacy or Health Dept. Verbalized acceptance and understanding.  Screening Tests Health Maintenance  Topic Date Due  . INFLUENZA VACCINE  01/03/2021  . TETANUS/TDAP  06/05/2026  . COVID-19 Vaccine  Completed  . PNA vac Low Risk Adult  Completed  . HPV VACCINES  Aged Out    Health Maintenance  There are no preventive care reminders to display for this patient.  Colorectal cancer screening: No longer required.   Lung Cancer Screening: (Low Dose CT Chest recommended if Age 72-80 years, 30 pack-year currently smoking OR have quit w/in 15years.) does not qualify.    Additional Screening:  Hepatitis C Screening: does not qualify  Vision Screening: Recommended annual ophthalmology exams for early detection of glaucoma and other disorders of the eye. Is the patient up to date with their annual eye exam?  Yes  Who is the provider or what is the name of the office in which the patient attends annual eye exams? Unsure of name   Dental Screening: Recommended annual dental exams for proper oral hygiene  Community Resource Referral /  Chronic Care Management: CRR required this visit?  No   CCM required this visit?  No      Plan:     I have personally reviewed and noted the following in the patient's chart:   . Medical and social history . Use of alcohol, tobacco or illicit drugs  . Current medications and supplements . Functional ability and status . Nutritional status . Physical activity . Advanced directives . List of other physicians . Hospitalizations, surgeries, and ER visits in previous 12 months . Vitals . Screenings to include cognitive, depression, and falls . Referrals and appointments  In addition, I have reviewed and discussed with patient certain preventive protocols, quality metrics, and best practice recommendations. A written personalized care plan for preventive services as well as general preventive health recommendations were provided to patient.   Patient would like to access avs on mychart.  Marta Antu, LPN   [id number]  Nurse health Advisor  Nurse Notes: None

## 2020-09-27 ENCOUNTER — Ambulatory Visit (INDEPENDENT_AMBULATORY_CARE_PROVIDER_SITE_OTHER): Payer: Medicare HMO

## 2020-09-27 ENCOUNTER — Other Ambulatory Visit: Payer: Self-pay

## 2020-09-27 VITALS — BP 118/70 | HR 66 | Temp 97.7°F | Resp 16 | Ht 71.0 in | Wt 178.0 lb

## 2020-09-27 DIAGNOSIS — Z0000 Encounter for general adult medical examination without abnormal findings: Secondary | ICD-10-CM | POA: Diagnosis not present

## 2020-09-27 NOTE — Patient Instructions (Signed)
[name] [name] , Thank you for taking time to come for your Medicare Wellness Visit. I appreciate your ongoing commitment to your health goals. Please review the following plan we discussed and let me know if I can assist you in the future.   Screening recommendations/referrals: Colonoscopy: No longer required Recommended yearly ophthalmology/optometry visit for glaucoma screening and checkup Recommended yearly dental visit for hygiene and checkup  Vaccinations: Influenza vaccine: Up to date Pneumococcal vaccine: Completed vaccines Tdap vaccine: Up to date-Due-06/05/2026 Shingles vaccine: Completed vaccines. Please provide documentation of Shingrix vaccine if available.   Covid-19: Completed vaccines  Advanced directives: Copy in chart  Conditions/risks identified: See problem list  Next appointment: Follow up in one year for your annual wellness visit. 10/03/2021 @ 8:15  Preventive Care 65 Years and Older, Male Preventive care refers to lifestyle choices and visits with your health care provider that can promote health and wellness. What does preventive care include?  A yearly physical exam. This is also called an annual well check.  Dental exams once or twice a year.  Routine eye exams. Ask your health care provider how often you should have your eyes checked.  Personal lifestyle choices, including:  Daily care of your teeth and gums.  Regular physical activity.  Eating a healthy diet.  Avoiding tobacco and drug use.  Limiting alcohol use.  Practicing safe sex.  Taking low doses of aspirin every day.  Taking vitamin and mineral supplements as recommended by your health care provider. What happens during an annual well check? The services and screenings done by your health care provider during your annual well check will depend on your age, overall health, lifestyle risk factors, and family history of disease. Counseling  Your health care provider may ask you questions  about your:  Alcohol use.  Tobacco use.  Drug use.  Emotional well-being.  Home and relationship well-being.  Sexual activity.  Eating habits.  History of falls.  Memory and ability to understand (cognition).  Work and work Statistician. Screening  You may have the following tests or measurements:  Height, weight, and BMI.  Blood pressure.  Lipid and cholesterol levels. These may be checked every 5 years, or more frequently if you are over 56 years old.  Skin check.  Lung cancer screening. You may have this screening every year starting at age 76 if you have a 30-pack-year history of smoking and currently smoke or have quit within the past 15 years.  Fecal occult blood test (FOBT) of the stool. You may have this test every year starting at age 23.  Flexible sigmoidoscopy or colonoscopy. You may have a sigmoidoscopy every 5 years or a colonoscopy every 10 years starting at age 96.  Prostate cancer screening. Recommendations will vary depending on your family history and other risks.  Hepatitis C blood test.  Hepatitis B blood test.  Sexually transmitted disease (STD) testing.  Diabetes screening. This is done by checking your blood sugar (glucose) after you have not eaten for a while (fasting). You may have this done every 1-3 years.  Abdominal aortic aneurysm (AAA) screening. You may need this if you are a current or former smoker.  Osteoporosis. You may be screened starting at age 65 if you are at high risk. Talk with your health care provider about your test results, treatment options, and if necessary, the need for more tests. Vaccines  Your health care provider may recommend certain vaccines, such as:  Influenza vaccine. This is recommended every year.  Tetanus, diphtheria, and acellular pertussis (Tdap, Td) vaccine. You may need a Td booster every 10 years.  Zoster vaccine. You may need this after age 69.  Pneumococcal 13-valent conjugate (PCV13)  vaccine. One dose is recommended after age 46.  Pneumococcal polysaccharide (PPSV23) vaccine. One dose is recommended after age 18. Talk to your health care provider about which screenings and vaccines you need and how often you need them. This information is not intended to replace advice given to you by your health care provider. Make sure you discuss any questions you have with your health care provider. Document Released: 06/18/2015 Document Revised: 02/09/2016 Document Reviewed: 03/23/2015 Elsevier Interactive Patient Education  2017 [place] Prevention in the Home Falls can cause injuries. They can happen to people of all ages. There are many things you can do to make your home safe and to help prevent falls. What can I do on the outside of my home?  Regularly fix the edges of walkways and driveways and fix any cracks.  Remove anything that might make you trip as you walk through a door, such as a raised step or threshold.  Trim any bushes or trees on the path to your home.  Use bright outdoor lighting.  Clear any walking paths of anything that might make someone trip, such as rocks or tools.  Regularly check to see if handrails are loose or broken. Make sure that both sides of any steps have handrails.  Any raised decks and porches should have guardrails on the edges.  Have any leaves, snow, or ice cleared regularly.  Use sand or salt on walking paths during winter.  Clean up any spills in your garage right away. This includes oil or grease spills. What can I do in the bathroom?  Use night lights.  Install grab bars by the toilet and in the tub and shower. Do not use towel bars as grab bars.  Use non-skid mats or decals in the tub or shower.  If you need to sit down in the shower, use a plastic, non-slip stool.  Keep the floor dry. Clean up any water that spills on the floor as soon as it happens.  Remove soap buildup in the tub or shower  regularly.  Attach bath mats securely with double-sided non-slip rug tape.  Do not have throw rugs and other things on the floor that can make you trip. What can I do in the bedroom?  Use night lights.  Make sure that you have a light by your bed that is easy to reach.  Do not use any sheets or blankets that are too big for your bed. They should not hang down onto the floor.  Have a firm chair that has side arms. You can use this for support while you get dressed.  Do not have throw rugs and other things on the floor that can make you trip. What can I do in the kitchen?  Clean up any spills right away.  Avoid walking on wet floors.  Keep items that you use a lot in easy-to-reach places.  If you need to reach something above you, use a strong step stool that has a grab bar.  Keep electrical cords out of the way.  Do not use floor polish or wax that makes floors slippery. If you must use wax, use non-skid floor wax.  Do not have throw rugs and other things on the floor that can make you trip. What can I do  with my stairs?  Do not leave any items on the stairs.  Make sure that there are handrails on both sides of the stairs and use them. Fix handrails that are broken or loose. Make sure that handrails are as long as the stairways.  Check any carpeting to make sure that it is firmly attached to the stairs. Fix any carpet that is loose or worn.  Avoid having throw rugs at the top or bottom of the stairs. If you do have throw rugs, attach them to the floor with carpet tape.  Make sure that you have a light switch at the top of the stairs and the bottom of the stairs. If you do not have them, ask someone to add them for you. What else can I do to help prevent falls?  Wear shoes that:  Do not have high heels.  Have rubber bottoms.  Are comfortable and fit you well.  Are closed at the toe. Do not wear sandals.  If you use a stepladder:  Make sure that it is fully  opened. Do not climb a closed stepladder.  Make sure that both sides of the stepladder are locked into place.  Ask someone to hold it for you, if possible.  Clearly mark and make sure that you can see:  Any grab bars or handrails.  First and last steps.  Where the edge of each step is.  Use tools that help you move around (mobility aids) if they are needed. These include:  Canes.  Walkers.  Scooters.  Crutches.  Turn on the lights when you go into a dark area. Replace any light bulbs as soon as they burn out.  Set up your furniture so you have a clear path. Avoid moving your furniture around.  If any of your floors are uneven, fix them.  If there are any pets around you, be aware of where they are.  Review your medicines with your doctor. Some medicines can make you feel dizzy. This can increase your chance of falling. Ask your doctor what other things that you can do to help prevent falls. This information is not intended to replace advice given to you by your health care provider. Make sure you discuss any questions you have with your health care provider. Document Released: 03/18/2009 Document Revised: 10/28/2015 Document Reviewed: 06/26/2014 Elsevier Interactive Patient Education  2017 [employer].

## 2020-12-01 ENCOUNTER — Encounter: Payer: Self-pay | Admitting: *Deleted

## 2020-12-16 ENCOUNTER — Other Ambulatory Visit: Payer: Self-pay | Admitting: Family Medicine

## 2020-12-16 DIAGNOSIS — E785 Hyperlipidemia, unspecified: Secondary | ICD-10-CM

## 2020-12-24 ENCOUNTER — Other Ambulatory Visit (HOSPITAL_COMMUNITY): Payer: Self-pay | Admitting: *Deleted

## 2020-12-24 ENCOUNTER — Other Ambulatory Visit (HOSPITAL_COMMUNITY): Payer: Self-pay | Admitting: Internal Medicine

## 2020-12-24 DIAGNOSIS — R911 Solitary pulmonary nodule: Secondary | ICD-10-CM

## 2020-12-24 DIAGNOSIS — R918 Other nonspecific abnormal finding of lung field: Secondary | ICD-10-CM

## 2021-01-14 ENCOUNTER — Other Ambulatory Visit: Payer: Self-pay

## 2021-01-14 ENCOUNTER — Other Ambulatory Visit (HOSPITAL_COMMUNITY): Payer: Self-pay | Admitting: Internal Medicine

## 2021-01-14 ENCOUNTER — Encounter (HOSPITAL_COMMUNITY): Payer: Self-pay

## 2021-01-14 ENCOUNTER — Ambulatory Visit (HOSPITAL_COMMUNITY): Payer: Medicare HMO

## 2021-01-14 ENCOUNTER — Other Ambulatory Visit: Payer: Self-pay | Admitting: Internal Medicine

## 2021-01-14 DIAGNOSIS — R911 Solitary pulmonary nodule: Secondary | ICD-10-CM

## 2021-01-24 ENCOUNTER — Encounter: Payer: Medicare HMO | Admitting: Thoracic Surgery (Cardiothoracic Vascular Surgery)

## 2021-01-27 ENCOUNTER — Other Ambulatory Visit: Payer: Self-pay

## 2021-01-27 ENCOUNTER — Encounter (HOSPITAL_COMMUNITY)
Admission: RE | Admit: 2021-01-27 | Discharge: 2021-01-27 | Disposition: A | Discharge status: Home / Self Care | Payer: No Typology Code available for payment source | Source: Ambulatory Visit | Attending: Internal Medicine | Admitting: Internal Medicine

## 2021-01-27 DIAGNOSIS — R911 Solitary pulmonary nodule: Secondary | ICD-10-CM | POA: Diagnosis not present

## 2021-01-27 LAB — GLUCOSE, CAPILLARY: Glucose-Capillary: 97 mg/dL (ref 70–99)

## 2021-01-27 MED ORDER — FLUDEOXYGLUCOSE F - 18 (FDG) INJECTION
8.5000 | Freq: Once | INTRAVENOUS | Status: AC | PRN
  Administered 2021-01-27: 8.5 via INTRAVENOUS

## 2021-02-01 ENCOUNTER — Encounter (HOSPITAL_COMMUNITY): Payer: Self-pay | Admitting: Radiology

## 2021-02-01 NOTE — Progress Notes (Signed)
Patient Name  [name] [name], [name] [name] Legal Sex  Male DOB  05-Jan-1931 SSN  [id number] Address  [id number]  [place] [address] Phone  [phone] (Home) *Preferred*  [phone] (Mobile)    RE: CT Biopsy Received: Today Arne Cleveland, MD  Garth Bigness D Slightly progressive areas of subpleural thickening in the right  lower lobe with mild associated hypermetabolism. Question prior  pleurodesis and/or postoperative scarring. Malignancy is not  favored. Consider follow-up CT chest in 3-6 months.   Has this been [facility] at [facility]?   Thx  DDH        Previous Messages   ----- Message -----  From: Garth Bigness D  Sent: 01/28/2021   8:18 AM EDT  To: Ir Procedure Requests  Subject: CT Biopsy                                       Procedure:  CT Biopsy   Reason:  Mass of right lung, Solitary pulmonary nodule, concern for malignancy, new enlarging lung mass of right lung, (enlarging costophrenic mass)   History:  NM PET in computer   Provider:  Christella Scheuermann   Provider Contact:  [phone]

## 2021-02-02 ENCOUNTER — Encounter (HOSPITAL_COMMUNITY): Payer: Self-pay | Admitting: Radiology

## 2021-02-02 NOTE — Progress Notes (Signed)
Patient Name  [name] [name], Days Legal Sex  Male DOB  11/01/1930 SSN  [id number] Address  [id number]  [place] [address] Phone  [phone] (Home) *Preferred*  [phone] (Mobile)    RE: CT Biopsy Received: Today Arne Cleveland, MD  Garth Bigness D Multidisiplinary thoracic oncology conference   aka tumor board  but its just for [facility] pts   Anyway I did speak to Dr Tamala Julian just now and the biopsy request is CANCELLED   Thx  DDH        Previous Messages   ----- Message -----  From: Jillyn Hidden  Sent: 02/01/2021   8:46 AM EDT  To: Arne Cleveland, MD  Subject: RE: CT Biopsy                                   What is [place], I have never heard that before? This is a [facility] patient.  ----- Message -----  From: Arne Cleveland, MD  Sent: 02/01/2021   8:34 AM EDT  To: Jillyn Hidden  Subject: RE: CT Biopsy                                   Slightly progressive areas of subpleural thickening in the right  lower lobe with mild associated hypermetabolism. Question prior  pleurodesis and/or postoperative scarring. Malignancy is not  favored. Consider follow-up CT chest in 3-6 months.   Has this been presented at [facility]?   Thx  DDH     ----- Message -----  From: Garth Bigness D  Sent: 01/28/2021   8:18 AM EDT  To: Ir Procedure Requests  Subject: CT Biopsy                                       Procedure:  CT Biopsy   Reason:  Mass of right lung, Solitary pulmonary nodule, concern for malignancy, new enlarging lung mass of right lung, (enlarging costophrenic mass)   History:  NM PET in computer   Provider:  Christella Scheuermann   Provider Contact:  [phone]

## 2021-02-08 ENCOUNTER — Encounter: Payer: Self-pay | Admitting: Thoracic Surgery (Cardiothoracic Vascular Surgery)

## 2021-02-08 ENCOUNTER — Institutional Professional Consult (permissible substitution) (INDEPENDENT_AMBULATORY_CARE_PROVIDER_SITE_OTHER): Payer: No Typology Code available for payment source | Admitting: Thoracic Surgery (Cardiothoracic Vascular Surgery)

## 2021-02-08 ENCOUNTER — Other Ambulatory Visit: Payer: Self-pay

## 2021-02-08 VITALS — BP 143/77 | HR 75 | Resp 20 | Ht 71.0 in | Wt 175.0 lb

## 2021-02-08 DIAGNOSIS — R911 Solitary pulmonary nodule: Secondary | ICD-10-CM | POA: Diagnosis not present

## 2021-02-08 NOTE — Progress Notes (Signed)
PCP is [name] Minium, MD Referring Provider is [name] [name]*  Chief Complaint  Patient presents with   Lung Lesion    Initial surgical consult, PET 8/12, CT 7/18 & 5/16    HPI: [name] [name] is sent for consultation regarding abnormal CT and PET/CT.  [name] [name] is an 87 year old man with a remote history of tobacco abuse, prior right lung surgery for benign tumor, nonischemic cardiomyopathy, right bundle branch block, mitral valve prolapse, tricuspid regurgitation, mildly dilated aortic root, hyperlipidemia, and BPH.  He had a right lung operation by Dr. Redmond Pulling back in the 1980s for a benign lung tumor.  He recently has been followed at the [facility] with CT scans in November 2020, November 2021, May 2022, and July 2022.  His most recent scan showed some nodules that had resolved completely or improved compared to the previous scan.  In other areas there was an irregular subpleural opacity at the right costophrenic area that had shown a gradual increase in size since 2020.  There was an area of nodularity measuring 1.3 cm along the superior aspect of this area.  He had a PET/CT on 01/27/2021 which showed very mild uptake in this area.  There were some calcified hilar and mediastinal lymph nodes that also showed low-grade metabolic activity.  He is retired.  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  He remains fairly active and mows his own grass.  He denies any fevers, chills, or night sweats.  He used to have palpitations but has not had any recently.  He is not having any chest pain, pressure, or tightness.  He does have shortness of breath with activity that which has not changed recently.  A CT-guided needle biopsy was ordered but radiology requested thoracic surgical consultation before performing the procedure.  Past Medical History:  Diagnosis Date   Benign tumor of lung    BPH (benign prostatic hyperplasia)    Cardiomyopathy ([place])    Cataract    Dilated  aortic root (HCC)    Hx of mild   Hyperlipidemia    Mitral valve prolapse    irregular heart beat   Palpitations    Pure hypercholesterolemia    RBBB (right bundle branch block)     Past Surgical History:  Procedure Laterality Date   BLEPHAROPLASTY Bilateral    CATARACT EXTRACTION, BILATERAL     COSMETIC SURGERY     Excision on neck     Excision of 2 cm soft tissue mass of left neck   INGUINAL HERNIA REPAIR Bilateral    Resection of Lung Right    Hx of, right lung secondary to benign tumor   SHOULDER ARTHROSCOPY Left    testicular hernia     VASECTOMY      Family History  Problem Relation Age of Onset   Aortic aneurysm Father    Heart disease Father    Hypertension Father    Hyperlipidemia Father    Lung disease Sister    Breast cancer Sister    Pancreatic cancer Mother    Hyperlipidemia Other    Colon cancer Sister    Breast cancer Sister    Colon polyps Sister    Esophageal cancer Neg Hx    Stomach cancer Neg Hx    Rectal cancer Neg Hx     Social History Social History   Tobacco Use   Smoking status: Former    Types: Cigarettes    Quit date: 1982  Years since quitting: 40.7   Smokeless tobacco: Never   Tobacco comments:    quit 80's  Vaping Use   Vaping Use: Never used  Substance Use Topics   Alcohol use: No    Alcohol/week: 0.0 standard drinks   Drug use: No    Current Outpatient Medications  Medication Sig Dispense Refill   aspirin 81 MG EC tablet Take 81 mg by mouth daily.     atorvastatin (LIPITOR) 40 MG tablet TAKE 1 TABLET BY MOUTH DAILY AT 6 PM. 90 tablet 0   Calcium Carbonate-Vitamin D 600-400 MG-UNIT tablet Take 1 tablet by mouth 2 (two) times daily.     co-enzyme Q-10 30 MG capsule Take 30 mg by mouth 2 (two) times daily.     fish oil-omega-3 fatty acids 1000 MG capsule Take 1 g by mouth daily. Nordic naturals omega 3 TID     fluticasone (FLONASE) 50 MCG/ACT nasal spray USE 2 SPRAYS IN EACH NOSTRIL DAILY. 48 g 1   Multiple  Vitamins-Minerals (VISION-VITE PRESERVE PO) Take by mouth. 2 per day     TURMERIC PO Take 2,000 mg by mouth daily.     Wheat Dextrin (EQ FIBER POWDER PO) Take by mouth.     carboxymethylcellulose (REFRESH PLUS) 0.5 % SOLN 1 drop 3 (three) times daily as needed.     No current facility-administered medications for this visit.    Allergies  Allergen Reactions   Prednisone     Review of Systems  Constitutional:  Negative for activity change, chills, fever and unexpected weight change.  Respiratory:  Positive for cough and shortness of breath (no recent change).   Cardiovascular:  Negative for chest pain and palpitations.  Genitourinary:  Positive for frequency.  Neurological:  Positive for headaches.   BP (!) 143/77 (BP Location: Left Arm, Patient Position: Sitting)   Pulse 75   Resp 20   Ht '5\' 11"'$  (1.803 m)   Wt 175 lb (79.4 kg)   SpO2 97% Comment: RA  BMI 24.41 kg/m  Physical Exam Vitals reviewed.  Constitutional:      General: He is not in acute distress.    Appearance: Normal appearance.  HENT:     Head: Normocephalic and atraumatic.  Eyes:     General: No scleral icterus.    Extraocular Movements: Extraocular movements intact.  Neck:     Vascular: No carotid bruit.  Cardiovascular:     Rate and Rhythm: Normal rate and regular rhythm.     Heart sounds: Murmur (Faint systolic murmur) heard.  Pulmonary:     Effort: Pulmonary effort is normal. No respiratory distress.     Breath sounds: Normal breath sounds. No wheezing.  Abdominal:     General: There is no distension.     Palpations: Abdomen is soft.  Musculoskeletal:     Cervical back: Neck supple.  Lymphadenopathy:     Cervical: No cervical adenopathy.  Skin:    General: Skin is warm and dry.  Neurological:     General: No focal deficit present.     Mental Status: He is alert and oriented to person, place, and time.     Cranial Nerves: No cranial nerve deficit.     Motor: No weakness.     Diagnostic  Tests: NUCLEAR MEDICINE PET SKULL BASE TO THIGH   TECHNIQUE: 8.8 mCi F-18 FDG was injected intravenously. Full-ring PET imaging was performed from the skull base to thigh after the radiotracer. CT data was obtained and used for attenuation  correction and anatomic localization.   Fasting blood glucose: 97 mg/dl   COMPARISON:  CT chest 04/05/2015.   FINDINGS: Mediastinal blood pool activity: SUV max 2.0   Liver activity: SUV max NA   NECK:   No abnormal hypermetabolism.   Incidental CT findings:   None.   CHEST:   Calcified mediastinal and hilar lymph nodes show mild hypermetabolism. Index AP window lymph node measures 11 mm with SUV max 4.2. No hypermetabolic axillary lymph nodes. Nodular subpleural consolidation/thickening in the lateral right lower lobe (8/49) is mildly hypermetabolic, SUV max 5.0. CT appearance is minimally more prominent than on 04/05/2015. Nodular component measures approximately 8 mm but there is respiratory motion. Similarly, there is focal pleural thickening along the medial lower right hemithorax (8/56) with minimal hypermetabolism, SUV max 2.7. Again, appearance is minimally more prominent than on 04/05/2015. No additional areas of abnormal hypermetabolism.   Incidental CT findings:   Atherosclerotic calcification of the aorta, aortic valve and coronary arteries. Heart is enlarged. No pericardial or pleural effusion. Right upper lobectomy with additional postoperative changes in the right hemithorax.   ABDOMEN/PELVIS:   No abnormal hypermetabolism in the liver, adrenal glands, spleen or pancreas. No hypermetabolic lymph nodes.   Incidental CT findings:   Liver, gallbladder and adrenal glands are unremarkable. Low-attenuation lesions in both kidneys, measuring up to 4.5 cm on the left, likely cysts. Spleen, pancreas, stomach and bowel are unremarkable. Prostate is mildly enlarged. Bilateral inguinal hernias contain fat.    SKELETON:   No abnormal hypermetabolism.   Incidental CT findings:   Degenerative changes in the spine. Thoracotomy changes on the right.   IMPRESSION: 1. Slightly progressive areas of subpleural thickening in the right lower lobe with mild associated hypermetabolism. Question prior pleurodesis and/or postoperative scarring. Malignancy is not favored. Consider follow-up CT chest in 3-6 months, as clinically indicated. 2. Calcified mediastinal and hilar lymph nodes, unchanged from 04/05/2015. Associated mild hypermetabolism is presumably inflammatory in etiology. 3. Enlarged prostate. 4. Aortic atherosclerosis (ICD10-I70.0). Coronary artery calcification.     Electronically Signed   By: Lorin Picket M.D.   On: 01/27/2021 15:21 I personally reviewed the PET/CT images.  There is some subpleural thickening around the right lower lobe with very mild hypermetabolism.  Calcified mediastinal and hilar lymph nodes with mild hypermetabolism as well.  Unchanged in size from CT in 2016.  Impression: [name] [name] is an 87 year old man with a remote history of tobacco abuse, prior right lung surgery for benign tumor, nonischemic cardiomyopathy, right bundle branch block, mitral valve prolapse, tricuspid regurgitation, mildly dilated aortic root, hyperlipidemia, and BPH.  [name] [name] has some opacity on his scans near the right costophrenic angle.  Looking back at his CT from 2016 there has been minimal if any progression in this area.  There is only very mild uptake on PET.  I highly doubt this is malignant, but obviously cannot rule that out completely.  Given he has had other waxing and waning lung nodules, this is most likely infectious or inflammatory in nature.  I do not think there is anything suspicious enough to warrant a biopsy at this point.    He has hilar and mediastinal adenopathy.  There is some activity in those nodes.  The nodes are calcified suggesting a possible infectious  source.  The nodes are unchanged in size going back to 2016.  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  Plan: Return with CT chest in 3 months.  I spent over 45 minutes in review of records, images, and in consultation with [name] [name] today. Melrose Nakayama, MD [facility] [phone]

## 2021-02-15 ENCOUNTER — Telehealth: Payer: Self-pay | Admitting: *Deleted

## 2021-02-15 NOTE — Chronic Care Management (AMB) (Signed)
  Chronic Care [name]   Note  02/15/2021 Name: [name] [name] MRN: [id number] DOB: 15-Feb-1931  [name] [name] is a 87 y.o. year old male who is a primary care patient of Birdie Riddle, Aundra Millet, MD. I reached out to [name] [name] by phone today in response to a referral sent by [name]'s PCP, Midge Minium, MD.      [name] [name] was given information about Chronic Care [name] services today including:  CCM service includes personalized support from designated clinical staff supervised by his physician, including individualized plan of care and coordination with other care providers 24/7 contact phone numbers for assistance for urgent and routine care needs. Service will only be billed when office clinical staff spend 20 minutes or more in a month to coordinate care. Only one practitioner may furnish and bill the service in a calendar month. The patient may stop CCM services at any time (effective at the end of the month) by phone call to the office staff. The patient will be responsible for cost sharing (co-pay) of up to 20% of the service fee (after annual deductible is met).  Patient did not agree to enrollment in care [name] services and does not wish to consider at this time.  Follow up plan: Patient declines engagement by the care [name] team. Appropriate care team members and provider have been notified via electronic communication.  The care [name] team is available to follow up with the patient after provider conversation with the patient regarding recommendation for care [name] engagement and subsequent re-referral to the care [name] team.   [name] [name]  Direct Dial: [phone]

## 2021-03-17 ENCOUNTER — Other Ambulatory Visit: Payer: Self-pay | Admitting: Family Medicine

## 2021-03-17 DIAGNOSIS — E785 Hyperlipidemia, unspecified: Secondary | ICD-10-CM

## 2021-03-25 ENCOUNTER — Other Ambulatory Visit: Payer: Self-pay | Admitting: Family Medicine

## 2021-03-25 DIAGNOSIS — E785 Hyperlipidemia, unspecified: Secondary | ICD-10-CM

## 2021-04-12 DIAGNOSIS — H6123 Impacted cerumen, bilateral: Secondary | ICD-10-CM | POA: Diagnosis not present

## 2021-04-13 ENCOUNTER — Ambulatory Visit (INDEPENDENT_AMBULATORY_CARE_PROVIDER_SITE_OTHER): Payer: No Typology Code available for payment source | Admitting: Registered Nurse

## 2021-04-13 ENCOUNTER — Other Ambulatory Visit: Payer: Self-pay

## 2021-04-13 VITALS — BP 127/67 | HR 69 | Temp 98.1°F | Resp 18 | Ht 71.0 in | Wt 176.1 lb

## 2021-04-13 DIAGNOSIS — H60503 Unspecified acute noninfective otitis externa, bilateral: Secondary | ICD-10-CM | POA: Diagnosis not present

## 2021-04-13 DIAGNOSIS — H6122 Impacted cerumen, left ear: Secondary | ICD-10-CM | POA: Diagnosis not present

## 2021-04-13 MED ORDER — ACETIC ACID 2 % OT SOLN: 4.0000 [drp] | Freq: Two times a day (BID) | OTIC | 0 refills | Status: DC | PRN

## 2021-04-13 NOTE — Patient Instructions (Signed)
[name] to meet you  Glad we solved the ear problem  Use the Vosol if you need for any ear irritation.  Call if you need anything  Thank you  Rich

## 2021-04-13 NOTE — Progress Notes (Signed)
Established Patient Office Visit  Subjective:  Patient ID: [name] [name], male    DOB: [birth year]  Age: 87 y.o. MRN: [id number]  CC:  Chief Complaint  Patient presents with   Cerumen Impaction    Patient states he went to get his ears cleaned yesterday and now having more problems hearing in the left ear.    HPI [name] [name] presents for L impacted cerumen  Seen at [facility], given debrox and disimpacted Feels L side was not effective Muffled hearing No drainage or pain  Otherwise no symptoms.  Past Medical History:  Diagnosis Date   Benign tumor of lung    BPH (benign prostatic hyperplasia)    Cardiomyopathy ([place])    Cataract    Dilated aortic root (HCC)    Hx of mild   Hyperlipidemia    Mitral valve prolapse    irregular heart beat   Palpitations    Pure hypercholesterolemia    RBBB (right bundle branch block)     Past Surgical History:  Procedure Laterality Date   BLEPHAROPLASTY Bilateral    CATARACT EXTRACTION, BILATERAL     COSMETIC SURGERY     Excision on neck     Excision of 2 cm soft tissue mass of left neck   INGUINAL HERNIA REPAIR Bilateral    Resection of Lung Right    Hx of, right lung secondary to benign tumor   SHOULDER ARTHROSCOPY Left    testicular hernia     VASECTOMY      Family History  Problem Relation Age of Onset   Aortic aneurysm Father    Heart disease Father    Hypertension Father    Hyperlipidemia Father    Lung disease Sister    Breast cancer Sister    Pancreatic cancer Mother    Hyperlipidemia Other    Colon cancer Sister    Breast cancer Sister    Colon polyps Sister    Esophageal cancer Neg Hx    Stomach cancer Neg Hx    Rectal cancer Neg Hx     Social History   Socioeconomic History   Marital status: Married    Spouse name: Not on file   Number of children: 1   Years of education: Not on file   Highest education level: Not on file  Occupational History   Occupation: Farmer-retired  Tobacco Use   Smoking  status: Former    Types: Cigarettes    Quit date: 1982    Years since quitting: 40.8   Smokeless tobacco: Never   Tobacco comments:    quit 80's  Vaping Use   Vaping Use: Never used  Substance and Sexual Activity   Alcohol use: No    Alcohol/week: 0.0 standard drinks   Drug use: No   Sexual activity: Never  Other Topics Concern   Not on file  Social History Narrative   Exercise 20-30 min walks. Patient is married. Education: [employer].    Social Determinants of Health   Financial Resource Strain: Low Risk    Difficulty of Paying Living Expenses: Not hard at all  Food Insecurity: No Food Insecurity   Worried About Charity fundraiser in the Last Year: Never true   [place] in the Last Year: Never true  Transportation Needs: No Transportation Needs   Lack of Transportation (Medical): No   Lack of Transportation (Non-Medical): No  Physical Activity: Insufficiently Active   Days of Exercise per Week: 7 days  Minutes of Exercise per Session: 20 min  Stress: No Stress Concern Present   Feeling of Stress : Not at all  Social Connections: Moderately Integrated   Frequency of Communication with Friends and Family: More than three times a week   Frequency of Social Gatherings with Friends and Family: More than three times a week   Attends Religious Services: More than 4 times per year   Active Member of [employer] or Organizations: No   Attends Archivist Meetings: Never   Marital Status: Married  Human resources officer Violence: Not At Risk   Fear of Current or Ex-Partner: No   Emotionally Abused: No   Physically Abused: No   Sexually Abused: No    Outpatient Medications Prior to Visit  Medication Sig Dispense Refill   aspirin 81 MG EC tablet Take 81 mg by mouth daily.     atorvastatin (LIPITOR) 40 MG tablet TAKE 1 TABLET BY MOUTH DAILY AT 6 PM. 90 tablet 0   Calcium Carbonate-Vitamin D 600-400 MG-UNIT tablet Take 1 tablet by mouth 2 (two) times daily.     co-enzyme  Q-10 30 MG capsule Take 30 mg by mouth 2 (two) times daily.     fish oil-omega-3 fatty acids 1000 MG capsule Take 1 g by mouth daily. Nordic naturals omega 3 TID     fluticasone (FLONASE) 50 MCG/ACT nasal spray USE 2 SPRAYS IN EACH NOSTRIL DAILY. 48 g 1   Multiple Vitamins-Minerals (VISION-VITE PRESERVE PO) Take by mouth. 2 per day     TURMERIC PO Take 2,000 mg by mouth daily.     Wheat Dextrin (EQ FIBER POWDER PO) Take by mouth.     carboxymethylcellulose (REFRESH PLUS) 0.5 % SOLN 1 drop 3 (three) times daily as needed.     No facility-administered medications prior to visit.    Allergies  Allergen Reactions   Prednisone     ROS Review of Systems  Constitutional: Negative.   HENT: Negative.    Eyes: Negative.   Respiratory: Negative.    Cardiovascular: Negative.   Gastrointestinal: Negative.   Genitourinary: Negative.   Musculoskeletal: Negative.   Skin: Negative.   Neurological: Negative.   Psychiatric/Behavioral: Negative.    All other systems reviewed and are negative.    Objective:    Physical Exam HENT:     Right Ear: Tympanic membrane, ear canal and external ear normal. There is no impacted cerumen.     Left Ear: Tympanic membrane, ear canal and external ear normal. There is impacted cerumen.    BP 127/67   Pulse 69   Temp 98.1 F (36.7 C) (Temporal)   Resp 18   Ht 5\' 11"  (1.803 m)   Wt 176 lb 1.6 oz (79.9 kg)   SpO2 99%   BMI 24.56 kg/m  Wt Readings from Last 3 Encounters:  04/13/21 176 lb 1.6 oz (79.9 kg)  02/08/21 175 lb (79.4 kg)  09/27/20 178 lb (80.7 kg)     Health Maintenance Due  Topic Date Due   Zoster Vaccines- Shingrix (1 of 2) Never done    There are no preventive care reminders to display for this patient.  Lab Results  Component Value Date   TSH 1.96 12/11/2018   Lab Results  Component Value Date   WBC 4.7 12/11/2018   HGB 14.5 12/11/2018   HCT 43.2 12/11/2018   MCV 92.8 12/11/2018   PLT 170.0 12/11/2018   Lab Results   Component Value Date   NA 140 12/11/2018  K 4.3 12/11/2018   CO2 30 12/11/2018   GLUCOSE 120 (H) 12/11/2018   BUN 23 12/11/2018   CREATININE 0.92 12/11/2018   BILITOT 0.8 12/11/2018   ALKPHOS 73 12/11/2018   AST 20 12/11/2018   ALT 14 12/11/2018   PROT 6.3 12/11/2018   ALBUMIN 4.1 12/11/2018   CALCIUM 9.0 12/11/2018   GFR 78.00 12/11/2018   Lab Results  Component Value Date   CHOL 116 12/11/2018   Lab Results  Component Value Date   HDL 39.80 12/11/2018   Lab Results  Component Value Date   LDLCALC 53 12/11/2018   Lab Results  Component Value Date   TRIG 113.0 12/11/2018   Lab Results  Component Value Date   CHOLHDL 3 12/11/2018   No results found for: HGBA1C    Assessment & Plan:   Problem List Items Addressed This Visit   None Visit Diagnoses     Acute otitis externa of both ears, unspecified type    -  Primary   Relevant Medications   acetic acid 2 % otic solution   Impacted cerumen, left ear           Meds ordered this encounter  Medications   acetic acid 2 % otic solution    Sig: Place 4 drops into both ears 2 (two) times daily as needed.    Dispense:  15 mL    Refill:  0    Order Specific Question:   Supervising Provider    Answer:   Carlota Raspberry, JEFFREY R [[id number]]    Follow-up: Return if symptoms worsen or fail to improve.   PLAN Lavage by Jerene Pitch, CMA, successful. Question of some otitis, will give vosol for relief. Return if symptoms arise. Patient encouraged to call clinic with any questions, comments, or concerns.  Maximiano Coss, NP

## 2021-04-14 ENCOUNTER — Ambulatory Visit: Payer: No Typology Code available for payment source | Admitting: Registered Nurse

## 2021-04-18 ENCOUNTER — Other Ambulatory Visit: Payer: Self-pay | Admitting: *Deleted

## 2021-04-18 DIAGNOSIS — R918 Other nonspecific abnormal finding of lung field: Secondary | ICD-10-CM

## 2021-04-26 NOTE — Progress Notes (Signed)
HPI: FU palpitations, cardiomyopathy and family history of abdominal aortic aneurysm. Carotid dopplers 7/09 showed normal carotid arteries bilaterally. Myoview in September of 2013. Ejection fraction was 53% and the perfusion was normal. Abdominal ultrasound 7/18 showed proximal abdominal aorta measuring 2.5 x 2.5 cm.. Last echocardiogram November 2020 showed normal LV function, mild left ventricular hypertrophy, grade 1 diastolic dysfunction, moderate tricuspid regurgitation.  Since I last saw him he denies dyspnea, chest pain, palpitations or syncope.  Current Outpatient Medications  Medication Sig Dispense Refill   acetic acid 2 % otic solution Place 4 drops into both ears 2 (two) times daily as needed. 15 mL 0   aspirin 81 MG EC tablet Take 81 mg by mouth daily.     atorvastatin (LIPITOR) 40 MG tablet TAKE 1 TABLET BY MOUTH DAILY AT 6 PM. 90 tablet 0   Calcium Carbonate-Vitamin D 600-400 MG-UNIT tablet Take 1 tablet by mouth 2 (two) times daily.     co-enzyme Q-10 30 MG capsule Take 30 mg by mouth 2 (two) times daily.     fish oil-omega-3 fatty acids 1000 MG capsule Take 1 g by mouth daily. Nordic naturals omega 3 TID     fluticasone (FLONASE) 50 MCG/ACT nasal spray USE 2 SPRAYS IN EACH NOSTRIL DAILY. 48 g 1   Multiple Vitamins-Minerals (VISION-VITE PRESERVE PO) Take by mouth. 2 per day     TURMERIC PO Take 2,000 mg by mouth daily.     Wheat Dextrin (EQ FIBER POWDER PO) Take by mouth.     No current facility-administered medications for this visit.     Past Medical History:  Diagnosis Date   Benign tumor of lung    BPH (benign prostatic hyperplasia)    Cardiomyopathy ([place])    Cataract    Dilated aortic root (H[facility])    Hx of mild   Hyperlipidemia    Mitral valve prolapse    irregular heart beat   Palpitations    Pure hypercholesterolemia    RBBB (right bundle branch block)     Past Surgical History:  Procedure Laterality Date   BLEPHAROPLASTY Bilateral    CATARACT  EXTRACTION, BILATERAL     COSMETIC SURGERY     Excision on neck     Excision of 2 cm soft tissue mass of left neck   INGUINAL HERNIA REPAIR Bilateral    Resection of Lung Right    Hx of, right lung secondary to benign tumor   SHOULDER ARTHROSCOPY Left    testicular hernia     VASECTOMY      Social History   Socioeconomic History   Marital status: Married    Spouse name: Not on file   Number of children: 1   Years of education: Not on file   Highest education level: Not on file  Occupational History   Occupation: Farmer-retired  Tobacco Use   Smoking status: Former    Types: Cigarettes    Quit date: 1982    Years since quitting: 40.9   Smokeless tobacco: Never   Tobacco comments:    quit 80's  Vaping Use   Vaping Use: Never used  Substance and Sexual Activity   Alcohol use: No    Alcohol/week: 0.0 standard drinks   Drug use: No   Sexual activity: Never  Other Topics Concern   Not on file  Social History Narrative   Exercise 20-30 min walks. Patient is married. Education: [employer].    Social Determinants of Health  Financial Resource Strain: Low Risk    Difficulty of Paying Living Expenses: Not hard at all  Food Insecurity: No Food Insecurity   Worried About Charity fundraiser in the Last Year: Never true   Ran Out of Food in the Last Year: Never true  Transportation Needs: No Transportation Needs   Lack of Transportation (Medical): No   Lack of Transportation (Non-Medical): No  Physical Activity: Insufficiently Active   Days of Exercise per Week: 7 days   Minutes of Exercise per Session: 20 min  Stress: No Stress Concern Present   Feeling of Stress : Not at all  Social Connections: Moderately Integrated   Frequency of Communication with Friends and Family: More than three times a week   Frequency of Social Gatherings with Friends and Family: More than three times a week   Attends Religious Services: More than 4 times per year   Active Member of [employer] or  Organizations: No   Attends Music therapist: Never   Marital Status: Married  Human resources officer Violence: Not At Risk   Fear of Current or Ex-Partner: No   Emotionally Abused: No   Physically Abused: No   Sexually Abused: No    Family History  Problem Relation Age of Onset   Aortic aneurysm Father    Heart disease Father    Hypertension Father    Hyperlipidemia Father    Lung disease Sister    Breast cancer Sister    Pancreatic cancer Mother    Hyperlipidemia Other    Colon cancer Sister    Breast cancer Sister    Colon polyps Sister    Esophageal cancer Neg Hx    Stomach cancer Neg Hx    Rectal cancer Neg Hx     ROS: no fevers or chills, productive cough, hemoptysis, dysphasia, odynophagia, melena, hematochezia, dysuria, hematuria, rash, seizure activity, orthopnea, PND, pedal edema, claudication. Remaining systems are negative.  Physical Exam: Well-developed well-nourished in no acute distress.  Skin is warm and dry.  HEENT is normal.  Neck is supple.  Chest is clear to auscultation with normal expansion.  Cardiovascular exam is regular rate and rhythm.  Abdominal exam nontender or distended. No masses palpated. Extremities show no edema. neuro grossly intact  ECG-sinus bradycardia at a rate of 51, right bundle branch block.  First-degree AV block.  Personally reviewed  [name]  1 Cardiomyopathy-LV function normalized on most recent echo.  2 dilated aortic root-not evident on most recent echocardiogram.  3 palpitations-symptoms are controlled.  Beta-blocker discontinued previously due to bradycardia.  4 hyperlipidemia-continue statin.  5 history of moderate tricuspid regurgitation-we will be conservative based on patient's age.  Kirk Ruths, MD

## 2021-05-05 ENCOUNTER — Ambulatory Visit (INDEPENDENT_AMBULATORY_CARE_PROVIDER_SITE_OTHER): Payer: No Typology Code available for payment source | Admitting: Cardiology

## 2021-05-05 ENCOUNTER — Other Ambulatory Visit: Payer: Self-pay

## 2021-05-05 ENCOUNTER — Encounter: Payer: Self-pay | Admitting: Cardiology

## 2021-05-05 VITALS — BP 130/74 | HR 51 | Ht 72.0 in | Wt 178.0 lb

## 2021-05-05 DIAGNOSIS — R002 Palpitations: Secondary | ICD-10-CM

## 2021-05-05 DIAGNOSIS — E7800 Pure hypercholesterolemia, unspecified: Secondary | ICD-10-CM

## 2021-05-05 DIAGNOSIS — I420 Dilated cardiomyopathy: Secondary | ICD-10-CM | POA: Diagnosis not present

## 2021-05-05 DIAGNOSIS — I77810 Thoracic aortic ectasia: Secondary | ICD-10-CM | POA: Diagnosis not present

## 2021-05-05 NOTE — Patient Instructions (Signed)
  Follow-Up: At [facility], you and your health needs are our priority.  As part of our continuing mission to provide you with exceptional heart care, we have created designated Provider Care Teams.  These Care Teams include your primary Cardiologist (physician) and Advanced Practice Providers (APPs -  Physician Assistants and Nurse Practitioners) who all work together to provide you with the care you need, when you need it.  We recommend signing up for the patient portal called "MyChart".  Sign up information is provided on this After Visit Summary.  MyChart is used to connect with patients for Virtual Visits (Telemedicine).  Patients are able to view lab/test results, encounter notes, upcoming appointments, etc.  Non-urgent messages can be sent to your provider as well.   To learn more about what you can do with MyChart, go to [email or web address].    Your next appointment:   12 month(s)  The format for your next appointment:   In Person  Provider:   Kirk Ruths MD

## 2021-05-24 ENCOUNTER — Other Ambulatory Visit: Payer: No Typology Code available for payment source

## 2021-05-24 ENCOUNTER — Encounter: Payer: No Typology Code available for payment source | Admitting: Thoracic Surgery (Cardiothoracic Vascular Surgery)

## 2021-06-18 ENCOUNTER — Other Ambulatory Visit: Payer: Self-pay | Admitting: Family Medicine

## 2021-06-18 DIAGNOSIS — E785 Hyperlipidemia, unspecified: Secondary | ICD-10-CM

## 2021-09-15 ENCOUNTER — Other Ambulatory Visit: Payer: Self-pay | Admitting: Registered Nurse

## 2021-09-15 DIAGNOSIS — E785 Hyperlipidemia, unspecified: Secondary | ICD-10-CM

## 2021-10-03 ENCOUNTER — Ambulatory Visit: Payer: Medicare HMO

## 2021-10-06 ENCOUNTER — Ambulatory Visit (INDEPENDENT_AMBULATORY_CARE_PROVIDER_SITE_OTHER): Payer: Medicare HMO

## 2021-10-06 DIAGNOSIS — Z0000 Encounter for general adult medical examination without abnormal findings: Secondary | ICD-10-CM | POA: Diagnosis not present

## 2021-10-06 DIAGNOSIS — Z0100 Encounter for examination of eyes and vision without abnormal findings: Secondary | ICD-10-CM

## 2021-10-06 NOTE — Progress Notes (Signed)
? ?Subjective:  ? [name] [name] is a 88 y.o. male who presents for an Subsequent Medicare Annual Wellness Visit. ? ? ?I connected with [name] [name] today by telephone and verified that I am speaking with the correct person using two identifiers. ?Location patient: home ?Location provider: work ?Persons participating in the virtual visit: patient, provider. ?  ?I discussed the limitations, risks, security and privacy concerns of performing an evaluation and management service by telephone and the availability of in person appointments. I also discussed with the patient that there may be a patient responsible charge related to this service. The patient expressed understanding and verbally consented to this telephonic visit.  ?  ?Interactive audio and video telecommunications were attempted between this provider and patient, however failed, due to patient having technical difficulties OR patient did not have access to video capability.  We continued and completed visit with audio only. ? ?  ?Review of Systems    ? ?Cardiac Risk Factors include: advanced age (>37mn, >>41women);male gender;dyslipidemia ? ?   ?Objective:  ?  ?Today's Vitals  ? ?There is no height or weight on file to calculate BMI. ? ? ?  10/06/2021  ?  8:23 AM 09/27/2020  ?  8:11 AM 12/08/2019  ?  8:46 AM 03/06/2019  ?  9:20 AM 02/28/2018  ?  9:15 AM 09/20/2017  ?  9:00 AM 05/28/2017  ? 10:10 AM  ?Advanced Directives  ?Does Patient Have a Medical Advance Directive? Yes Yes Yes Yes Yes No Yes  ?Type of AParamedicof [place] will [place] will [place] will [place] will [place] will  Healthcare Power of Attorney  ?Does patient want to make changes to medical advance directive?   No - Patient declined      ?Copy of HDulcein Chart? No - copy requested Yes - validated most recent copy scanned in  chart (See row information)  Yes - validated most recent copy scanned in chart (See row information) Yes    ?Would patient like information on creating a medical advance directive?   No - Patient declined   No - Patient declined   ? ? ?Current Medications (verified) ?Outpatient Encounter Medications as of 10/06/2021  ?Medication Sig  ? atorvastatin (LIPITOR) 40 MG tablet TAKE 1 TABLET BY MOUTH DAILY AT 6 PM.  ? Calcium Carbonate-Vitamin D 600-400 MG-UNIT tablet Take 1 tablet by mouth 2 (two) times daily.  ? co-enzyme Q-10 30 MG capsule Take 30 mg by mouth 2 (two) times daily.  ? fish oil-omega-3 fatty acids 1000 MG capsule Take 1 g by mouth daily. Nordic naturals omega 3 TID  ? fluticasone (FLONASE) 50 MCG/ACT nasal spray USE 2 SPRAYS IN EACH NOSTRIL DAILY.  ? Multiple Vitamins-Minerals (VISION-VITE PRESERVE PO) Take by mouth. 2 per day  ? TURMERIC PO Take 2,000 mg by mouth daily.  ? Wheat Dextrin (EQ FIBER POWDER PO) Take by mouth.  ? acetic acid 2 % otic solution Place 4 drops into both ears 2 (two) times daily as needed. (Patient not taking: Reported on 10/06/2021)  ? aspirin 81 MG EC tablet Take 81 mg by mouth daily. (Patient not taking: Reported on 10/06/2021)  ? ?No facility-administered encounter medications on file as of 10/06/2021.  ? ? ?Allergies (verified) ?Prednisone  ? ?History: ?Past Medical History:  ?Diagnosis Date  ? Benign tumor of lung   ? BPH (benign prostatic hyperplasia)   ?  Cardiomyopathy ([place])   ? Cataract   ? Dilated aortic root ([place])   ? Hx of mild  ? Hyperlipidemia   ? Mitral valve prolapse   ? irregular heart beat  ? Palpitations   ? Pure hypercholesterolemia   ? RBBB (right bundle branch block)   ? ?Past Surgical History:  ?Procedure Laterality Date  ? BLEPHAROPLASTY Bilateral   ? CATARACT EXTRACTION, BILATERAL    ? COSMETIC SURGERY    ? Excision on neck    ? Excision of 2 cm soft tissue mass of left neck  ? INGUINAL HERNIA REPAIR Bilateral   ? Resection of Lung Right   ? Hx of, right lung  secondary to benign tumor  ? SHOULDER ARTHROSCOPY Left   ? testicular hernia    ? VASECTOMY    ? ?Family History  ?Problem Relation Age of Onset  ? Aortic aneurysm Father   ? Heart disease Father   ? Hypertension Father   ? Hyperlipidemia Father   ? Lung disease Sister   ? Breast cancer Sister   ? Pancreatic cancer Mother   ? Hyperlipidemia Other   ? Colon cancer Sister   ? Breast cancer Sister   ? Colon polyps Sister   ? Esophageal cancer Neg Hx   ? Stomach cancer Neg Hx   ? Rectal cancer Neg Hx   ? ?Social History  ? ?Socioeconomic History  ? Marital status: Married  ?  Spouse name: Not on file  ? Number of children: 1  ? Years of education: Not on file  ? Highest education level: Not on file  ?Occupational History  ? Occupation: Farmer-retired  ?Tobacco Use  ? Smoking status: Former  ?  Types: Cigarettes  ?  Quit date: 57  ?  Years since quitting: 41.3  ? Smokeless tobacco: Never  ? Tobacco comments:  ?  quit 80's  ?Vaping Use  ? Vaping Use: Never used  ?Substance and Sexual Activity  ? Alcohol use: No  ?  Alcohol/week: 0.0 standard drinks  ? Drug use: No  ? Sexual activity: Never  ?Other Topics Concern  ? Not on file  ?Social History Narrative  ? Exercise 20-30 min walks. Patient is married. Education: [employer].   ? ?Social Determinants of Health  ? ?Financial Resource Strain: Low Risk   ? Difficulty of Paying Living Expenses: Not hard at all  ?Food Insecurity: No Food Insecurity  ? Worried About Charity fundraiser in the Last Year: Never true  ? Ran Out of Food in the Last Year: Never true  ?Transportation Needs: No Transportation Needs  ? Lack of Transportation (Medical): No  ? Lack of Transportation (Non-Medical): No  ?Physical Activity: Insufficiently Active  ? Days of Exercise per Week: 5 days  ? Minutes of Exercise per Session: 20 min  ?Stress: No Stress Concern Present  ? Feeling of Stress : Not at all  ?Social Connections: Moderately Integrated  ? Frequency of Communication with Friends and Family:  Twice a week  ? Frequency of Social Gatherings with Friends and Family: Twice a week  ? Attends Religious Services: More than 4 times per year  ? Active Member of Clubs or Organizations: No  ? Attends Archivist Meetings: Never  ? Marital Status: Married  ? ? ?Tobacco Counseling ?Counseling given: Not Answered ?Tobacco comments: quit 80's ? ? ?Clinical Intake: ? ?Pre-visit preparation completed: Yes ? ?Pain : No/denies pain ? ?  ? ?Nutritional Risks: None ?Diabetes: No ? ?  How often do you need to have someone help you when you read instructions, pamphlets, or other written materials from your doctor or pharmacy?: 1 - Never ?What is the last grade level you completed in school?: college ? ?Diabetic?no  ? ?Interpreter Needed?: No ? ?Information entered by :: [email or web address] ? ? ?Activities of Daily Living ? ?  10/06/2021  ?  8:27 AM  ?In your present state of health, do you have any difficulty performing the following activities:  ?Hearing? 0  ?Vision? 0  ?Difficulty concentrating or making decisions? 0  ?Walking or climbing stairs? 0  ?Dressing or bathing? 0  ?Doing errands, shopping? 0  ?Preparing Food and eating ? N  ?Using the Toilet? N  ?In the past six months, have you accidently leaked urine? N  ?Do you have problems with loss of bowel control? N  ?Managing your Medications? N  ?Managing your Finances? N  ?Housekeeping or managing your Housekeeping? N  ? ? ?Patient Care Team: ?Midge Minium, MD as PCP - General (Family Medicine) ?Lelon Perla, MD (Cardiology) ?Kathie Rhodes, MD (Inactive) (Urology) ?Tanda Rockers, MD as Consulting Physician (Pulmonary Disease) ?Allyn Kenner, MD (Dermatology) ?Doran Stabler, MD as Consulting Physician (Gastroenterology) ? ?Indicate any recent Medical Services you may have received from other than [facility] providers in the past year (date may be approximate). ? ?   ?Assessment:  ? This is a routine wellness examination for [name] [name]. ? ?Hearing/Vision screen ?Vision  Screening - Comments:: Referral 10/06/2021 ? ?Dietary issues and exercise activities discussed: ?Current Exercise Habits: Home exercise routine, Type of exercise: walking, Time (Minutes): 30, Frequency (Times/Wee

## 2021-10-06 NOTE — Patient Instructions (Signed)
[name] , ?Thank you for taking time to come for your Medicare Wellness Visit. I appreciate your ongoing commitment to your health goals. Please review the following plan we discussed and let me know if I can assist you in the future.  ? ?Screening recommendations/referrals: ?Colonoscopy: no longer required  ?Recommended yearly ophthalmology/optometry visit for glaucoma screening and checkup ?Recommended yearly dental visit for hygiene and checkup ? ?Vaccinations: ?Influenza vaccine: completed  ?Pneumococcal vaccine: completed  ?Tdap vaccine: 06/05/2016 ?Shingles vaccine: Per patient [facility]    ? ?Advanced directives: yes  ? ?Conditions/risks identified: none  ? ?Next appointment: none  ? ?Preventive Care 35 Years and Older, Male ?Preventive care refers to lifestyle choices and visits with your health care provider that can promote health and wellness. ?What does preventive care include? ?A yearly physical exam. This is also called an annual well check. ?Dental exams once or twice a year. ?Routine eye exams. Ask your health care provider how often you should have your eyes checked. ?Personal lifestyle choices, including: ?Daily care of your teeth and gums. ?Regular physical activity. ?Eating a healthy diet. ?Avoiding tobacco and drug use. ?Limiting alcohol use. ?Practicing safe sex. ?Taking low doses of aspirin every day. ?Taking vitamin and mineral supplements as recommended by your health care provider. ?What happens during an annual well check? ?The services and screenings done by your health care provider during your annual well check will depend on your age, overall health, lifestyle risk factors, and family history of disease. ?Counseling  ?Your health care provider may ask you questions about your: ?Alcohol use. ?Tobacco use. ?Drug use. ?Emotional well-being. ?Home and relationship well-being. ?Sexual activity. ?Eating habits. ?History of falls. ?Memory and ability to understand (cognition). ?Work and work  Statistician. ?Screening  ?You may have the following tests or measurements: ?Height, weight, and BMI. ?Blood pressure. ?Lipid and cholesterol levels. These may be checked every 5 years, or more frequently if you are over 60 years old. ?Skin check. ?Lung cancer screening. You may have this screening every year starting at age 61 if you have a 30-pack-year history of smoking and currently smoke or have quit within the past 15 years. ?Fecal occult blood test (FOBT) of the stool. You may have this test every year starting at age 23. ?Flexible sigmoidoscopy or colonoscopy. You may have a sigmoidoscopy every 5 years or a colonoscopy every 10 years starting at age 57. ?Prostate cancer screening. Recommendations will vary depending on your family history and other risks. ?Hepatitis C blood test. ?Hepatitis B blood test. ?Sexually transmitted disease (STD) testing. ?Diabetes screening. This is done by checking your blood sugar (glucose) after you have not eaten for a while (fasting). You may have this done every 1-3 years. ?Abdominal aortic aneurysm (AAA) screening. You may need this if you are a current or former smoker. ?Osteoporosis. You may be screened starting at age 21 if you are at high risk. ?Talk with your health care provider about your test results, treatment options, and if necessary, the need for more tests. ?Vaccines  ?Your health care provider may recommend certain vaccines, such as: ?Influenza vaccine. This is recommended every year. ?Tetanus, diphtheria, and acellular pertussis (Tdap, Td) vaccine. You may need a Td booster every 10 years. ?Zoster vaccine. You may need this after age 62. ?Pneumococcal 13-valent conjugate (PCV13) vaccine. One dose is recommended after age 79. ?Pneumococcal polysaccharide (PPSV23) vaccine. One dose is recommended after age 83. ?Talk to your health care provider about which screenings and vaccines you need  and how often you need them. ?This information is not intended to replace  advice given to you by your health care provider. Make sure you discuss any questions you have with your health care provider. ?Document Released: 06/18/2015 Document Revised: 02/09/2016 Document Reviewed: 03/23/2015 ?Elsevier Interactive Patient Education ? 2017 [employer]. ? ?Fall Prevention in the Home ?Falls can cause injuries. They can happen to people of all ages. There are many things you can do to make your home safe and to help prevent falls. ?What can I do on the outside of my home? ?Regularly fix the edges of walkways and driveways and fix any cracks. ?Remove anything that might make you trip as you walk through a door, such as a raised step or threshold. ?Trim any bushes or trees on the path to your home. ?Use bright outdoor lighting. ?Clear any walking paths of anything that might make someone trip, such as rocks or tools. ?Regularly check to see if handrails are loose or broken. Make sure that both sides of any steps have handrails. ?Any raised decks and porches should have guardrails on the edges. ?Have any leaves, snow, or ice cleared regularly. ?Use sand or salt on walking paths during winter. ?Clean up any spills in your garage right away. This includes oil or grease spills. ?What can I do in the bathroom? ?Use night lights. ?Install grab bars by the toilet and in the tub and shower. Do not use towel bars as grab bars. ?Use non-skid mats or decals in the tub or shower. ?If you need to sit down in the shower, use a plastic, non-slip stool. ?Keep the floor dry. Clean up any water that spills on the floor as soon as it happens. ?Remove soap buildup in the tub or shower regularly. ?Attach bath mats securely with double-sided non-slip rug tape. ?Do not have throw rugs and other things on the floor that can make you trip. ?What can I do in the bedroom? ?Use night lights. ?Make sure that you have a light by your bed that is easy to reach. ?Do not use any sheets or blankets that are too big for your bed.  They should not hang down onto the floor. ?Have a firm chair that has side arms. You can use this for support while you get dressed. ?Do not have throw rugs and other things on the floor that can make you trip. ?What can I do in the kitchen? ?Clean up any spills right away. ?Avoid walking on wet floors. ?Keep items that you use a lot in easy-to-reach places. ?If you need to reach something above you, use a strong step stool that has a grab bar. ?Keep electrical cords out of the way. ?Do not use floor polish or wax that makes floors slippery. If you must use wax, use non-skid floor wax. ?Do not have throw rugs and other things on the floor that can make you trip. ?What can I do with my stairs? ?Do not leave any items on the stairs. ?Make sure that there are handrails on both sides of the stairs and use them. Fix handrails that are broken or loose. Make sure that handrails are as long as the stairways. ?Check any carpeting to make sure that it is firmly attached to the stairs. Fix any carpet that is loose or worn. ?Avoid having throw rugs at the top or bottom of the stairs. If you do have throw rugs, attach them to the floor with carpet tape. ?Make sure that you  have a light switch at the top of the stairs and the bottom of the stairs. If you do not have them, ask someone to add them for you. ?What else can I do to help prevent falls? ?Wear shoes that: ?Do not have high heels. ?Have rubber bottoms. ?Are comfortable and fit you well. ?Are closed at the toe. Do not wear sandals. ?If you use a stepladder: ?Make sure that it is fully opened. Do not climb a closed stepladder. ?Make sure that both sides of the stepladder are locked into place. ?Ask someone to hold it for you, if possible. ?Clearly mark and make sure that you can see: ?Any grab bars or handrails. ?First and last steps. ?Where the edge of each step is. ?Use tools that help you move around (mobility aids) if they are needed. These  include: ?Canes. ?Walkers. ?Scooters. ?Crutches. ?Turn on the lights when you go into a dark area. Replace any light bulbs as soon as they burn out. ?Set up your furniture so you have a clear path. Avoid moving your furniture aroun

## 2021-10-10 ENCOUNTER — Encounter: Payer: Self-pay | Admitting: Family Medicine

## 2021-10-10 ENCOUNTER — Ambulatory Visit (INDEPENDENT_AMBULATORY_CARE_PROVIDER_SITE_OTHER): Payer: Medicare HMO | Admitting: Family Medicine

## 2021-10-10 VITALS — BP 124/78 | HR 91 | Temp 97.2°F | Resp 16 | Ht 73.0 in | Wt 177.6 lb

## 2021-10-10 DIAGNOSIS — M7041 Prepatellar bursitis, right knee: Secondary | ICD-10-CM

## 2021-10-10 DIAGNOSIS — J321 Chronic frontal sinusitis: Secondary | ICD-10-CM

## 2021-10-10 DIAGNOSIS — S41111A Laceration without foreign body of right upper arm, initial encounter: Secondary | ICD-10-CM | POA: Diagnosis not present

## 2021-10-10 MED ORDER — CETIRIZINE HCL 10 MG PO TABS: 10.0000 mg | ORAL_TABLET | Freq: Every day | ORAL | 11 refills | Status: DC

## 2021-10-10 NOTE — Progress Notes (Signed)
? ?Subjective:  ? ? Patient ID: [name] [name], male    DOB: [birth year], 88 y.o.   MRN: [id number] ? ?HPI ?Daily HA- pt reports this has been going on 'a good while'.  Pt had MRI done last summer.  CT the year before that.  Nothing to account for daily HAs.  Improvement w/ tylenol.  HAs are frontal.  No facial TTP.  Pt uses daily nasal spray.  No visual changes w/ headaches.  No dizziness or nausea.  No sensitivity to light or sound ? ?R prepatellar bursitis- pt has hx of similar.  Pt reports he was 'barely able to walk' yesterday.  He iced it yesterday and he feels it is somewhat better today. ? ?R upper arm skin tear- pt bumped into a shelf yesterday and has multiple skin tears of upper arm.  Pt cleaned and applied betadine.  Pt currently on abx for tooth infxn- Amox '875mg'$  BID ? ? ?Review of Systems ?For ROS see HPI  ?   ?Objective:  ? Physical Exam ?Vitals reviewed.  ?Constitutional:   ?   General: He is not in acute distress. ?   Appearance: He is well-developed. He is not ill-appearing.  ?HENT:  ?   Head: Normocephalic and atraumatic.  ?   Ears:  ?   Comments: TMs retracted bilaterally ?   Nose:  ?   Comments: No TTP over sinuses ?Eyes:  ?   General: No scleral icterus. ?   Extraocular Movements: Extraocular movements intact.  ?   Pupils: Pupils are equal, round, and reactive to light.  ?Cardiovascular:  ?   Rate and Rhythm: Normal rate and regular rhythm.  ?Pulmonary:  ?   Effort: Pulmonary effort is normal. No respiratory distress.  ?   Breath sounds: Normal breath sounds. No wheezing.  ?Musculoskeletal:     ?   General: Swelling (large buoyant swelling of R prepatellar bursa.  no redness or TTP) present.  ?   Cervical back: Normal range of motion and neck supple.  ?Skin: ?   General: Skin is warm and dry.  ?   Comments: Pt w/ 3 skin tears of R upper arm that are not actively bleeding or oozing.  Tears are clean, dry, and w/o evidence of infxn  ?Neurological:  ?   Mental Status: He is alert and oriented to  person, place, and time.  ?   Gait: Gait normal.  ?Psychiatric:     ?   Mood and Affect: Mood normal.     ?   Behavior: Behavior normal.     ?   Thought Content: Thought content normal.  ? ? ? ? ? ?   ?Assessment & Plan:  ? ?Chronic frontal sinusitis- new.  Pt reports he has had daily HAs for 'quite some time'.  Has had CT and MRI of brain w/o acute findings.  The location of pt's HAs, the lack of associated sxs, and bilateral TM retraction consistent w/ sinus inflammation.  Pt is not able to take Prednisone due to possible arrhythmia.  Will start daily antihistamine.  Pt expressed understanding and is in agreement w/ plan.  ? ?Skin tears R upper arm- new.  Pt bumped into a shelf yesterday and has 3 linear skin tears on R upper arm.  They are clean, dry, and show no signs of infxn.  Pt is UTD on Tdap.  Basic wound care advice provided. ? ?R prepatellar bursitis- new.  Pt states he has had similar in  the past.  Yesterday had pain and difficulty walking but after icing it yesterday, he feels it is improving.  No redness.  No TTP.  Encouraged ice and compression.  Pt expressed understanding and is in agreement w/ plan.  ?

## 2021-10-10 NOTE — Patient Instructions (Signed)
Follow up as needed or as scheduled ?CONTINUE the Fluticasone (Flonase) daily ?ADD the Cetirizine (Zyrtec) daily to improve your sinus headache ?Apply compression to your R knee to help decrease the swelling ?ICE! ?Keep your arm wounds clean and dry.  Thankfully they don't look infected or need stitches ?Call with any questions or concerns ?Hang in there!! ?

## 2021-12-12 ENCOUNTER — Other Ambulatory Visit: Payer: Self-pay | Admitting: Registered Nurse

## 2021-12-12 DIAGNOSIS — E785 Hyperlipidemia, unspecified: Secondary | ICD-10-CM

## 2021-12-23 ENCOUNTER — Telehealth: Payer: Self-pay | Admitting: Family Medicine

## 2021-12-23 NOTE — Telephone Encounter (Signed)
Caller name: Kevonte Vanecek  On DPR? :yes/no: Yes  Call back number: [phone]  Provider they see: Birdie Riddle  Reason for call: Pt called stating that Dr.Tabori suppose to be providing him with some great eye doctor in his area. Please advise!

## 2021-12-23 NOTE — Telephone Encounter (Signed)
Referral was placed on 5/4 at the time of his Medicare Wellness Visit.  I will follow up w/ Danae Chen when she gets back to get status of referral

## 2022-01-30 DIAGNOSIS — D225 Melanocytic nevi of trunk: Secondary | ICD-10-CM | POA: Diagnosis not present

## 2022-01-30 DIAGNOSIS — L570 Actinic keratosis: Secondary | ICD-10-CM | POA: Diagnosis not present

## 2022-01-30 DIAGNOSIS — L821 Other seborrheic keratosis: Secondary | ICD-10-CM | POA: Diagnosis not present

## 2022-01-30 DIAGNOSIS — D0439 Carcinoma in situ of skin of other parts of face: Secondary | ICD-10-CM | POA: Diagnosis not present

## 2022-01-30 DIAGNOSIS — X32XXXD Exposure to sunlight, subsequent encounter: Secondary | ICD-10-CM | POA: Diagnosis not present

## 2022-02-16 DIAGNOSIS — Z961 Presence of intraocular lens: Secondary | ICD-10-CM | POA: Diagnosis not present

## 2022-02-16 DIAGNOSIS — H524 Presbyopia: Secondary | ICD-10-CM | POA: Diagnosis not present

## 2022-02-16 DIAGNOSIS — H4321 Crystalline deposits in vitreous body, right eye: Secondary | ICD-10-CM | POA: Diagnosis not present

## 2022-02-16 DIAGNOSIS — H0102B Squamous blepharitis left eye, upper and lower eyelids: Secondary | ICD-10-CM | POA: Diagnosis not present

## 2022-03-21 ENCOUNTER — Telehealth: Payer: Self-pay | Admitting: Cardiology

## 2022-03-21 DIAGNOSIS — E785 Hyperlipidemia, unspecified: Secondary | ICD-10-CM

## 2022-03-21 MED ORDER — ATORVASTATIN CALCIUM 40 MG PO TABS: ORAL_TABLET | ORAL | 0 refills | Status: DC

## 2022-03-21 NOTE — Telephone Encounter (Signed)
*  STAT* If patient is at the pharmacy, call can be transferred to refill team.   1. Which medications need to be refilled? (please list name of each medication and dose if known) atorvastatin (LIPITOR) 40 MG tablet  2. Which pharmacy/location (including street and city if local pharmacy) is medication to be sent to? [id number]- [place], [place] - [address]  3. Do they need a 30 day or 90 day supply? 90 day  Patient is out medication

## 2022-03-22 DIAGNOSIS — X32XXXD Exposure to sunlight, subsequent encounter: Secondary | ICD-10-CM | POA: Diagnosis not present

## 2022-03-22 DIAGNOSIS — Z85828 Personal history of other malignant neoplasm of skin: Secondary | ICD-10-CM | POA: Diagnosis not present

## 2022-03-22 DIAGNOSIS — Z08 Encounter for follow-up examination after completed treatment for malignant neoplasm: Secondary | ICD-10-CM | POA: Diagnosis not present

## 2022-03-22 DIAGNOSIS — L570 Actinic keratosis: Secondary | ICD-10-CM | POA: Diagnosis not present

## 2022-04-05 ENCOUNTER — Ambulatory Visit (INDEPENDENT_AMBULATORY_CARE_PROVIDER_SITE_OTHER): Payer: Medicare HMO | Admitting: Family Medicine

## 2022-04-05 ENCOUNTER — Encounter: Payer: Self-pay | Admitting: Family Medicine

## 2022-04-05 VITALS — BP 110/68 | HR 63 | Temp 97.9°F | Resp 17 | Ht 73.0 in | Wt 178.0 lb

## 2022-04-05 DIAGNOSIS — L729 Follicular cyst of the skin and subcutaneous tissue, unspecified: Secondary | ICD-10-CM

## 2022-04-05 DIAGNOSIS — L089 Local infection of the skin and subcutaneous tissue, unspecified: Secondary | ICD-10-CM

## 2022-04-05 MED ORDER — DOXYCYCLINE HYCLATE 100 MG PO TABS: 100.0000 mg | ORAL_TABLET | Freq: Two times a day (BID) | ORAL | 0 refills | Status: DC

## 2022-04-05 NOTE — Progress Notes (Signed)
   Subjective:    Patient ID: Jonette Mate, male    DOB: Aug 19, 1930, 88 y.o.   MRN: [id number]  HPI Skin lesion- pt reports he has had 'a hard place' on his chest 'for a long time'.  In the last 1-2 weeks area has enlarged, become painful, red, and started draining.  Denies fevers or chills   Review of Systems For ROS see HPI     Objective:   Physical Exam Vitals reviewed.  Constitutional:      General: He is not in acute distress.    Appearance: Normal appearance. He is not ill-appearing.  Skin:    General: Skin is warm and dry.     Findings: Erythema (overlying 1" cystic lesion draining pus from 2 central pores located along prior lung resection scar on R chest) present.  Neurological:     General: No focal deficit present.     Mental Status: He is alert and oriented to person, place, and time.  Psychiatric:        Mood and Affect: Mood normal.        Behavior: Behavior normal.        Thought Content: Thought content normal.           Assessment & Plan:  Infected cyst- new.  Area was prepped w/ alcohol pad and gentle but steady pressure was applied.  Copious amounts of foul smelling pus were expressed.  Fearful to do a full I&D given the location in a previous scar.  Will start abx to cover for MRSA (Doxycycline '100mg'$  BID) and refer to surgery.  Pt expressed understanding and is in agreement w/ plan.

## 2022-04-05 NOTE — Patient Instructions (Signed)
Follow up as needed or as scheduled We'll call you to schedule your surgery appt START the Doxycycline twice daily- take w/ food Apply hot compresses to the area to help it drain Call with any questions or concerns Hang in there!!!

## 2022-04-13 ENCOUNTER — Other Ambulatory Visit: Payer: Self-pay | Admitting: Cardiology

## 2022-04-13 DIAGNOSIS — E785 Hyperlipidemia, unspecified: Secondary | ICD-10-CM

## 2022-04-18 DIAGNOSIS — L720 Epidermal cyst: Secondary | ICD-10-CM | POA: Diagnosis not present

## 2022-05-02 ENCOUNTER — Other Ambulatory Visit: Payer: Self-pay | Admitting: Surgery

## 2022-05-02 DIAGNOSIS — L720 Epidermal cyst: Secondary | ICD-10-CM | POA: Diagnosis not present

## 2022-05-26 ENCOUNTER — Encounter: Payer: Self-pay | Admitting: Cardiology

## 2022-05-26 ENCOUNTER — Ambulatory Visit: Payer: No Typology Code available for payment source | Attending: Cardiology | Admitting: Cardiology

## 2022-05-26 VITALS — BP 134/76 | HR 65 | Ht 72.0 in | Wt 177.2 lb

## 2022-05-26 DIAGNOSIS — R002 Palpitations: Secondary | ICD-10-CM

## 2022-05-26 DIAGNOSIS — E7800 Pure hypercholesterolemia, unspecified: Secondary | ICD-10-CM | POA: Diagnosis not present

## 2022-05-26 LAB — HEPATIC FUNCTION PANEL
ALT: 13 IU/L (ref 0–44)
AST: 18 IU/L (ref 0–40)
Albumin: 4.4 g/dL (ref 3.7–4.7)
Alkaline Phosphatase: 79 IU/L (ref 44–121)
Bilirubin Total: 0.5 mg/dL (ref 0.0–1.2)
Bilirubin, Direct: 0.18 mg/dL (ref 0.00–0.40)
Total Protein: 6.8 g/dL (ref 6.0–8.5)

## 2022-05-26 LAB — LIPID PANEL
Chol/HDL Ratio: 2.5 ratio (ref 0.0–5.0)
Cholesterol, Total: 116 mg/dL (ref 100–199)
HDL: 46 mg/dL (ref >39)
LDL Chol Calc (NIH): 55 mg/dL (ref 0–99)
Triglycerides: 74 mg/dL (ref 0–149)
VLDL Cholesterol Cal: 15 mg/dL (ref 5–40)

## 2022-05-26 NOTE — Progress Notes (Signed)
HPI: FU palpitations, cardiomyopathy and family history of abdominal aortic aneurysm. Carotid dopplers 7/09 showed normal carotid arteries bilaterally. Myoview in September of 2013. Ejection fraction was 53% and the perfusion was normal. Abdominal ultrasound 7/18 showed proximal abdominal aorta measuring 2.5 x 2.5 cm.. Last echocardiogram November 2020 showed normal LV function, mild left ventricular hypertrophy, grade 1 diastolic dysfunction, moderate tricuspid regurgitation.  Since I last saw him the patient has dyspnea with more extreme activities but not with routine activities. It is relieved with rest. It is not associated with chest pain. There is no orthopnea, PND or pedal edema. There is no syncope or palpitations. There is no exertional chest pain.   Current Outpatient Medications  Medication Sig Dispense Refill   aspirin 81 MG EC tablet Take 81 mg by mouth daily.     atorvastatin (LIPITOR) 40 MG tablet TAKE 1 TABLET BY MOUTH DAILY AT 6 PM. SCHEDULE OFFICE VISIT FOR FUTURE REFILLS. 90 tablet 1   Calcium Carbonate-Vitamin D 600-400 MG-UNIT tablet Take 1 tablet by mouth 2 (two) times daily.     cetirizine (ZYRTEC) 10 MG tablet Take 1 tablet (10 mg total) by mouth daily. 30 tablet 11   co-enzyme Q-10 30 MG capsule Take 30 mg by mouth 2 (two) times daily.     fish oil-omega-3 fatty acids 1000 MG capsule Take 1 g by mouth daily. Nord[employer] naturals omega 3 TID     fluticasone (FLONASE) 50 MCG/ACT nasal spray USE 2 SPRAYS IN EACH NOSTRIL DAILY. 48 g 1   Multiple Vitamins-Minerals (VISION-VITE PRESERVE PO) Take by mouth. 2 per day     TURMERIC PO Take 2,000 mg by mouth daily.     Wheat Dextrin (EQ FIBER POWDER PO) Take by mouth.     No current facility-administered medications for this visit.     Past Medical History:  Diagnosis Date   Benign tumor of lung    BPH (benign prostatic hyperplasia)    Cardiomyopathy ([place])    Cataract    Dilated aortic root ([facility])    Hx of mild    Hyperlipidemia    Mitral valve prolapse    irregular heart beat   Palpitations    Pure hypercholesterolemia    RBBB (right bundle branch block)     Past Surgical History:  Procedure Laterality Date   BLEPHAROPLASTY Bilateral    CATARACT EXTRACTION, BILATERAL     COSMETIC SURGERY     Excision on neck     Excision of 2 cm soft tissue mass of left neck   INGUINAL HERNIA REPAIR Bilateral    Resection of Lung Right    Hx of, right lung secondary to benign tumor   SHOULDER ARTHROSCOPY Left    testicular hernia     VASECTOMY      Social History   Socioeconomic History   Marital status: Married    Spouse name: Not on file   Number of children: 1   Years of education: Not on file   Highest education level: Not on file  Occupational History   Occupation: Farmer-retired  Tobacco Use   Smoking status: Former    Types: Cigarettes    Quit date: 1982    Years since quitting: 42.0   Smokeless tobacco: Never   Tobacco comments:    quit 80's  Vaping Use   Vaping Use: Never used  Substance and Sexual Activity   Alcohol use: No    Alcohol/week: 0.0 standard drinks of  alcohol   Drug use: No   Sexual activity: Never  Other Topics Concern   Not on file  Social History Narrative   Exercise 20-30 min walks. Patient is married. Education: [employer].    Social Determinants of Health   Financial Resource Strain: Low Risk  (10/06/2021)   Overall Financial Resource Strain (CARDIA)    Difficulty of Paying Living Expenses: Not hard at all  Food Insecurity: No Food Insecurity (10/06/2021)   Hunger Vital Sign    Worried About Running Out of Food in the Last Year: Never true    Ran Out of Food in the Last Year: Never true  Transportation Needs: No Transportation Needs (10/06/2021)   PRAPARE - Hydrologist (Medical): No    Lack of Transportation (Non-Medical): No  Physical Activity: Insufficiently Active (10/06/2021)   Exercise Vital Sign    Days of Exercise per  Week: 5 days    Minutes of Exercise per Session: 20 min  Stress: No Stress Concern Present (10/06/2021)   [place]    Feeling of Stress : Not at all  Social Connections: Moderately Integrated (10/06/2021)   Social Connection and Isolation Panel [NHANES]    Frequency of Communication with Friends and Family: Twice a week    Frequency of Social Gatherings with Friends and Family: Twice a week    Attends Religious Services: More than 4 times per year    Active Member of [employer] or Organizations: No    Attends Archivist Meetings: Never    Marital Status: Married  Human resources officer Violence: Not At Risk (10/06/2021)   Humiliation, Afraid, Rape, and Kick questionnaire    Fear of Current or Ex-Partner: No    Emotionally Abused: No    Physically Abused: No    Sexually Abused: No    Family History  Problem Relation Age of Onset   Aortic aneurysm Father    Heart disease Father    Hypertension Father    Hyperlipidemia Father    Lung disease Sister    Breast cancer Sister    Pancreatic cancer Mother    Hyperlipidemia Other    Colon cancer Sister    Breast cancer Sister    Colon polyps Sister    Esophageal cancer Neg Hx    Stomach cancer Neg Hx    Rectal cancer Neg Hx     ROS: no fevers or chills, productive cough, hemoptysis, dysphasia, odynophagia, melena, hematochezia, dysuria, hematuria, rash, seizure activity, orthopnea, PND, pedal edema, claudication. Remaining systems are negative.  Physical Exam: Well-developed well-nourished in no acute distress.  Skin is warm and dry.  HEENT is normal.  Neck is supple.  Chest is clear to auscultation with normal expansion.  Cardiovascular exam is regular rate and rhythm.  Abdominal exam nontender or distended. No masses palpated. Extremities show no edema. neuro grossly intact  ECG-normal sinus rhythm at a rate of 65, first-degree AV block, right bundle branch  block, cannot rule out septal infarct.  Personally reviewed  [name]  1 history of cardiomyopathy-LV function has improved on most recent echocardiogram.  2 history of dilated aortic root-this was not evident on his most recent echocardiogram.  3 history of tricuspid regurgitation-plan conservative measures given patient's age.  He is not having symptoms.  4 palpitations-no recent symptoms.  Beta-blocker was previously discontinued due to bradycardia.  5 hyperlipidemia-continue statin.  Kirk Ruths, MD

## 2022-05-26 NOTE — Patient Instructions (Signed)
    Follow-Up: At [facility], you and your health needs are our priority.  As part of our continuing mission to provide you with exceptional heart care, we have created designated Provider Care Teams.  These Care Teams include your primary Cardiologist (physician) and Advanced Practice Providers (APPs -  Physician Assistants and Nurse Practitioners) who all work together to provide you with the care you need, when you need it.  We recommend signing up for the patient portal called "MyChart".  Sign up information is provided on this After Visit Summary.  MyChart is used to connect with patients for Virtual Visits (Telemedicine).  Patients are able to view lab/test results, encounter notes, upcoming appointments, etc.  Non-urgent messages can be sent to your provider as well.   To learn more about what you can do with MyChart, go to [email or web address].    Your next appointment:   12 month(s)  The format for your next appointment:   In Person  Provider:   Brian Crenshaw MD          

## 2022-06-26 DIAGNOSIS — L708 Other acne: Secondary | ICD-10-CM | POA: Diagnosis not present

## 2022-06-26 DIAGNOSIS — C4442 Squamous cell carcinoma of skin of scalp and neck: Secondary | ICD-10-CM | POA: Diagnosis not present

## 2022-06-26 DIAGNOSIS — L821 Other seborrheic keratosis: Secondary | ICD-10-CM | POA: Diagnosis not present

## 2022-06-26 DIAGNOSIS — L570 Actinic keratosis: Secondary | ICD-10-CM | POA: Diagnosis not present

## 2022-06-26 DIAGNOSIS — X32XXXD Exposure to sunlight, subsequent encounter: Secondary | ICD-10-CM | POA: Diagnosis not present

## 2022-09-07 ENCOUNTER — Other Ambulatory Visit: Payer: Self-pay | Admitting: Family Medicine

## 2022-09-11 ENCOUNTER — Ambulatory Visit: Payer: No Typology Code available for payment source | Admitting: Cardiology

## 2022-09-20 ENCOUNTER — Telehealth: Payer: Self-pay | Admitting: Family Medicine

## 2022-09-20 NOTE — Telephone Encounter (Signed)
Called patient to schedule Medicare Annual Wellness Visit (AWV). Left message for patient to call back and schedule Medicare Annual Wellness Visit (AWV).  Last date of AWV: 10/06/2021   Please schedule an AWVS appointment at any time with [facility] ANNUAL WELLNESS VISIT.  If any questions, please contact me at [phone].    Thank you,  [facility] Support [facility] Direct dial  [phone]

## 2022-10-04 ENCOUNTER — Telehealth: Payer: Self-pay | Admitting: Family Medicine

## 2022-10-04 NOTE — Telephone Encounter (Signed)
Contacted Sharman Cheek to schedule their annual wellness visit. Appointment made for 10/12/2022.  Thank you,  [facility] Support [facility] Direct dial  [phone]

## 2022-10-06 ENCOUNTER — Other Ambulatory Visit: Payer: Self-pay | Admitting: Cardiology

## 2022-10-06 DIAGNOSIS — E785 Hyperlipidemia, unspecified: Secondary | ICD-10-CM

## 2022-10-12 ENCOUNTER — Ambulatory Visit (INDEPENDENT_AMBULATORY_CARE_PROVIDER_SITE_OTHER): Payer: Medicare HMO | Admitting: *Deleted

## 2022-10-12 DIAGNOSIS — Z0000 Encounter for general adult medical examination without abnormal findings: Secondary | ICD-10-CM | POA: Diagnosis not present

## 2022-10-12 NOTE — Progress Notes (Signed)
Subjective:   [name] [name] is a 89 y.o. male who presents for Medicare Annual/Subsequent preventive examination.  I connected with  Sharman Cheek on 10/12/22 by a telephone enabled telemedicine application and verified that I am speaking with the correct person using two identifiers.   I discussed the limitations of evaluation and management by telemedicine. The patient expressed understanding and agreed to proceed.  Patient location: home  Provider location: telephone/home    Review of Systems     Cardiac Risk Factors include: advanced age (>77men, >39 women);male gender     Objective:    There were no vitals filed for this visit. There is no height or weight on file to calculate BMI.     10/12/2022    9:37 AM 10/06/2021    8:23 AM 09/27/2020    8:11 AM 12/08/2019    8:46 AM 03/06/2019    9:20 AM 02/28/2018    9:15 AM 09/20/2017    9:00 AM  Advanced Directives  Does Patient Have a Medical Advance Directive? Yes Yes Yes Yes Yes Yes No  Type of Estate agent of [employer] Power of [place];Living will Healthcare Power of [place];Living will Healthcare Power of [place];Living will Healthcare Power of [place];Living will Healthcare Power of [place];Living will   Does patient want to make changes to medical advance directive?    No - Patient declined     Copy of Healthcare Power of Attorney in Chart? Yes - validated most recent copy scanned in chart (See row information) No - copy requested Yes - validated most recent copy scanned in chart (See row information)  Yes - validated most recent copy scanned in chart (See row information) Yes   Would patient like information on creating a medical advance directive?    No - Patient declined   No - Patient declined    Current Medications (verified) Outpatient Encounter Medications as of 10/12/2022  Medication Sig   atorvastatin (LIPITOR) 40 MG tablet TAKE 1 TABLET BY MOUTH DAILY AT 6 PM. SCHEDULE OFFICE  VISIT FOR FUTURE REFILLS.   Calcium Carbonate-Vitamin D 600-400 MG-UNIT tablet Take 1 tablet by mouth 2 (two) times daily.   cetirizine (ZYRTEC) 10 MG tablet TAKE 1 TABLET BY MOUTH EVERY DAY   co-enzyme Q-10 30 MG capsule Take 30 mg by mouth 2 (two) times daily.   fish oil-omega-3 fatty acids 1000 MG capsule Take 1 g by mouth daily. Nord[employer] naturals omega 3 TID   fluticasone (FLONASE) 50 MCG/ACT nasal spray USE 2 SPRAYS IN EACH NOSTRIL DAILY.   Multiple Vitamins-Minerals (VISION-VITE PRESERVE PO) Take by mouth. 2 per day   TURMERIC PO Take 2,000 mg by mouth daily.   Wheat Dextrin (EQ FIBER POWDER PO) Take by mouth.   aspirin 81 MG EC tablet Take 81 mg by mouth daily. (Patient not taking: Reported on 10/12/2022)   No facility-administered encounter medications on file as of 10/12/2022.    Allergies (verified) Prednisone   History: Past Medical History:  Diagnosis Date   Benign tumor of lung    BPH (benign prostatic hyperplasia)    Cardiomyopathy (HCC)    Cataract    Dilated aortic root (HCC)    Hx of mild   Hyperlipidemia    Mitral valve prolapse    irregular heart beat   Palpitations    Pure hypercholesterolemia    RBBB (right bundle branch block)    Past Surgical History:  Procedure Laterality Date   BLEPHAROPLASTY Bilateral  CATARACT EXTRACTION, BILATERAL     COSMETIC SURGERY     Excision on neck     Excision of 2 cm soft tissue mass of left neck   INGUINAL HERNIA REPAIR Bilateral    Resection of Lung Right    Hx of, right lung secondary to benign tumor   SHOULDER ARTHROSCOPY Left    testicular hernia     VASECTOMY     Family History  Problem Relation Age of Onset   Aortic aneurysm Father    Heart disease Father    Hypertension Father    Hyperlipidemia Father    Lung disease Sister    Breast cancer Sister    Pancreatic cancer Mother    Hyperlipidemia Other    Colon cancer Sister    Breast cancer Sister    Colon polyps Sister    Esophageal cancer Neg Hx     Stomach cancer Neg Hx    Rectal cancer Neg Hx    Social History   Socioeconomic History   Marital status: Married    Spouse name: Not on file   Number of children: 1   Years of education: Not on file   Highest education level: Not on file  Occupational History   Occupation: Farmer-retired  Tobacco Use   Smoking status: Former    Types: Cigarettes    Quit date: 1982    Years since quitting: 42.3   Smokeless tobacco: Never   Tobacco comments:    quit 80's  Vaping Use   Vaping Use: Never used  Substance and Sexual Activity   Alcohol use: No    Alcohol/week: 0.0 standard drinks of alcohol   Drug use: No   Sexual activity: Never  Other Topics Concern   Not on file  Social History Narrative   Exercise 20-30 min walks. Patient is married. Education: [employer].    Social Determinants of Health   Financial Resource Strain: Low Risk  (10/06/2021)   Overall Financial Resource Strain (CARDIA)    Difficulty of Paying Living Expenses: Not hard at all  Food Insecurity: No Food Insecurity (10/06/2021)   Hunger Vital Sign    Worried About Running Out of Food in the Last Year: Never true    Ran Out of Food in the Last Year: Never true  Transportation Needs: No Transportation Needs (10/06/2021)   PRAPARE - Administrator, Civil Service (Medical): No    Lack of Transportation (Non-Medical): No  Physical Activity: Insufficiently Active (10/06/2021)   Exercise Vital Sign    Days of Exercise per Week: 5 days    Minutes of Exercise per Session: 20 min  Stress: No Stress Concern Present (10/06/2021)   [employer] of Occupational Health - Occupational Stress Questionnaire    Feeling of Stress : Not at all  Social Connections: Moderately Integrated (10/06/2021)   Social Connection and Isolation Panel [NHANES]    Frequency of Communication with Friends and Family: Twice a week    Frequency of Social Gatherings with Friends and Family: Twice a week    Attends Religious Services: More  than 4 times per year    Active Member of [employer] or Organizations: No    Attends Banker Meetings: Never    Marital Status: Married    Tobacco Counseling Counseling given: Not Answered Tobacco comments: quit 80's   Clinical Intake:  Pre-visit preparation completed: Yes  Pain : No/denies pain     Diabetes: No  How often do you need to have  someone help you when you read instructions, pamphlets, or other written materials from your doctor or pharmacy?: 2 - Rarely  Diabetic?  no  Interpreter Needed?: No  Information entered by :: Remi Haggard LPN   Activities of Daily Living    10/12/2022    9:37 AM 10/07/2022    8:59 PM  In your present state of health, do you have any difficulty performing the following activities:  Hearing? 0 0  Vision? 0 0  Difficulty concentrating or making decisions? 0 0  Walking or climbing stairs? 0 0  Dressing or bathing? 0 0  Doing errands, shopping? 0 0  Preparing Food and eating ? N N  Using the Toilet? N N  In the past six months, have you accidently leaked urine? N N  Do you have problems with loss of bowel control? N N  Managing your Medications? N N  Managing your Finances? N N  Housekeeping or managing your Housekeeping? N N    Patient Care Team: Sheliah Hatch, MD as PCP - General (Family Medicine) Jens Som Madolyn Frieze, MD as PCP - Cardiology (Cardiology) Jens Som Madolyn Frieze, MD (Cardiology) Ihor Gully, MD (Inactive) (Urology) Nyoka Cowden, MD as Consulting Physician (Pulmonary Disease) Nita Sells, MD (Dermatology) Myrtie Neither Andreas Blower, MD as Consulting Physician (Gastroenterology)  Indicate any recent Medical Services you may have received from other than [facility] providers in the past year (date may be approximate).     Assessment:   This is a routine wellness examination for [place].  Hearing/Vision screen Hearing Screening - Comments:: No trouble hearing Vision Screening - Comments:: [facility] Up to  date   Dietary issues and exercise activities discussed: Current Exercise Habits: Home exercise routine, Type of exercise: stretching, Time (Minutes): 30, Frequency (Times/Week): 4, Weekly Exercise (Minutes/Week): 120, Intensity: Mild   Goals Addressed               This Visit's Progress     patient (pt-stated)   On track     Maintain current health by staying active.       Patient Stated        Continue current lifestyle       Depression Screen    04/05/2022   11:22 AM 10/10/2021    8:18 AM 10/06/2021    8:25 AM 10/06/2021    8:21 AM 04/13/2021   12:07 PM 09/27/2020    8:14 AM 06/22/2020   12:38 PM  PHQ 2/9 Scores  PHQ - 2 Score 0 0 0 0 0 0 0  PHQ- 9 Score 4 4   0  0    Fall Risk    10/12/2022    9:36 AM 10/07/2022    8:59 PM 04/05/2022   11:22 AM 10/10/2021    8:18 AM 10/06/2021    8:24 AM  Fall Risk   Falls in the past year? 0 0 1 1 1   Number falls in past yr: 0 0 0 0 0  Injury with Fall? 0 0 1 1 0  Risk for fall due to :    History of fall(s)   Follow up Falls evaluation completed;Education provided;Falls prevention discussed  Falls evaluation completed Falls evaluation completed Falls evaluation completed    FALL RISK PREVENTION PERTAINING TO THE HOME:  Any stairs in or around the home? Yes  If so, are there any without handrails? No  Home free of loose throw rugs in walkways, pet beds, electrical cords, etc? Yes  Adequate lighting in  your home to reduce risk of falls? Yes   ASSISTIVE DEVICES UTILIZED TO PREVENT FALLS:  Life alert? No  Use of a cane, walker or w/c? No  Grab bars in the bathroom? Yes  Shower chair or bench in shower? Yes  Elevated toilet seat or a handicapped toilet? No   TIMED UP AND GO:  Was the test performed? No .    Cognitive Function:    02/28/2018    9:17 AM  MMSE - Mini Mental State Exam  Orientation to time 3  Orientation to Place 5  Registration 3  Attention/ Calculation 3  Recall 0  Language- name 2 objects 2  Language-  repeat 1  Language- follow 3 step command 3  Language- read & follow direction 1  Write a sentence 1  Copy design 1  Total score 23        09/27/2020    8:18 AM  6CIT Screen  What Year? 0 points  What month? 0 points  What time? 0 points  Count back from 20 0 points  Months in reverse 0 points  Repeat phrase 2 points  Total Score 2 points    Immunizations Immunization History  Administered Date(s) Administered   Fluad Quad(high Dose 65+) 02/03/2020   Influenza Split 06/05/2009, 02/22/2013, 02/26/2015   Influenza, High Dose Seasonal PF 03/02/2014, 02/18/2018, 01/27/2019   Influenza-Unspecified 02/01/2016, 01/03/2017, 02/01/2022   PFIZER(Purple Top)SARS-COV-2 Vaccination 07/26/2019, 08/19/2019, 04/13/2020   Pneumococcal Conjugate-13 12/09/2013   Pneumococcal Polysaccharide-23 06/05/2004, 08/28/2016   Td 09/21/2009   Tdap 06/05/2016   Zoster Recombinat (Shingrix) 04/16/2021   Zoster, Live 06/05/2008    TDAP status: Up to date  Flu Vaccine status: Up to date  Pneumococcal vaccine status: Up to date  Covid-19 vaccine status: Information provided on how to obtain vaccines.   Qualifies for Shingles Vaccine? Yes   Zostavax completed No   Shingrix Completed?: No.    Education has been provided regarding the importance of this vaccine. Patient has been advised to call insurance company to determine out of pocket expense if they have not yet received this vaccine. Advised may also receive vaccine at local pharmacy or Health Dept. Verbalized acceptance and understanding.  Screening Tests Health Maintenance  Topic Date Due   Zoster Vaccines- Shingrix (2 of 2) 06/11/2021   INFLUENZA VACCINE  01/04/2023   Medicare Annual Wellness (AWV)  10/12/2023   DTaP/Tdap/Td (3 - Td or Tdap) 06/05/2026   Pneumonia Vaccine 18+ Years old  Completed   HPV VACCINES  Aged Out   COVID-19 Vaccine  Discontinued    Health Maintenance  Health Maintenance Due  Topic Date Due   Zoster  Vaccines- Shingrix (2 of 2) 06/11/2021    Colorectal cancer screening: No longer required.   Lung Cancer Screening: (Low Dose CT Chest recommended if Age 56-80 years, 30 pack-year currently smoking OR have quit w/in 15years.) does not qualify.   Lung Cancer Screening Referral:   Additional Screening:  Hepatitis C Screening: does not qualify; Completed   Vision Screening: Recommended annual ophthalmology exams for early detection of glaucoma and other disorders of the eye. Is the patient up to date with their annual eye exam?  Yes  Who is the provider or what is the name of the office in which the patient attends annual eye exams? [facility] If pt is not established with a provider, would they like to be referred to a provider to establish care? No .   Dental Screening: Recommended annual dental  exams for proper oral hygiene  Community Resource Referral / Chronic Care Management: CRR required this visit?  No   CCM required this visit?  No      Plan:     I have personally reviewed and noted the following in the patient's chart:   Medical and social history Use of alcohol, tobacco or illicit drugs  Current medications and supplements including opioid prescriptions. Patient is not currently taking opioid prescriptions. Functional ability and status Nutritional status Physical activity Advanced directives List of other physicians Hospitalizations, surgeries, and ER visits in previous 12 months Vitals Screenings to include cognitive, depression, and falls Referrals and appointments  In addition, I have reviewed and discussed with patient certain preventive protocols, quality metrics, and best practice recommendations. A written personalized care plan for preventive services as well as general preventive health recommendations were provided to patient.     Remi Haggard, LPN   02/08/453   Nurse Notes:

## 2022-10-18 DIAGNOSIS — H0102B Squamous blepharitis left eye, upper and lower eyelids: Secondary | ICD-10-CM | POA: Diagnosis not present

## 2022-10-18 DIAGNOSIS — H02115 Cicatricial ectropion of left lower eyelid: Secondary | ICD-10-CM | POA: Diagnosis not present

## 2022-10-28 ENCOUNTER — Other Ambulatory Visit: Payer: Self-pay | Admitting: Cardiology

## 2022-10-28 DIAGNOSIS — E785 Hyperlipidemia, unspecified: Secondary | ICD-10-CM

## 2022-11-01 DIAGNOSIS — H02115 Cicatricial ectropion of left lower eyelid: Secondary | ICD-10-CM | POA: Diagnosis not present

## 2022-11-01 DIAGNOSIS — H0102B Squamous blepharitis left eye, upper and lower eyelids: Secondary | ICD-10-CM | POA: Diagnosis not present

## 2022-12-25 ENCOUNTER — Telehealth: Payer: Self-pay

## 2022-12-25 NOTE — Telephone Encounter (Signed)
Reached out to patient to schedule follow up for chronic conditions. Scheduled for 01/12/23. Elijio Miles Population health Specialist

## 2022-12-27 DIAGNOSIS — S61001A Unspecified open wound of right thumb without damage to nail, initial encounter: Secondary | ICD-10-CM | POA: Diagnosis not present

## 2023-01-12 ENCOUNTER — Ambulatory Visit (INDEPENDENT_AMBULATORY_CARE_PROVIDER_SITE_OTHER): Payer: Medicare HMO | Admitting: Family Medicine

## 2023-01-12 ENCOUNTER — Encounter: Payer: Self-pay | Admitting: Family Medicine

## 2023-01-12 VITALS — BP 118/64 | HR 65 | Temp 97.9°F | Ht 72.0 in | Wt 175.2 lb

## 2023-01-12 DIAGNOSIS — E7800 Pure hypercholesterolemia, unspecified: Secondary | ICD-10-CM

## 2023-01-12 LAB — LIPID PANEL
Cholesterol: 117 mg/dL (ref 0–200)
HDL: 39 mg/dL — ABNORMAL LOW (ref >39.00)
LDL Cholesterol: 55 mg/dL (ref 0–99)
NonHDL: 77.54
Total CHOL/HDL Ratio: 3
Triglycerides: 115 mg/dL (ref 0.0–149.0)
VLDL: 23 mg/dL (ref 0.0–40.0)

## 2023-01-12 LAB — CBC WITH DIFFERENTIAL/PLATELET
Basophils Absolute: 0 10*3/uL (ref 0.0–0.1)
Basophils Relative: 0.6 % (ref 0.0–3.0)
Eosinophils Absolute: 0.2 10*3/uL (ref 0.0–0.7)
Eosinophils Relative: 3.1 % (ref 0.0–5.0)
HCT: 46.3 % (ref 39.0–52.0)
Hemoglobin: 15 g/dL (ref 13.0–17.0)
Lymphocytes Relative: 21 % (ref 12.0–46.0)
Lymphs Abs: 1.2 10*3/uL (ref 0.7–4.0)
MCHC: 32.3 g/dL (ref 30.0–36.0)
MCV: 93.7 fl (ref 78.0–100.0)
Monocytes Absolute: 0.8 10*3/uL (ref 0.1–1.0)
Monocytes Relative: 14.2 % — ABNORMAL HIGH (ref 3.0–12.0)
Neutro Abs: 3.5 10*3/uL (ref 1.4–7.7)
Neutrophils Relative %: 61.1 % (ref 43.0–77.0)
Platelets: 188 10*3/uL (ref 150.0–400.0)
RBC: 4.95 Mil/uL (ref 4.22–5.81)
RDW: 12.8 % (ref 11.5–15.5)
WBC: 5.8 10*3/uL (ref 4.0–10.5)

## 2023-01-12 LAB — BASIC METABOLIC PANEL
BUN: 16 mg/dL (ref 6–23)
CO2: 34 mEq/L — ABNORMAL HIGH (ref 19–32)
Calcium: 9.8 mg/dL (ref 8.4–10.5)
Chloride: 97 mEq/L (ref 96–112)
Creatinine, Ser: 1.03 mg/dL (ref 0.40–1.50)
GFR: 64.13 mL/min (ref >60.00)
Glucose, Bld: 85 mg/dL (ref 70–99)
Potassium: 4.6 mEq/L (ref 3.5–5.1)
Sodium: 136 mEq/L (ref 135–145)

## 2023-01-12 LAB — HEPATIC FUNCTION PANEL
ALT: 12 U/L (ref 0–53)
AST: 17 U/L (ref 0–37)
Albumin: 4.3 g/dL (ref 3.5–5.2)
Alkaline Phosphatase: 64 U/L (ref 39–117)
Bilirubin, Direct: 0.2 mg/dL (ref 0.0–0.3)
Total Bilirubin: 0.7 mg/dL (ref 0.2–1.2)
Total Protein: 6.8 g/dL (ref 6.0–8.3)

## 2023-01-12 LAB — TSH: TSH: 1.7 u[IU]/mL (ref 0.35–5.50)

## 2023-01-12 NOTE — Progress Notes (Signed)
   Subjective:    Patient ID: [name] [name], male    DOB: Oct 21, 1930, 89 y.o.   MRN: [id number]  HPI Hyperlipidemia- chronic problem, on Lipitor 40mg  daily.  Pt reports feeling 'pretty good most of the time'.  Pt continues to walk, does daily stretching and other exercises.  No CP, SOB, abd pain, N/V.   Review of Systems For ROS see HPI     Objective:   Physical Exam Vitals reviewed.  Constitutional:      General: He is not in acute distress.    Appearance: Normal appearance. He is well-developed. He is not ill-appearing.  HENT:     Head: Normocephalic and atraumatic.  Eyes:     Extraocular Movements: Extraocular movements intact.     Conjunctiva/sclera: Conjunctivae normal.     Pupils: Pupils are equal, round, and reactive to light.  Neck:     Thyroid: No thyromegaly.  Cardiovascular:     Rate and Rhythm: Normal rate and regular rhythm.     Pulses: Normal pulses.     Heart sounds: Normal heart sounds. No murmur heard. Pulmonary:     Effort: Pulmonary effort is normal. No respiratory distress.     Breath sounds: Normal breath sounds.  Abdominal:     General: Bowel sounds are normal. There is no distension.     Palpations: Abdomen is soft.  Musculoskeletal:     Cervical back: Normal range of motion and neck supple.     Right lower leg: No edema.     Left lower leg: No edema.  Lymphadenopathy:     Cervical: No cervical adenopathy.  Skin:    General: Skin is warm and dry.  Neurological:     General: No focal deficit present.     Mental Status: He is alert and oriented to person, place, and time.     Cranial Nerves: No cranial nerve deficit.  Psychiatric:        Mood and Affect: Mood normal.        Behavior: Behavior normal.           Assessment & Plan:

## 2023-01-12 NOTE — Assessment & Plan Note (Signed)
Chronic problem.  On Lipitor 40mg  daily w/o difficulty.  Still exercises regularly.  Check labs.  Adjust meds prn

## 2023-01-12 NOTE — Patient Instructions (Signed)
Follow up in 6 months to recheck cholesterol We'll notify you of your lab results and make any changes if needed Keep up the good work!  You look great! Call with any questions or concerns Enjoy the rest of your summer!

## 2023-01-15 ENCOUNTER — Telehealth: Payer: Self-pay

## 2023-01-15 NOTE — Telephone Encounter (Signed)
Pt is aware of lab results.

## 2023-01-15 NOTE — Telephone Encounter (Signed)
-----   Message from Neena Rhymes sent at 01/15/2023  7:33 AM EDT ----- Labs look great!!!  No changes at this time.  Keep up the good work!

## 2023-01-28 ENCOUNTER — Other Ambulatory Visit: Payer: Self-pay | Admitting: Cardiology

## 2023-01-28 DIAGNOSIS — E785 Hyperlipidemia, unspecified: Secondary | ICD-10-CM

## 2023-01-29 IMAGING — PT NM PET TUM IMG INITIAL (PI) SKULL BASE T - THIGH
1 of 7 series · 1 of 25 positions shown · non-contrast
Comparison: CT chest 04/05/2015.

CLINICAL DATA: Initial treatment strategy for pulmonary nodule.

EXAM:
NUCLEAR MEDICINE PET SKULL BASE TO THIGH
TECHNIQUE: 8.8 mCi F-18 FDG was injected intravenously. Full-ring PET imaging
was performed from the skull base to thigh after the radiotracer. CT
data was obtained and used for attenuation correction and anatomic
localization.
Fasting blood glucose: 97 mg/dl

[Series 4: ct sk_thigh 5.0 bf37 · axial · 5.0mm · 0.98mm/px · 1 of 243 slices shown]
[im 243/243  brain]
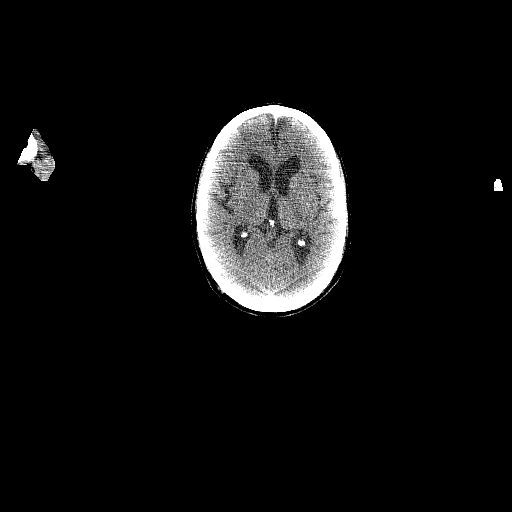

[1 of 25 positions shown; findings below may reference images not displayed]

FINDINGS: Mediastinal blood pool activity: SUV max

Liver activity: SUV max NA

NECK:

No abnormal hypermetabolism.

Incidental CT findings:

None.

CHEST:

Calcified mediastinal and hilar lymph nodes show mild
hypermetabolism. Index AP window lymph node measures 11 mm with SUV
max 4.2. No hypermetabolic axillary lymph nodes. Nodular subpleural
consolidation/thickening in the lateral right lower lobe (8/49) is
mildly hypermetabolic, SUV max 5.0. CT appearance is minimally more
prominent than on 04/05/2015. Nodular component measures
approximately 8 mm but there is respiratory motion. Similarly, there
is focal pleural thickening along the medial lower right hemithorax
(8/56) with minimal hypermetabolism, SUV max 2.7. Again, appearance
is minimally more prominent than on 04/05/2015. No additional areas
of abnormal hypermetabolism.

Incidental CT findings:

Atherosclerotic calcification of the aorta, aortic valve and
coronary arteries. Heart is enlarged. No pericardial or pleural
effusion. Right upper lobectomy with additional postoperative
changes in the right hemithorax.

ABDOMEN/PELVIS:

No abnormal hypermetabolism in the liver, adrenal glands, spleen or
pancreas. No hypermetabolic lymph nodes.

Incidental CT findings:

Liver, gallbladder and adrenal glands are unremarkable.
Low-attenuation lesions in both kidneys, measuring up to 4.5 cm on
the left, likely cysts. Spleen, pancreas, stomach and bowel are
unremarkable. Prostate is mildly enlarged. Bilateral inguinal
hernias contain fat.

SKELETON:

No abnormal hypermetabolism.

Incidental CT findings:

Degenerative changes in the spine. Thoracotomy changes on the right.
IMPRESSION: 1. Slightly progressive areas of subpleural thickening in the right
lower lobe with mild associated hypermetabolism. Question prior
pleurodesis and/or postoperative scarring. Malignancy is not
favored. Consider follow-up CT chest in 3-6 months, as clinically
indicated.
2. Calcified mediastinal and hilar lymph nodes, unchanged from
04/05/2015. Associated mild hypermetabolism is presumably
inflammatory in etiology.
3. Enlarged prostate.
4. Aortic atherosclerosis ([id number]). Coronary artery
calcification.

## 2023-02-15 DIAGNOSIS — L720 Epidermal cyst: Secondary | ICD-10-CM | POA: Diagnosis not present

## 2023-02-15 DIAGNOSIS — L570 Actinic keratosis: Secondary | ICD-10-CM | POA: Diagnosis not present

## 2023-02-15 DIAGNOSIS — X32XXXD Exposure to sunlight, subsequent encounter: Secondary | ICD-10-CM | POA: Diagnosis not present

## 2023-02-22 DIAGNOSIS — H0102B Squamous blepharitis left eye, upper and lower eyelids: Secondary | ICD-10-CM | POA: Diagnosis not present

## 2023-03-15 DIAGNOSIS — H02115 Cicatricial ectropion of left lower eyelid: Secondary | ICD-10-CM | POA: Diagnosis not present

## 2023-03-15 DIAGNOSIS — Z961 Presence of intraocular lens: Secondary | ICD-10-CM | POA: Diagnosis not present

## 2023-03-15 DIAGNOSIS — H524 Presbyopia: Secondary | ICD-10-CM | POA: Diagnosis not present

## 2023-03-15 DIAGNOSIS — H4321 Crystalline deposits in vitreous body, right eye: Secondary | ICD-10-CM | POA: Diagnosis not present

## 2023-04-09 DIAGNOSIS — L720 Epidermal cyst: Secondary | ICD-10-CM | POA: Diagnosis not present

## 2023-09-03 DIAGNOSIS — L821 Other seborrheic keratosis: Secondary | ICD-10-CM | POA: Diagnosis not present

## 2023-10-18 ENCOUNTER — Other Ambulatory Visit: Payer: Self-pay | Admitting: Cardiology

## 2023-10-18 DIAGNOSIS — E785 Hyperlipidemia, unspecified: Secondary | ICD-10-CM

## 2023-11-10 ENCOUNTER — Other Ambulatory Visit: Payer: Self-pay | Admitting: Cardiology

## 2023-11-10 DIAGNOSIS — E785 Hyperlipidemia, unspecified: Secondary | ICD-10-CM

## 2023-11-14 ENCOUNTER — Other Ambulatory Visit: Payer: Self-pay | Admitting: Cardiology

## 2023-11-14 DIAGNOSIS — E785 Hyperlipidemia, unspecified: Secondary | ICD-10-CM

## 2023-11-27 ENCOUNTER — Other Ambulatory Visit: Payer: Self-pay | Admitting: Cardiology

## 2023-11-27 DIAGNOSIS — E785 Hyperlipidemia, unspecified: Secondary | ICD-10-CM

## 2023-12-05 ENCOUNTER — Telehealth: Payer: Self-pay | Admitting: Cardiology

## 2023-12-05 DIAGNOSIS — E785 Hyperlipidemia, unspecified: Secondary | ICD-10-CM

## 2023-12-05 MED ORDER — ATORVASTATIN CALCIUM 40 MG PO TABS: ORAL_TABLET | ORAL | 0 refills | Status: DC

## 2023-12-05 NOTE — Telephone Encounter (Signed)
 Pt's medication was sent to pt's pharmacy as requested. Confirmation received.

## 2023-12-05 NOTE — Telephone Encounter (Signed)
*  STAT* If patient is at the pharmacy, call can be transferred to refill team.   1. Which medications need to be refilled? (please list name of each medication and dose if known)   atorvastatin  (LIPITOR) 40 MG tablet    4. Which pharmacy/location (including street and city if local pharmacy) is medication to be sent to?  [id number] - [place], [place] - [address]     5. Do they need a 30 day or 90 day supply? 90    Pt scheduled for 9/29

## 2024-02-19 ENCOUNTER — Ambulatory Visit (INDEPENDENT_AMBULATORY_CARE_PROVIDER_SITE_OTHER): Admitting: *Deleted

## 2024-02-19 VITALS — Ht 72.0 in | Wt 175.0 lb

## 2024-02-19 DIAGNOSIS — Z0000 Encounter for general adult medical examination without abnormal findings: Secondary | ICD-10-CM

## 2024-02-19 NOTE — Patient Instructions (Signed)
 [name] [name] , Thank you for taking time to come for your Medicare Wellness Visit. I appreciate your ongoing commitment to your health goals. Please review the following plan we discussed and let me know if I can assist you in the future.   Screening recommendations/referrals: Colonoscopy: no longer required Recommended yearly ophthalmology/optometry visit for glaucoma screening and checkup Recommended yearly dental visit for hygiene and checkup  Vaccinations: Influenza vaccine: up to date Pneumococcal vaccine:  Tdap vaccine:  Shingles vaccine:      Preventive Care 65 Years and Older, Male Preventive care refers to lifestyle choices and visits with your health care provider that can promote health and wellness. What does preventive care include? A yearly physical exam. This is also called an annual well check. Dental exams once or twice a year. Routine eye exams. Ask your health care provider how often you should have your eyes checked. Personal lifestyle choices, including: Daily care of your teeth and gums. Regular physical activity. Eating a healthy diet. Avoiding tobacco and drug use. Limiting alcohol use. Practicing safe sex. Taking low doses of aspirin every day. Taking vitamin and mineral supplements as recommended by your health care provider. What happens during an annual well check? The services and screenings done by your health care provider during your annual well check will depend on your age, overall health, lifestyle risk factors, and family history of disease. Counseling  Your health care provider may ask you questions about your: Alcohol use. Tobacco use. Drug use. Emotional well-being. Home and relationship well-being. Sexual activity. Eating habits. History of falls. Memory and ability to understand (cognition). Work and work Astronomer. Screening  You may have the following tests or measurements: Height, weight, and BMI. Blood pressure. Lipid and  cholesterol levels. These may be checked every 5 years, or more frequently if you are over 54 years old. Skin check. Lung cancer screening. You may have this screening every year starting at age 54 if you have a 30-pack-year history of smoking and currently smoke or have quit within the past 15 years. Fecal occult blood test (FOBT) of the stool. You may have this test every year starting at age 66. Flexible sigmoidoscopy or colonoscopy. You may have a sigmoidoscopy every 5 years or a colonoscopy every 10 years starting at age 35. Prostate cancer screening. Recommendations will vary depending on your family history and other risks. Hepatitis C blood test. Hepatitis B blood test. Sexually transmitted disease (STD) testing. Diabetes screening. This is done by checking your blood sugar (glucose) after you have not eaten for a while (fasting). You may have this done every 1-3 years. Abdominal aortic aneurysm (AAA) screening. You may need this if you are a current or former smoker. Osteoporosis. You may be screened starting at age 43 if you are at high risk. Talk with your health care provider about your test results, treatment options, and if necessary, the need for more tests. Vaccines  Your health care provider may recommend certain vaccines, such as: Influenza vaccine. This is recommended every year. Tetanus, diphtheria, and acellular pertussis (Tdap, Td) vaccine. You may need a Td booster every 10 years. Zoster vaccine. You may need this after age 60. Pneumococcal 13-valent conjugate (PCV13) vaccine. One dose is recommended after age 88. Pneumococcal polysaccharide (PPSV23) vaccine. One dose is recommended after age 88. Talk to your health care provider about which screenings and vaccines you need and how often you need them. This information is not intended to replace advice given to you by  your health care provider. Make sure you discuss any questions you have with your health care  provider. Document Released: 06/18/2015 Document Revised: 02/09/2016 Document Reviewed: 03/23/2015 Elsevier Interactive Patient Education  2017 [employer].  Fall Prevention in the Home Falls can cause injuries. They can happen to people of all ages. There are many things you can do to make your home safe and to help prevent falls. What can I do on the outside of my home? Regularly fix the edges of walkways and driveways and fix any cracks. Remove anything that might make you trip as you walk through a door, such as a raised step or threshold. Trim any bushes or trees on the path to your home. Use bright outdoor lighting. Clear any walking paths of anything that might make someone trip, such as rocks or tools. Regularly check to see if handrails are loose or broken. Make sure that both sides of any steps have handrails. Any raised decks and porches should have guardrails on the edges. Have any leaves, snow, or ice cleared regularly. Use sand or salt on walking paths during winter. Clean up any spills in your garage right away. This includes oil or grease spills. What can I do in the bathroom? Use night lights. Install grab bars by the toilet and in the tub and shower. Do not use towel bars as grab bars. Use non-skid mats or decals in the tub or shower. If you need to sit down in the shower, use a plastic, non-slip stool. Keep the floor dry. Clean up any water that spills on the floor as soon as it happens. Remove soap buildup in the tub or shower regularly. Attach bath mats securely with double-sided non-slip rug tape. Do not have throw rugs and other things on the floor that can make you trip. What can I do in the bedroom? Use night lights. Make sure that you have a light by your bed that is easy to reach. Do not use any sheets or blankets that are too big for your bed. They should not hang down onto the floor. Have a firm chair that has side arms. You can use this for support while  you get dressed. Do not have throw rugs and other things on the floor that can make you trip. What can I do in the kitchen? Clean up any spills right away. Avoid walking on wet floors. Keep items that you use a lot in easy-to-reach places. If you need to reach something above you, use a strong step stool that has a grab bar. Keep electrical cords out of the way. Do not use floor polish or wax that makes floors slippery. If you must use wax, use non-skid floor wax. Do not have throw rugs and other things on the floor that can make you trip. What can I do with my stairs? Do not leave any items on the stairs. Make sure that there are handrails on both sides of the stairs and use them. Fix handrails that are broken or loose. Make sure that handrails are as long as the stairways. Check any carpeting to make sure that it is firmly attached to the stairs. Fix any carpet that is loose or worn. Avoid having throw rugs at the top or bottom of the stairs. If you do have throw rugs, attach them to the floor with carpet tape. Make sure that you have a light switch at the top of the stairs and the bottom of the stairs. If you  do not have them, ask someone to add them for you. What else can I do to help prevent falls? Wear shoes that: Do not have high heels. Have rubber bottoms. Are comfortable and fit you well. Are closed at the toe. Do not wear sandals. If you use a stepladder: Make sure that it is fully opened. Do not climb a closed stepladder. Make sure that both sides of the stepladder are locked into place. Ask someone to hold it for you, if possible. Clearly mark and make sure that you can see: Any grab bars or handrails. First and last steps. Where the edge of each step is. Use tools that help you move around (mobility aids) if they are needed. These include: Canes. Walkers. Scooters. Crutches. Turn on the lights when you go into a dark area. Replace any light bulbs as soon as they burn  out. Set up your furniture so you have a clear path. Avoid moving your furniture around. If any of your floors are uneven, fix them. If there are any pets around you, be aware of where they are. Review your medicines with your doctor. Some medicines can make you feel dizzy. This can increase your chance of falling. Ask your doctor what other things that you can do to help prevent falls. This information is not intended to replace advice given to you by your health care provider. Make sure you discuss any questions you have with your health care provider. Document Released: 03/18/2009 Document Revised: 10/28/2015 Document Reviewed: 06/26/2014 Elsevier Interactive Patient Education  2017 [employer].

## 2024-02-19 NOTE — Progress Notes (Signed)
 Subjective:   [name] [name] is a 89 y.o. male who presents for Medicare Annual/Subsequent preventive examination.  Visit Complete: Virtual I connected with  [name] [name] on 02/19/24 by a audio enabled telemedicine application and verified that I am speaking with the correct person using two identifiers.  Patient Location: Home  Provider Location: Home Office  I discussed the limitations of evaluation and management by telemedicine. The patient expressed understanding and agreed to proceed.  Vital Signs: Because this visit was a virtual/telehealth visit, some criteria may be missing or patient reported. Any vitals not documented were not able to be obtained and vitals that have been documented are patient reported.    Cardiac Risk Factors include: advanced age (>71men, >46 women);male gender     Objective:    Today's Vitals   02/19/24 0822  Weight: 175 lb (79.4 kg)  Height: 6' (1.829 m)   Body mass index is 23.73 kg/m.     02/19/2024    8:21 AM 10/12/2022    9:37 AM 10/06/2021    8:23 AM 09/27/2020    8:11 AM 12/08/2019    8:46 AM 03/06/2019    9:20 AM 02/28/2018    9:15 AM  Advanced Directives  Does Patient Have a Medical Advance Directive? Yes Yes Yes Yes Yes Yes Yes   Type of Sales promotion account executive of [employer] Power of [place];Living will Healthcare Power of [place];Living will Healthcare Power of [place];Living will Healthcare Power of [place];Living will Healthcare Power of [place];Living will  Does patient want to make changes to medical advance directive?     No - Patient declined    Copy of Healthcare Power of Attorney in Chart? Yes - validated most recent copy scanned in chart (See row information) Yes - validated most recent copy scanned in chart (See row information) No - copy requested Yes - validated most recent copy scanned in chart (See row information)  Yes - validated most recent copy scanned in chart  (See row information) Yes   Would patient like information on creating a medical advance directive?     No - Patient declined       Data saved with a previous flowsheet row definition    Current Medications (verified) Outpatient Encounter Medications as of 02/19/2024  Medication Sig   atorvastatin  (LIPITOR) 40 MG tablet TAKE 1 TABLET BY MOUTH DAILY AT 6 PM.   Calcium  Carbonate-Vitamin D 600-400 MG-UNIT tablet Take 1 tablet by mouth 2 (two) times daily.   cetirizine  (ZYRTEC ) 10 MG tablet TAKE 1 TABLET BY MOUTH EVERY DAY   co-enzyme Q-10 30 MG capsule Take 30 mg by mouth 2 (two) times daily.   fish oil-omega-3 fatty acids 1000 MG capsule Take 1 g by mouth daily. Nordic naturals omega 3 TID   fluticasone  (FLONASE ) 50 MCG/ACT nasal spray USE 2 SPRAYS IN EACH NOSTRIL DAILY.   Multiple Vitamins-Minerals (VISION-VITE PRESERVE PO) Take by mouth. 2 per day   TURMERIC PO Take 2,000 mg by mouth daily.   Wheat Dextrin (EQ FIBER POWDER PO) Take by mouth.   No facility-administered encounter medications on file as of 02/19/2024.    Allergies (verified) Prednisone   History: Past Medical History:  Diagnosis Date   Benign tumor of lung    BPH (benign prostatic hyperplasia)    Cardiomyopathy (HCC)    Cataract    Dilated aortic root (HCC)    Hx of mild   Hyperlipidemia    Mitral valve  prolapse    irregular heart beat   Palpitations    Pure hypercholesterolemia    RBBB (right bundle branch block)    Past Surgical History:  Procedure Laterality Date   BLEPHAROPLASTY Bilateral    CATARACT EXTRACTION, BILATERAL     COSMETIC SURGERY     Excision on neck     Excision of 2 cm soft tissue mass of left neck   INGUINAL HERNIA REPAIR Bilateral    Resection of Lung Right    Hx of, right lung secondary to benign tumor   SHOULDER ARTHROSCOPY Left    testicular hernia     VASECTOMY     Family History  Problem Relation Age of Onset   Aortic aneurysm Father    Heart disease Father     Hypertension Father    Hyperlipidemia Father    Lung disease Sister    Breast cancer Sister    Pancreatic cancer Mother    Hyperlipidemia Other    Colon cancer Sister    Breast cancer Sister    Colon polyps Sister    Esophageal cancer Neg Hx    Stomach cancer Neg Hx    Rectal cancer Neg Hx    Social History   Socioeconomic History   Marital status: Married    Spouse name: Not on file   Number of children: 1   Years of education: Not on file   Highest education level: Some college, no degree  Occupational History   Occupation: Farmer-retired  Tobacco Use   Smoking status: Former    Current packs/day: 0.00    Types: Cigarettes    Quit date: 1982    Years since quitting: 43.7   Smokeless tobacco: Never   Tobacco comments:    quit 80's  Vaping Use   Vaping status: Never Used  Substance and Sexual Activity   Alcohol use: No    Alcohol/week: 0.0 standard drinks of alcohol   Drug use: No   Sexual activity: Never  Other Topics Concern   Not on file  Social History Narrative   Exercise 20-30 min walks. Patient is married. Education: [employer].    Social Drivers of Corporate investment banker Strain: Low Risk  (02/19/2024)   Overall Financial Resource Strain (CARDIA)    Difficulty of Paying Living Expenses: Not hard at all  Food Insecurity: No Food Insecurity (02/19/2024)   Hunger Vital Sign    Worried About Running Out of Food in the Last Year: Never true    Ran Out of Food in the Last Year: Never true  Transportation Needs: No Transportation Needs (02/19/2024)   PRAPARE - Administrator, Civil Service (Medical): No    Lack of Transportation (Non-Medical): No  Physical Activity: Insufficiently Active (02/19/2024)   Exercise Vital Sign    Days of Exercise per Week: 3 days    Minutes of Exercise per Session: 40 min  Stress: No Stress Concern Present (02/19/2024)   [employer] of Occupational Health - Occupational Stress Questionnaire    Feeling of Stress:  Not at all  Social Connections: Moderately Integrated (02/19/2024)   Social Connection and Isolation Panel    Frequency of Communication with Friends and Family: Three times a week    Frequency of Social Gatherings with Friends and Family: Once a week    Attends Religious Services: More than 4 times per year    Active Member of [employer] or Organizations: No    Attends Banker Meetings: Never  Marital Status: Married    Tobacco Counseling Counseling given: Not Answered Tobacco comments: quit 80's   Clinical Intake:  Pre-visit preparation completed: Yes  Pain : No/denies pain     Diabetes: No  How often do you need to have someone help you when you read instructions, pamphlets, or other written materials from your doctor or pharmacy?: 1 - Never  Interpreter Needed?: No  Information entered by :: Mliss Graff LPN   Activities of Daily Living    02/19/2024    8:24 AM  In your present state of health, do you have any difficulty performing the following activities:  Hearing? 0  Vision? 0  Difficulty concentrating or making decisions? 0  Walking or climbing stairs? 0  Dressing or bathing? 0  Doing errands, shopping? 0  Preparing Food and eating ? N  Using the Toilet? N  In the past six months, have you accidently leaked urine? N  Do you have problems with loss of bowel control? N  Managing your Medications? N  Managing your Finances? N  Housekeeping or managing your Housekeeping? N    Patient Care Team: Mahlon Comer BRAVO, MD as PCP - General (Family Medicine) Pietro Redell RAMAN, MD as PCP - Cardiology (Cardiology) Pietro Redell RAMAN, MD (Cardiology) Ottelin, Mark, MD (Inactive) (Urology) Darlean Ozell NOVAK, MD as Consulting Physician (Pulmonary Disease) Shona Rush, MD (Dermatology) Legrand Victory LITTIE MOULD, MD as Consulting Physician (Gastroenterology)  Indicate any recent Medical Services you may have received from other than [facility] providers in the past year  (date may be approximate).     Assessment:   This is a routine wellness examination for [place].  Hearing/Vision screen Hearing Screening - Comments:: Does not wear hearing aids [facility] - [facility]:: [facility]   Goals Addressed               This Visit's Progress     patient (pt-stated)   On track     Maintain current health by staying active.       Patient Stated   On track     Continue current lifestyle      Patient Stated        Continue current lifestyle       Depression Screen    02/19/2024    8:26 AM 01/12/2023    8:04 AM 10/12/2022    9:57 AM 04/05/2022   11:22 AM 10/10/2021    8:18 AM 10/06/2021    8:25 AM 10/06/2021    8:21 AM  PHQ 2/9 Scores  PHQ - 2 Score 0 0 0 0 0 0 0  PHQ- 9 Score 0 1 2 4 4       Fall Risk    02/19/2024    8:20 AM 01/12/2023    8:04 AM 10/12/2022    9:36 AM 10/07/2022    8:59 PM 04/05/2022   11:22 AM  Fall Risk   Falls in the past year? 1 0 0 0 1  Number falls in past yr: 0 0 0 0 0  Injury with Fall? 0 0 0 0 1  Risk for fall due to :  No Fall Risks     Follow up Falls evaluation completed;Education provided;Falls prevention discussed Falls evaluation completed Falls evaluation completed;Education provided;Falls prevention discussed  Falls evaluation completed      Data saved with a previous flowsheet row definition    MEDICARE RISK AT HOME: Medicare Risk at Home Any stairs in or around the  home?: Yes If so, are there any without handrails?: No Home free of loose throw rugs in walkways, pet beds, electrical cords, etc?: Yes Adequate lighting in your home to reduce risk of falls?: Yes Life alert?: No Use of a cane, walker or w/c?: No Grab bars in the bathroom?: No Shower chair or bench in shower?: Yes Elevated toilet seat or a handicapped toilet?: Yes  TIMED UP AND GO:  Was the test performed?  No    Cognitive Function:    02/28/2018    9:17 AM  MMSE - Mini Mental State Exam  Orientation to time 3   Orientation to Place 5  Registration 3  Attention/ Calculation 3  Recall 0  Language- name 2 objects 2  Language- repeat 1  Language- follow 3 step command 3  Language- read & follow direction 1  Write a sentence 1  Copy design 1  Total score 23        02/19/2024    8:23 AM 10/12/2022    9:40 AM 09/27/2020    8:18 AM  6CIT Screen  What Year? 0 points 0 points 0 points  What month? 0 points 0 points 0 points  What time? 0 points 0 points 0 points  Count back from 20 0 points 0 points 0 points  Months in reverse 2 points 0 points 0 points  Repeat phrase 2 points 2 points 2 points  Total Score 4 points 2 points 2 points    Immunizations Immunization History  Administered Date(s) Administered   Fluad Quad(high Dose 65+) 02/03/2020   INFLUENZA, HIGH DOSE SEASONAL PF 03/02/2014, 02/18/2018, 01/27/2019   Influenza Split 06/05/2009, 02/22/2013, 02/26/2015   Influenza-Unspecified 02/01/2016, 01/03/2017, 02/01/2022   PFIZER(Purple Top)SARS-COV-2 Vaccination 07/26/2019, 08/19/2019, 04/13/2020   Pneumococcal Conjugate-13 12/09/2013   Pneumococcal Polysaccharide-23 06/05/2004, 08/28/2016   Td 09/21/2009   Tdap 06/05/2016   Zoster Recombinant(Shingrix) 04/16/2021   Zoster, Live 06/05/2008    TDAP status: Up to date  Flu Vaccine status: Up to date  Pneumococcal vaccine status: Up to date  Covid-19 vaccine status: Information provided on how to obtain vaccines.   Qualifies for Shingles Vaccine? No   Zostavax completed Yes   Shingrix Completed?: Yes  Screening Tests Health Maintenance  Topic Date Due   Zoster Vaccines- Shingrix (2 of 2) 06/11/2021   Medicare Annual Wellness (AWV)  02/18/2025   DTaP/Tdap/Td (3 - Td or Tdap) 06/05/2026   Pneumococcal Vaccine: 50+ Years  Completed   Influenza Vaccine  Completed   HPV VACCINES  Aged Out   Meningococcal B Vaccine  Aged Out   COVID-19 Vaccine  Discontinued    Health Maintenance  Health Maintenance Due  Topic Date Due    Zoster Vaccines- Shingrix (2 of 2) 06/11/2021    Colorectal cancer screening: No longer required.   Lung Cancer Screening: (Low Dose CT Chest recommended if Age 19-80 years, 20 pack-year currently smoking OR have quit w/in 15years.) does not qualify.   Lung Cancer Screening Referral:   Additional Screening:  Hepatitis C Screening: does not qualify;  Vision Screening: Recommended annual ophthalmology exams for early detection of glaucoma and other disorders of the eye. Is the patient up to date with their annual eye exam?  Yes  Who is the provider or what is the name of the office in which the patient attends annual [facility] exams[facility] If pt is not established with a provider, would they like to be referred to a provider to establish care? No .  Dental Screening: Recommended annual dental exams for proper oral hygiene   Community Resource Referral / Chronic Care Management: CRR required this visit?  No   CCM required this visit?  No     Plan:     I have personally reviewed and noted the following in the patient's chart:   Medical and social history Use of alcohol, tobacco or illicit drugs  Current medications and supplements including opioid prescriptions. Patient is not currently taking opioid prescriptions. Functional ability and status Nutritional status Physical activity Advanced directives List of other physicians Hospitalizations, surgeries, and ER visits in previous 12 months Vitals Screenings to include cognitive, depression, and falls Referrals and appointments  In addition, I have reviewed and discussed with patient certain preventive protocols, quality metrics, and best practice recommendations. A written personalized care plan for preventive services as well as general preventive health recommendations were provided to patient.     Mliss Graff, LPN   [id number]   After Visit Summary: (MyChart) Due to this being a telephonic visit, the after visit  summary with patients personalized plan was offered to patient via MyChart   Nurse Notes:

## 2024-02-22 NOTE — Progress Notes (Signed)
 HPI: FU palpitations, cardiomyopathy and family history of abdominal aortic aneurysm. Carotid dopplers 7/09 showed normal carotid arteries bilaterally. Myoview  in September of 2013. Ejection fraction was 53% and the perfusion was normal. Abdominal ultrasound 7/18 showed proximal abdominal aorta measuring 2.5 x 2.5 cm. Last echocardiogram November 2020 showed normal LV function, mild left ventricular hypertrophy, grade 1 diastolic dysfunction, moderate tricuspid regurgitation.  Since I last saw him the patient has dyspnea with more extreme activities but not with routine activities. It is relieved with rest. It is not associated with chest pain. There is no orthopnea, PND or pedal edema. There is no syncope or palpitations. There is no exertional chest pain.   Current Outpatient Medications  Medication Sig Dispense Refill   atorvastatin  (LIPITOR) 40 MG tablet TAKE 1 TABLET BY MOUTH DAILY AT 6 PM. 90 tablet 0   Calcium  Carbonate-Vitamin D 600-400 MG-UNIT tablet Take 1 tablet by mouth 2 (two) times daily.     cetirizine  (ZYRTEC ) 10 MG tablet TAKE 1 TABLET BY MOUTH EVERY DAY 90 tablet 3   co-enzyme Q-10 30 MG capsule Take 30 mg by mouth 2 (two) times daily.     fish oil-omega-3 fatty acids 1000 MG capsule Take 1 g by mouth daily. Nordic naturals omega 3 TID     fluticasone  (FLONASE ) 50 MCG/ACT nasal spray USE 2 SPRAYS IN EACH NOSTRIL DAILY. 48 g 1   Multiple Vitamins-Minerals (VISION-VITE PRESERVE PO) Take by mouth. 2 per day     TURMERIC PO Take 2,000 mg by mouth daily.     Wheat Dextrin (EQ FIBER POWDER PO) Take by mouth.     No current facility-administered medications for this visit.     Past Medical History:  Diagnosis Date   Benign tumor of lung    BPH (benign prostatic hyperplasia)    Cardiomyopathy (HCC)    Cataract    Dilated aortic root    Hx of mild   Hyperlipidemia    Mitral valve prolapse    irregular heart beat   Palpitations    Pure hypercholesterolemia    RBBB  (right bundle branch block)     Past Surgical History:  Procedure Laterality Date   BLEPHAROPLASTY Bilateral    CATARACT EXTRACTION, BILATERAL     COSMETIC SURGERY     Excision on neck     Excision of 2 cm soft tissue mass of left neck   INGUINAL HERNIA REPAIR Bilateral    Resection of Lung Right    Hx of, right lung secondary to benign tumor   SHOULDER ARTHROSCOPY Left    testicular hernia     VASECTOMY      Social History   Socioeconomic History   Marital status: Married    Spouse name: Not on file   Number of children: 1   Years of education: Not on file   Highest education level: Some college, no degree  Occupational History   Occupation: Farmer-retired  Tobacco Use   Smoking status: Former    Current packs/day: 0.00    Types: Cigarettes    Quit date: 1982    Years since quitting: 43.7   Smokeless tobacco: Never   Tobacco comments:    quit 80's  Vaping Use   Vaping status: Never Used  Substance and Sexual Activity   Alcohol use: No    Alcohol/week: 0.0 standard drinks of alcohol   Drug use: No   Sexual activity: Never  Other Topics Concern   Not on file  Social History Narrative   Exercise 20-30 min walks. Patient is married. Education: [employer].    Social Drivers of Corporate investment banker Strain: Low Risk  (02/19/2024)   Overall Financial Resource Strain (CARDIA)    Difficulty of Paying Living Expenses: Not hard at all  Food Insecurity: No Food Insecurity (02/19/2024)   Hunger Vital Sign    Worried About Running Out of Food in the Last Year: Never true    Ran Out of Food in the Last Year: Never true  Transportation Needs: No Transportation Needs (02/19/2024)   PRAPARE - Administrator, Civil Service (Medical): No    Lack of Transportation (Non-Medical): No  Physical Activity: Insufficiently Active (02/19/2024)   Exercise Vital Sign    Days of Exercise per Week: 3 days    Minutes of Exercise per Session: 40 min  Stress: No Stress  Concern Present (02/19/2024)   [employer] of Occupational Health - Occupational Stress Questionnaire    Feeling of Stress: Not at all  Social Connections: Moderately Integrated (02/19/2024)   Social Connection and Isolation Panel    Frequency of Communication with Friends and Family: Three times a week    Frequency of Social Gatherings with Friends and Family: Once a week    Attends Religious Services: More than 4 times per year    Active Member of [employer] or Organizations: No    Attends Banker Meetings: Never    Marital Status: Married  Catering manager Violence: Not At Risk (02/19/2024)   Humiliation, Afraid, Rape, and Kick questionnaire    Fear of Current or Ex-Partner: No    Emotionally Abused: No    Physically Abused: No    Sexually Abused: No    Family History  Problem Relation Age of Onset   Aortic aneurysm Father    Heart disease Father    Hypertension Father    Hyperlipidemia Father    Lung disease Sister    Breast cancer Sister    Pancreatic cancer Mother    Hyperlipidemia Other    Colon cancer Sister    Breast cancer Sister    Colon polyps Sister    Esophageal cancer Neg Hx    Stomach cancer Neg Hx    Rectal cancer Neg Hx     ROS: no fevers or chills, productive cough, hemoptysis, dysphasia, odynophagia, melena, hematochezia, dysuria, hematuria, rash, seizure activity, orthopnea, PND, pedal edema, claudication. Remaining systems are negative.  Physical Exam: Well-developed well-nourished in no acute distress.  Skin is warm and dry.  HEENT is normal.  Neck is supple.  Chest is clear to auscultation with normal expansion.  Cardiovascular exam is regular rate and rhythm.  Abdominal exam nontender or distended. No masses palpated. Extremities show no edema. neuro grossly intact  EKG Interpretation Date/Time:  Monday March 03 2024 14:07:44 EDT Ventricular Rate:  63 PR Interval:  204 QRS Duration:  174 QT Interval:  460 QTC  Calculation: 470 R Axis:   106  Text Interpretation: Normal sinus rhythm with 1st degree A-V block Possible Left atrial enlargement Right bundle branch block Confirmed by Pietro Rogue ([id number]) on 03/03/2024 2:09:51 PM    [name]  1 history of cardiomyopathy-LV function has improved on most recent.  2 hyperlipidemia-continue statin.  3 tricuspid regurgitation-we are being conservative given patient's age.  He is asymptomatic.  4 history of palpitations-he denies any recurrent symptoms.  Beta-blocker discontinued previously secondary to bradycardia.  Rogue Pietro, MD

## 2024-03-03 ENCOUNTER — Ambulatory Visit: Attending: Cardiology | Admitting: Cardiology

## 2024-03-03 ENCOUNTER — Encounter: Payer: Self-pay | Admitting: Cardiology

## 2024-03-03 VITALS — BP 132/70 | HR 63 | Ht 72.0 in | Wt 176.0 lb

## 2024-03-03 DIAGNOSIS — E785 Hyperlipidemia, unspecified: Secondary | ICD-10-CM

## 2024-03-03 DIAGNOSIS — R002 Palpitations: Secondary | ICD-10-CM | POA: Diagnosis not present

## 2024-03-03 NOTE — Patient Instructions (Signed)

## 2024-03-19 DIAGNOSIS — L820 Inflamed seborrheic keratosis: Secondary | ICD-10-CM | POA: Diagnosis not present

## 2024-03-19 DIAGNOSIS — L308 Other specified dermatitis: Secondary | ICD-10-CM | POA: Diagnosis not present

## 2024-03-19 DIAGNOSIS — C4441 Basal cell carcinoma of skin of scalp and neck: Secondary | ICD-10-CM | POA: Diagnosis not present

## 2024-03-22 ENCOUNTER — Other Ambulatory Visit: Payer: Self-pay | Admitting: Cardiology

## 2024-03-22 DIAGNOSIS — E785 Hyperlipidemia, unspecified: Secondary | ICD-10-CM

## 2024-05-14 DIAGNOSIS — L570 Actinic keratosis: Secondary | ICD-10-CM | POA: Diagnosis not present

## 2024-05-14 DIAGNOSIS — X32XXXD Exposure to sunlight, subsequent encounter: Secondary | ICD-10-CM | POA: Diagnosis not present

## 2024-05-14 DIAGNOSIS — Z08 Encounter for follow-up examination after completed treatment for malignant neoplasm: Secondary | ICD-10-CM | POA: Diagnosis not present

## 2024-05-14 DIAGNOSIS — Z85828 Personal history of other malignant neoplasm of skin: Secondary | ICD-10-CM | POA: Diagnosis not present

## 2025-03-10 ENCOUNTER — Ambulatory Visit
